# Patient Record
Sex: Female | Born: 1955
Health system: Southern US, Community
[De-identification: ages and names within clinical notes are randomized; demographics above are authoritative.]

## PROBLEM LIST (undated history)

## (undated) DIAGNOSIS — R7303 Prediabetes: Secondary | ICD-10-CM

## (undated) DIAGNOSIS — I10 Essential (primary) hypertension: Secondary | ICD-10-CM

## (undated) DIAGNOSIS — T8859XA Other complications of anesthesia, initial encounter: Secondary | ICD-10-CM

## (undated) DIAGNOSIS — Z8489 Family history of other specified conditions: Secondary | ICD-10-CM

## (undated) DIAGNOSIS — K219 Gastro-esophageal reflux disease without esophagitis: Secondary | ICD-10-CM

## (undated) DIAGNOSIS — Z87442 Personal history of urinary calculi: Secondary | ICD-10-CM

## (undated) DIAGNOSIS — M1990 Unspecified osteoarthritis, unspecified site: Secondary | ICD-10-CM

## (undated) DIAGNOSIS — E669 Obesity, unspecified: Secondary | ICD-10-CM

## (undated) DIAGNOSIS — E785 Hyperlipidemia, unspecified: Secondary | ICD-10-CM

## (undated) DIAGNOSIS — R519 Headache, unspecified: Secondary | ICD-10-CM

## (undated) DIAGNOSIS — T7840XA Allergy, unspecified, initial encounter: Secondary | ICD-10-CM

## (undated) DIAGNOSIS — Z803 Family history of malignant neoplasm of breast: Secondary | ICD-10-CM

## (undated) DIAGNOSIS — M4800 Spinal stenosis, site unspecified: Secondary | ICD-10-CM

## (undated) DIAGNOSIS — J3089 Other allergic rhinitis: Secondary | ICD-10-CM

## (undated) HISTORY — DX: Family history of malignant neoplasm of breast: Z80.3

## (undated) HISTORY — DX: Unspecified osteoarthritis, unspecified site: M19.90

## (undated) HISTORY — DX: Other allergic rhinitis: J30.89

## (undated) HISTORY — PX: VAGINA SURGERY: SHX829

## (undated) HISTORY — DX: Allergy, unspecified, initial encounter: T78.40XA

## (undated) HISTORY — PX: OTHER SURGICAL HISTORY: SHX169

## (undated) HISTORY — DX: Spinal stenosis, site unspecified: M48.00

## (undated) HISTORY — PX: BREAST BIOPSY: SHX20

## (undated) HISTORY — DX: Obesity, unspecified: E66.9

## (undated) HISTORY — DX: Hyperlipidemia, unspecified: E78.5

## (undated) HISTORY — DX: Gastro-esophageal reflux disease without esophagitis: K21.9

## (undated) HISTORY — PX: COLONOSCOPY: SHX174

## (undated) HISTORY — PX: APPENDECTOMY: SHX54

---

## 2011-11-23 DIAGNOSIS — D481 Neoplasm of uncertain behavior of connective and other soft tissue: Secondary | ICD-10-CM | POA: Insufficient documentation

## 2015-07-16 ENCOUNTER — Ambulatory Visit (INDEPENDENT_AMBULATORY_CARE_PROVIDER_SITE_OTHER): Payer: BC Managed Care – PPO | Admitting: Family Medicine

## 2015-07-16 ENCOUNTER — Encounter: Payer: Self-pay | Admitting: Family Medicine

## 2015-07-16 VITALS — BP 144/78 | HR 63 | Ht 64.25 in | Wt 199.0 lb

## 2015-07-16 DIAGNOSIS — Z1231 Encounter for screening mammogram for malignant neoplasm of breast: Secondary | ICD-10-CM | POA: Insufficient documentation

## 2015-07-16 DIAGNOSIS — M4800 Spinal stenosis, site unspecified: Secondary | ICD-10-CM

## 2015-07-16 DIAGNOSIS — Z803 Family history of malignant neoplasm of breast: Secondary | ICD-10-CM

## 2015-07-16 DIAGNOSIS — E669 Obesity, unspecified: Secondary | ICD-10-CM | POA: Diagnosis not present

## 2015-07-16 DIAGNOSIS — K219 Gastro-esophageal reflux disease without esophagitis: Secondary | ICD-10-CM

## 2015-07-16 DIAGNOSIS — M48062 Spinal stenosis, lumbar region with neurogenic claudication: Secondary | ICD-10-CM

## 2015-07-16 DIAGNOSIS — E785 Hyperlipidemia, unspecified: Secondary | ICD-10-CM | POA: Diagnosis not present

## 2015-07-16 DIAGNOSIS — M4806 Spinal stenosis, lumbar region: Secondary | ICD-10-CM

## 2015-07-16 DIAGNOSIS — M1990 Unspecified osteoarthritis, unspecified site: Secondary | ICD-10-CM

## 2015-07-16 DIAGNOSIS — J3089 Other allergic rhinitis: Secondary | ICD-10-CM

## 2015-07-16 NOTE — Progress Notes (Signed)
Marjory Sneddon, D.O. Primary care at [facility]:    Chief Complaint  Patient presents with  . Establish Care   New pt, here to establish care.   HPI: Paula Conway is a pleasant 60 y.o. female who presents to [place] [facility] today To become established.  Patient recently retired from [employer] as a Engineer, manufacturing.   She is on her second marriage, for 11 years.  She is the mother of 2, and grandmother of 2.  She has a mini Daschund- who she states is her spoiled child.  Her husband is the chief of police at [employer].   Patient recently moved here from [facility] and needs a new primary care physician in the area.  She is not sure what a normal blood pressure is or what she can do to prevent it from going up.  She is not sure of what to eat and what not to eat as well. Desires to lose wt.  Realizes it makes her back pain and knee/jt pain worse   Last PAP- 2012- N  Dr Lawerance Cruel- ortho/ PMR Doc in [place], [place]    Past Medical History  Diagnosis Date  . Allergy   . Arthritis   . GERD (gastroesophageal reflux disease)   . Hyperlipidemia   . Spinal stenosis       Past Surgical History  Procedure Laterality Date  . Appendectomy    . Core biopsy        Family History  Problem Relation Age of Onset  . Cancer Mother     breast  . Diabetes Father   . Hypertension Father   . Heart disease Father       History  Drug Use No  ,    History  Alcohol Use  . Yes    Comment: socially  ,    History  Smoking status  . Former Smoker  . Quit date: 01/11/1975  Smokeless tobacco  . Never Used  ,     History  Sexual Activity  . Sexual Activity: Yes      Patient's Medications  New Prescriptions   No medications on file  Previous Medications   GABAPENTIN (NEURONTIN) 600 MG TABLET    Take 300 mg by mouth daily as needed.   GLUCOS-CHOND-HYAL AC-CA FRUCTO (MOVE FREE JOINT HEALTH ADVANCE PO)    Take 1  tablet by mouth daily.   OVER THE COUNTER MEDICATION    Take 1 capsule by mouth daily. Alpha + CRS   OVER THE COUNTER MEDICATION    Take 1 capsule by mouth daily. Food Nutrient Complex   OVER THE COUNTER MEDICATION    Take 1 tablet by mouth daily. Essential Oil Omega Complex  Modified Medications   No medications on file  Discontinued Medications   No medications on file     Review of patient's allergies indicates no known allergies. Outpatient Encounter Prescriptions as of 07/16/2015  Medication Sig Note  . gabapentin (NEURONTIN) 600 MG tablet Take 300 mg by mouth daily as needed. 07/16/2015: Received from: [place]: Take 300 mg by mouth.  . Glucos-Chond-Hyal Ac-Ca Fructo (MOVE FREE JOINT HEALTH ADVANCE PO) Take 1 tablet by mouth daily.   Marland Kitchen OVER THE COUNTER MEDICATION Take 1 capsule by mouth daily. Alpha + CRS   . OVER THE COUNTER MEDICATION Take 1 capsule by mouth daily. Food Nutrient Complex   .  OVER THE COUNTER MEDICATION Take 1 tablet by mouth daily. Essential Oil Omega Complex    No facility-administered encounter medications on file as of 07/16/2015.      There are no preventive care reminders to display for this patient.    There is no immunization history on file for this patient.   <no information>   Fall Risk  07/16/2015  Falls in the past year? No     Depression screen PHQ 2/9 07/16/2015  Decreased Interest 0  Down, Depressed, Hopeless 0  PHQ - 2 Score 0      Review of Systems:   ( Completed via Adult Medical History Intake form today ) General:   Denies fever, chills, appetite changes, unexplained weight loss.  Optho/Auditory:   Denies visual changes, blurred vision/LOV, ringing in ears/ diff hearing Respiratory:   Denies SOB, DOE, cough, wheezing.  Cardiovascular:   Denies chest pain, palpitations, new onset peripheral edema  Gastrointestinal:   Denies nausea, vomiting, diarrhea.  Genitourinary:    Denies dysuria, increased frequency,  flank pain.  Endocrine:     Denies hot or cold intolerance, polyuria, polydipsia. Musculoskeletal:  Denies unexplained myalgias, joint swelling, arthralgias, gait problems.  Skin:  Denies rash, suspicious lesions or new/ changes in moles Neurological:    Denies dizziness, syncope, unexplained weakness, lightheadedness, numbness  Psychiatric/Behavioral:   Denies mood changes, suicidal or homicidal ideations, hallucinations    Objective:   Blood pressure 144/78, pulse 63, height 5' 4.25" (1.632 m), weight 199 lb (90.266 kg). Body mass index is 33.89 kg/(m^2).  General: Well Developed, well nourished, and in no acute distress.  Neuro: Alert and oriented x3, extra-ocular muscles intact, sensation grossly intact.  HEENT: Normocephalic, atraumatic, pupils equal round reactive to light, neck supple, no gross masses, no carotid bruits, no JVD apprec Skin: no gross suspicious lesions or rashes  Cardiac: Regular rate and rhythm, no murmurs rubs or gallops.  Respiratory: Essentially clear to auscultation bilaterally. Not using accessory muscles, speaking in full sentences.  Abdominal: Soft, not grossly distended Musculoskeletal: Ambulates w/o diff, FROM * 4 ext.  Vasc: less 2 sec cap RF, warm and pink  Psych:  No HI/SI, judgement and insight good.    Impression and Recommendations:    The patient was counselled, risk factors were discussed, anticipatory guidance given. Pt was in the office today for 40+ minutes, with over 50% time spent in face to face counseling of various medical concerns and in coordination of care  1. Hyperlipidemia -Counseling done re: lifestyle and dietary modification - COMPLETE METABOLIC PANEL WITH GFR; Future - TSH; Future - Hemoglobin A1c; Future - Lipid panel; Future - Vitamin B12; Future - VITAMIN D 25 Hydroxy (Vit-D Deficiency, Fractures); Future  2. Obesity -Counseling done re: lifestyle and dietary modification - COMPLETE METABOLIC PANEL WITH GFR;  Future - TSH; Future - Hemoglobin A1c; Future - Lipid panel; Future - Vitamin B12; Future - VITAMIN D 25 Hydroxy (Vit-D Deficiency, Fractures); Future  3. Gastroesophageal reflux disease, esophagitis presence not specified Not needing meds, may use OTC occ prn - CBC with Differential/Platelet; Future - COMPLETE METABOLIC PANEL WITH GFR; Future   4. Family history of breast cancer in Mother - CBC with Differential/Platelet; Future 5. Encounter for screening mammogram for breast cancer Importance d/c pt - MM DIGITAL SCREENING BILATERAL; Future  6. Arthritis txed by ortho/ PM&R doc in [place], [place] - CBC with Differential/Platelet; Future - COMPLETE METABOLIC PANEL WITH GFR; Future - TSH; Future - Hemoglobin A1c; Future - Lipid  panel; Future - Vitamin B12; Future - VITAMIN D 25 Hydroxy (Vit-D Deficiency, Fractures); Future  7. Spinal stenosis, lumbar region, with neurogenic claudication - COMPLETE METABOLIC PANEL WITH GFR; Future - TSH; Future - Vitamin B12; Future - VITAMIN D 25 Hydroxy (Vit-D Deficiency, Fractures); Future Please see AVS handed out to patient at the end of our visit for further patient instructions/ counseling done pertaining to today's office visit.  Gross side effects, risk and benefits, and alternatives of medications discussed with patient.  Patient is aware that all medications have potential side effects and we are unable to predict every sideeffect or drug-drug interaction that may occur.  Expresses verbal understanding and consents to current therapy plan and treatment regiment.  Note: This document was prepared using Dragon voice recognition software and may include unintentional dictation errors.

## 2015-07-16 NOTE — Patient Instructions (Addendum)
Top Ten Foods for Health  1. Water Drink at least 8 to 12 cups of water daily. Consume half of your body weight in pounds, is the amount of water in ounces to drink daily.  Ie: a 200lb person = 100 oz water daily  2. Dark Green Vegetables Eat dark green vegetables at least three to four times a week. Good options include broccoli, peppers, brussel sprouts and leafy greens like kale and spinach.  3. Whole Grains Whole grains should be included in your diet at least two to three times daily. Look for whole wheat flour, rye, oatmeal, barley, amaranth, quinoa or a multigrain. A good source of fiber includes 3 to 4 grams of fiber per serving. A great source has 5 or more grams of fiber per serving.  4. Beans and Lentils Try to eat a bean-based meal at least once a week. Try to add legumes, including beans and lentils, to soups, stews, casseroles, salads and dips or eat them plain.  5. Fish Try to eat two to three serving of fish a week. A serving consists of 3 to 4 ounces of cooked fish. Good choices are salmon, trout, herring, bluefish, sardines and tuna.  6. Berries Include two to four servings of fruit in your diet each day. Try to eat berries such as raspberries, blueberries, blackberries and strawberries.  7. Winter Squash Eat butternut and acorn squash as well as other richly pigmented dark orange and green colored vegetables like sweet potato, cantaloupe and mango.  8. Soy 25 grams of soy protein a day is recommended as part of a low-fat diet to help lower cholesterol levels. Try tofu, soymilk, edamame soybeans, tempeh and texturized vegetable protein (TVP).  9. Flaxseed, Nuts and Seeds Add 1 to 2 tablespoons of ground flaxseed or other seeds to food each day or include a moderate amount of nuts - 1/4 cup - in your daily diet.  10. Organic Yogurt Men and women between 38 and 70 years of age need 1000 milligrams of calcium a day and 1200 milligrams if 57 or older. Eat  calcium-rich foods such as nonfat or low-fat dairy products three to four times a day. Include organic choices.         Back Exercises The following exercises strengthen the muscles that help to support the back. They also help to keep the lower back flexible. Doing these exercises can help to prevent back pain or lessen existing pain. If you have back pain or discomfort, try doing these exercises 2-3 times each day or as told by your health care provider. When the pain goes away, do them once each day, but increase the number of times that you repeat the steps for each exercise (do more repetitions). If you do not have back pain or discomfort, do these exercises once each day or as told by your health care provider. EXERCISES Single Knee to Chest Repeat these steps 3-5 times for each leg: 1. Lie on your back on a firm bed or the floor with your legs extended. 2. Bring one knee to your chest. Your other leg should stay extended and in contact with the floor. 3. Hold your knee in place by grabbing your knee or thigh. 4. Pull on your knee until you feel a gentle stretch in your lower back. 5. Hold the stretch for 10-30 seconds. 6. Slowly release and straighten your leg. Pelvic Tilt Repeat these steps 5-10 times: 1. Lie on your back on a  firm bed or the floor with your legs extended. 2. Bend your knees so they are pointing toward the ceiling and your feet are flat on the floor. 3. Tighten your lower abdominal muscles to press your lower back against the floor. This motion will tilt your pelvis so your tailbone points up toward the ceiling instead of pointing to your feet or the floor. 4. With gentle tension and even breathing, hold this position for 5-10 seconds. Cat-Cow Repeat these steps until your lower back becomes more flexible: 1. Get into a hands-and-knees position on a firm surface. Keep your hands under your shoulders, and keep your knees under your hips. You may place padding  under your knees for comfort. 2. Let your head hang down, and point your tailbone toward the floor so your lower back becomes rounded like the back of a cat. 3. Hold this position for 5 seconds. 4. Slowly lift your head and point your tailbone up toward the ceiling so your back forms a sagging arch like the back of a cow. 5. Hold this position for 5 seconds. Press-Ups Repeat these steps 5-10 times: 1. Lie on your abdomen (face-down) on the floor. 2. Place your palms near your head, about shoulder-width apart. 3. While you keep your back as relaxed as possible and keep your hips on the floor, slowly straighten your arms to raise the top half of your body and lift your shoulders. Do not use your back muscles to raise your upper torso. You may adjust the placement of your hands to make yourself more comfortable. 4. Hold this position for 5 seconds while you keep your back relaxed. 5. Slowly return to lying flat on the floor. Bridges Repeat these steps 10 times: 1. Lie on your back on a firm surface. 2. Bend your knees so they are pointing toward the ceiling and your feet are flat on the floor. 3. Tighten your buttocks muscles and lift your buttocks off of the floor until your waist is at almost the same height as your knees. You should feel the muscles working in your buttocks and the back of your thighs. If you do not feel these muscles, slide your feet 1-2 inches farther away from your buttocks. 4. Hold this position for 3-5 seconds. 5. Slowly lower your hips to the starting position, and allow your buttocks muscles to relax completely. If this exercise is too easy, try doing it with your arms crossed over your chest. Abdominal Crunches Repeat these steps 5-10 times: 1. Lie on your back on a firm bed or the floor with your legs extended. 2. Bend your knees so they are pointing toward the ceiling and your feet are flat on the floor. 3. Cross your arms over your chest. 4. Tip your chin slightly  toward your chest without bending your neck. 5. Tighten your abdominal muscles and slowly raise your trunk (torso) high enough to lift your shoulder blades a tiny bit off of the floor. Avoid raising your torso higher than that, because it can put too much stress on your low back and it does not help to strengthen your abdominal muscles. 6. Slowly return to your starting position. Back Lifts Repeat these steps 5-10 times: 1. Lie on your abdomen (face-down) with your arms at your sides, and rest your forehead on the floor. 2. Tighten the muscles in your legs and your buttocks. 3. Slowly lift your chest off of the floor while you keep your hips pressed to the floor. Keep the  back of your head in line with the curve in your back. Your eyes should be looking at the floor. 4. Hold this position for 3-5 seconds. 5. Slowly return to your starting position. SEEK MEDICAL CARE IF:  Your back pain or discomfort gets much worse when you do an exercise.  Your back pain or discomfort does not lessen within 2 hours after you exercise. If you have any of these problems, stop doing these exercises right away. Do not do them again unless your health care provider says that you can. SEEK IMMEDIATE MEDICAL CARE IF:  You develop sudden, severe back pain. If this happens, stop doing the exercises right away. Do not do them again unless your health care provider says that you can.   This information is not intended to replace advice given to you by your health care provider. Make sure you discuss any questions you have with your health care provider.   Document Released: 02/04/2004 Document Revised: 09/17/2014 Document Reviewed: 02/20/2014 Elsevier Interactive Patient Education [employer].

## 2015-07-20 ENCOUNTER — Encounter: Payer: Self-pay | Admitting: Family Medicine

## 2015-07-20 DIAGNOSIS — M159 Polyosteoarthritis, unspecified: Secondary | ICD-10-CM | POA: Insufficient documentation

## 2015-07-20 DIAGNOSIS — Z803 Family history of malignant neoplasm of breast: Secondary | ICD-10-CM

## 2015-07-20 DIAGNOSIS — E785 Hyperlipidemia, unspecified: Secondary | ICD-10-CM | POA: Insufficient documentation

## 2015-07-20 DIAGNOSIS — E669 Obesity, unspecified: Secondary | ICD-10-CM

## 2015-07-20 DIAGNOSIS — K219 Gastro-esophageal reflux disease without esophagitis: Secondary | ICD-10-CM | POA: Insufficient documentation

## 2015-07-20 DIAGNOSIS — M4800 Spinal stenosis, site unspecified: Secondary | ICD-10-CM | POA: Insufficient documentation

## 2015-07-20 DIAGNOSIS — J3089 Other allergic rhinitis: Secondary | ICD-10-CM

## 2015-07-20 HISTORY — DX: Other allergic rhinitis: J30.89

## 2015-07-20 HISTORY — DX: Family history of malignant neoplasm of breast: Z80.3

## 2015-07-20 HISTORY — DX: Obesity, unspecified: E66.9

## 2015-07-21 ENCOUNTER — Other Ambulatory Visit (INDEPENDENT_AMBULATORY_CARE_PROVIDER_SITE_OTHER): Payer: BC Managed Care – PPO

## 2015-07-21 DIAGNOSIS — K219 Gastro-esophageal reflux disease without esophagitis: Secondary | ICD-10-CM

## 2015-07-21 DIAGNOSIS — E669 Obesity, unspecified: Secondary | ICD-10-CM

## 2015-07-21 DIAGNOSIS — Z803 Family history of malignant neoplasm of breast: Secondary | ICD-10-CM

## 2015-07-21 DIAGNOSIS — M1990 Unspecified osteoarthritis, unspecified site: Secondary | ICD-10-CM

## 2015-07-21 DIAGNOSIS — M4800 Spinal stenosis, site unspecified: Secondary | ICD-10-CM

## 2015-07-21 DIAGNOSIS — M48062 Spinal stenosis, lumbar region with neurogenic claudication: Secondary | ICD-10-CM

## 2015-07-21 DIAGNOSIS — E785 Hyperlipidemia, unspecified: Secondary | ICD-10-CM | POA: Diagnosis not present

## 2015-07-22 LAB — COMPLETE METABOLIC PANEL WITH GFR
ALBUMIN: 4.3 g/dL (ref 3.6–5.1)
ALT: 19 U/L (ref 6–29)
AST: 17 U/L (ref 10–35)
Alkaline Phosphatase: 65 U/L (ref 33–130)
BUN: 18 mg/dL (ref 7–25)
CO2: 26 mmol/L (ref 20–31)
Calcium: 9.4 mg/dL (ref 8.6–10.4)
Chloride: 104 mmol/L (ref 98–110)
Creat: 0.9 mg/dL (ref 0.50–0.99)
GFR, EST NON AFRICAN AMERICAN: 70 mL/min (ref >=60)
GFR, Est African American: 80 mL/min (ref >=60)
GLUCOSE: 99 mg/dL (ref 65–99)
Potassium: 4.7 mmol/L (ref 3.5–5.3)
SODIUM: 143 mmol/L (ref 135–146)
TOTAL PROTEIN: 6.9 g/dL (ref 6.1–8.1)
Total Bilirubin: 0.4 mg/dL (ref 0.2–1.2)

## 2015-07-22 LAB — CBC WITH DIFFERENTIAL/PLATELET
BASOS ABS: 72 {cells}/uL (ref 0–200)
BASOS PCT: 1 %
EOS ABS: 144 {cells}/uL (ref 15–500)
EOS PCT: 2 %
HCT: 43.8 % (ref 35.0–45.0)
HEMOGLOBIN: 14.6 g/dL (ref 11.7–15.5)
LYMPHS ABS: 2448 {cells}/uL (ref 850–3900)
Lymphocytes Relative: 34 %
MCH: 31.1 pg (ref 27.0–33.0)
MCHC: 33.3 g/dL (ref 32.0–36.0)
MCV: 93.2 fL (ref 80.0–100.0)
MONOS PCT: 6 %
MPV: 12.4 fL (ref 7.5–12.5)
Monocytes Absolute: 432 cells/uL (ref 200–950)
NEUTROS ABS: 4104 {cells}/uL (ref 1500–7800)
Neutrophils Relative %: 57 %
PLATELETS: 202 10*3/uL (ref 140–400)
RBC: 4.7 MIL/uL (ref 3.80–5.10)
RDW: 13.9 % (ref 11.0–15.0)
WBC: 7.2 10*3/uL (ref 3.8–10.8)

## 2015-07-22 LAB — HEMOGLOBIN A1C
HEMOGLOBIN A1C: 5.9 % — AB (ref <5.7)
MEAN PLASMA GLUCOSE: 123 mg/dL

## 2015-07-22 LAB — LIPID PANEL
CHOLESTEROL: 229 mg/dL — AB (ref 125–200)
HDL: 54 mg/dL (ref >=46)
LDL Cholesterol: 153 mg/dL — ABNORMAL HIGH (ref <130)
TRIGLYCERIDES: 108 mg/dL (ref <150)
Total CHOL/HDL Ratio: 4.2 Ratio (ref <=5.0)
VLDL: 22 mg/dL (ref <30)

## 2015-07-22 LAB — TSH: TSH: 4.67 m[IU]/L — AB

## 2015-07-22 LAB — VITAMIN D 25 HYDROXY (VIT D DEFICIENCY, FRACTURES): Vit D, 25-Hydroxy: 30 ng/mL (ref 30–100)

## 2015-07-22 LAB — VITAMIN B12: Vitamin B-12: 884 pg/mL (ref 200–1100)

## 2015-07-30 ENCOUNTER — Encounter: Payer: Self-pay | Admitting: Family Medicine

## 2015-07-30 ENCOUNTER — Ambulatory Visit (INDEPENDENT_AMBULATORY_CARE_PROVIDER_SITE_OTHER): Payer: BC Managed Care – PPO | Admitting: Family Medicine

## 2015-07-30 VITALS — BP 118/74 | HR 68 | Resp 17 | Wt 198.0 lb

## 2015-07-30 DIAGNOSIS — E7800 Pure hypercholesterolemia, unspecified: Secondary | ICD-10-CM | POA: Diagnosis not present

## 2015-07-30 DIAGNOSIS — R7989 Other specified abnormal findings of blood chemistry: Secondary | ICD-10-CM

## 2015-07-30 DIAGNOSIS — R946 Abnormal results of thyroid function studies: Secondary | ICD-10-CM | POA: Diagnosis not present

## 2015-07-30 DIAGNOSIS — R7303 Prediabetes: Secondary | ICD-10-CM | POA: Diagnosis not present

## 2015-07-30 DIAGNOSIS — E669 Obesity, unspecified: Secondary | ICD-10-CM

## 2015-07-30 NOTE — Assessment & Plan Note (Signed)
Weight loss discussed with patient she will consider going to [employer] otherwise she will return to clinic with me in 4 weeks.  Educational handouts provided

## 2015-07-30 NOTE — Assessment & Plan Note (Signed)
A1c was 5.9 in July 2017. Counseling done, occasional handouts provided. Follow-up 1 month for further counseling to see how she is doing with her healthier habits.

## 2015-07-30 NOTE — Assessment & Plan Note (Signed)
>>  ASSESSMENT AND PLAN FOR ELEVATED LDL CHOLESTEROL LEVEL WRITTEN ON 07/30/2015  1:52 PM BY OPALSKI, DEBORAH, DO  Counseling done, educational handouts provided. Told patient needs to move more

## 2015-07-30 NOTE — Patient Instructions (Signed)
Go to www.heart.org to read more about cholesterol and getting healthy and/or go to www.diabetes.org to learn more about preventing onset and progression to diabetes and how to treat your glucose intolerance / prediabetes    Risk factors for prediabetes and type 2 diabetes Researchers don't fully understand why some people develop prediabetes and type 2 diabetes and others don't.  It's clear that certain factors increase the risk, however, including:  Weight. The more fatty tissue you have, the more resistant your cells become to insulin.  Inactivity. The less active you are, the greater your risk. Physical activity helps you control your weight, uses up glucose as energy and makes your cells more sensitive to insulin.  Family history. Your risk increases if a parent or sibling has type 2 diabetes.  Race. Although it's unclear why, people of certain races - including blacks, Hispanics, American Indians and Asian-Americans - are at higher risk.  Age. Your risk increases as you get older. This may be because you tend to exercise less, lose muscle mass and gain weight as you age. But type 2 diabetes is also increasing dramatically among children, adolescents and younger adults.  Gestational diabetes. If you developed gestational diabetes when you were pregnant, your risk of developing prediabetes and type 2 diabetes later increases. If you gave birth to a baby weighing more than 9 pounds (4 kilograms), you're also at risk of type 2 diabetes.  Polycystic ovary syndrome. For women, having polycystic ovary syndrome - a common condition characterized by irregular menstrual periods, excess hair growth and obesity - increases the risk of diabetes.  High blood pressure. Having blood pressure over 140/90 millimeters of mercury (mm Hg) is linked to an increased risk of type 2 diabetes.  Abnormal cholesterol and triglyceride levels. If you have low levels of high-density lipoprotein (HDL), or "good,"  cholesterol, your risk of type 2 diabetes is higher. Triglycerides are another type of fat carried in the blood. People with high levels of triglycerides have an increased risk of type 2 diabetes. Your doctor can let you know what your cholesterol and triglyceride levels are.    A good guide to good carbs: The glycemic index ---If you have diabetes, or at risk for diabetes, you know all too well that when you eat carbohydrates, your blood sugar goes up. The total amount of carbs you consume at a meal or in a snack mostly determines what your blood sugar will do. But the food itself also plays a role. A serving of white rice has almost the same effect as eating pure table sugar - a quick, high spike in blood sugar. A serving of lentils has a slower, smaller effect.  ---Picking good sources of carbs can help you control your blood sugar and your weight. Even if you don't have diabetes, eating healthier carbohydrate-rich foods can help ward off a host of chronic conditions, from heart disease to various cancers to, well, diabetes.  ---One way to choose foods is with the glycemic index (GI). This tool measures how much a food boosts blood sugar.  The glycemic index rates the effect of a specific amount of a food on blood sugar compared with the same amount of pure glucose. A food with a glycemic index of 28 boosts blood sugar only 28% as much as pure glucose. One with a GI of 95 acts like pure glucose.  High glycemic foods result in a quick spike in insulin and blood sugar (also known as blood glucose). Low glycemic  foods have a slower, smaller effect.   Using the glycemic index Using the glycemic index is easy: choose foods in the low GI category instead of those in the high GI category (see below), and go easy on those in between. Low glycemic index (GI of 55 or less): Most fruits and vegetables, beans, minimally processed grains, pasta, low-fat dairy foods, and nuts.  Moderate glycemic index (GI 56 to  69): White and sweet potatoes, corn, white rice, couscous, breakfast cereals such as Cream of Wheat and Mini Wheats.  High glycemic index (GI of 70 or higher): White bread, rice cakes, most crackers, bagels, cakes, doughnuts, croissants, most packaged breakfast cereals. You can see the values for 100 commons foods and get links to more at www.health.CheapToothpicks.si.  Swaps for lowering glycemic index  Instead of this high-glycemic index food Eat this lower-glycemic index food  White rice Brown rice or converted rice  Instant oatmeal Steel-cut oats  Cornflakes Bran flakes  Baked potato Pasta, bulgur  White bread Whole-grain bread  Corn Peas or leafy greens      Guidelines for a Low Cholesterol, Low Saturated Fat Diet   Fats - Limit total intake of fats and oils. - Avoid butter, stick margarine, shortening, lard, palm and coconut oils. - Limit mayonnaise, salad dressings, gravies and sauces, unless they are homemade with low-fat ingredients. - Limit chocolate. - Choose low-fat and nonfat products, such as low-fat mayonnaise, low-fat or non-hydrogenated peanut butter, low-fat or fat-free salad dressings and nonfat gravy. - Use vegetable oil, such as canola or olive oil. - Look for margarine that does not contain trans fatty acids. - Use nuts in moderate amounts. - Read ingredient labels carefully to determine both amount and type of fat present in foods. Limit saturated and trans fats! - Avoid high-fat processed and convenience foods.  Meats and Meat Alternatives - Choose fish, chicken, [place] and lean meats. - Use dried beans, peas, lentils and tofu. - Limit egg yolks to three to four per week. - If you eat red meat, limit to no more than three servings per week and choose loin or round cuts. - Avoid fatty meats, such as bacon, sausage, franks, luncheon meats and ribs. - Avoid all organ meats, including liver.  Dairy - Choose nonfat or low-fat milk, yogurt and cottage  cheese. - Most cheeses are high in fat. Choose cheeses made from non-fat milk, such as mozzarella and ricotta cheese. - Choose light or fat-free cream cheese and sour cream. - Avoid cream and sauces made with cream.  Fruits and Vegetables - Eat a wide variety of fruits and vegetables. - Use lemon juice, vinegar or "mist" olive oil on vegetables. - Avoid adding sauces, fat or oil to vegetables.  Breads, Cereals and Grains - Choose whole-grain breads, cereals, pastas and rice. - Avoid high-fat snack foods, such as granola, cookies, pies, pastries, doughnuts and croissants.  Cooking Tips - Avoid deep fried foods. - Trim visible fat off meats and remove skin from poultry before cooking. - Bake, broil, boil, poach or roast poultry, fish and lean meats. - Drain and discard fat that drains out of meat as you cook it. - Add little or no fat to foods. - Use vegetable oil sprays to grease pans for cooking or baking. - Steam vegetables. - Use herbs or no-oil marinades to flavor foods.

## 2015-07-30 NOTE — Assessment & Plan Note (Signed)
  Told patient that we can give her low-dose medicine or since she is asymptomatic we can wait and recheck in 6 months along with the other labs.  She wishes to wait 6 months.  She understands that this may hinder her ability to lose weight or feel energetic.

## 2015-07-30 NOTE — Assessment & Plan Note (Signed)
Counseling done, educational handouts provided. Told patient needs to move more

## 2015-07-30 NOTE — Progress Notes (Signed)
Subjective:    Chief Complaint  Patient presents with  . Follow-up    HPI: Paula Conway is a 60 y.o. female who presents to [place] at [facility] today  To review lab work. She is not looking forward to this and does not want to go on any medications.    Past Medical History  Diagnosis Date  . Allergy   . Arthritis   . GERD (gastroesophageal reflux disease)   . Hyperlipidemia   . Spinal stenosis   . Obesity 07/20/2015  . Family history of breast cancer in Mother 07/20/2015    Mother was diagnosed at 61 years old and died age 72 of breast cancer   . Environmental and seasonal allergies 07/20/2015     Past Surgical History  Procedure Laterality Date  . Appendectomy    . Core biopsy       Family History  Problem Relation Age of Onset  . Cancer Mother     breast  . Diabetes Father   . Hypertension Father   . Heart disease Father      History  Drug Use No  ,  History  Alcohol Use  . Yes    Comment: socially  ,  History  Smoking status  . Former Smoker  . Quit date: 01/11/1975  Smokeless tobacco  . Never Used  ,  History  Sexual Activity  . Sexual Activity: Yes      Current Outpatient Prescriptions on File Prior to Visit  Medication Sig Dispense Refill  . gabapentin (NEURONTIN) 600 MG tablet Take 300 mg by mouth daily as needed.    . Glucos-Chond-Hyal Ac-Ca Fructo (MOVE FREE JOINT HEALTH ADVANCE PO) Take 1 tablet by mouth daily.    Marland Kitchen OVER THE COUNTER MEDICATION Take 1 capsule by mouth daily. Alpha + CRS    . OVER THE COUNTER MEDICATION Take 1 capsule by mouth daily. Food Nutrient Complex    . OVER THE COUNTER MEDICATION Take 1 tablet by mouth daily. Essential Oil Omega Complex     No current facility-administered medications on file prior to visit.    No Known Allergies    Review of Systems:  ( Completed via adult medical history intake form today ) General:  Denies fever, chills, appetite changes, unexplained  weight loss.  Respiratory: Denies SOB, DOE, cough, wheezing.  Cardiovascular: Denies chest pain, palpitations.  Gastrointestinal: Denies nausea, vomiting, diarrhea, abdominal pain.  Genitourinary: Denies dysuria, increased frequency, flank pain. Endocrine: Denies hot or cold intolerance, polyuria, polydipsia. Musculoskeletal: Denies myalgias, back pain, joint swelling, arthralgias, gait problems.  Skin: Denies pallor, rash, suspicious lesions.  Neurological: Denies dizziness, seizures, syncope, unexplained weakness, lightheadedness, numbness and headaches.  Psychiatric/Behavioral: Denies mood changes, suicidal or homicidal ideations, hallucinations, sleep disturbances.    Objective:    Blood pressure 118/74, pulse 68, resp. rate 17, weight 198 lb (89.812 kg), SpO2 96 %. Body mass index is 33.72 kg/(m^2). General: Well Developed, well nourished, and in no acute distress.  HEENT: Normocephalic, atraumatic, pupils equal round reactive to light, neck supple, No carotid bruits no JVD Skin: Warm and dry, cap RF less 2 sec Cardiac: Regular rate and rhythm, S1, S2 WNL's, no murmurs rubs or gallops Respiratory: ECTA B/L, Not using accessory muscles, speaking in full sentences. NeuroM-Sk: Ambulates w/o assistance, moves ext * 4 w/o difficulty, sensation grossly intact.  Psych: A and O *3, judgement and insight good.       Recent  Results (from the past 2160 hour(s))  CBC with Differential/Platelet     Status: None   Collection Time: 07/21/15  8:22 AM  Result Value Ref Range   WBC 7.2 3.8 - 10.8 K/uL   RBC 4.70 3.80 - 5.10 MIL/uL   Hemoglobin 14.6 11.7 - 15.5 g/dL   HCT 43.8 35.0 - 45.0 %   MCV 93.2 80.0 - 100.0 fL   MCH 31.1 27.0 - 33.0 pg   MCHC 33.3 32.0 - 36.0 g/dL   RDW 13.9 11.0 - 15.0 %   Platelets 202 140 - 400 K/uL   MPV 12.4 7.5 - 12.5 fL   Neutro Abs 4104 1500 - 7800 cells/uL   Lymphs Abs 2448 850 - 3900 cells/uL   Monocytes Absolute 432 200 - 950 cells/uL   Eosinophils  Absolute 144 15 - 500 cells/uL   Basophils Absolute 72 0 - 200 cells/uL   Neutrophils Relative % 57 %   Lymphocytes Relative 34 %   Monocytes Relative 6 %   Eosinophils Relative 2 %   Basophils Relative 1 %   Smear Review Criteria for review not met     Comment: ** Please note change in unit of measure and reference range(s). **  COMPLETE METABOLIC PANEL WITH GFR     Status: None   Collection Time: 07/21/15  8:22 AM  Result Value Ref Range   Sodium 143 135 - 146 mmol/L   Potassium 4.7 3.5 - 5.3 mmol/L   Chloride 104 98 - 110 mmol/L   CO2 26 20 - 31 mmol/L   Glucose, Bld 99 65 - 99 mg/dL   BUN 18 7 - 25 mg/dL   Creat 0.90 0.50 - 0.99 mg/dL    Comment:   For patients > or = 60 years of age: The upper reference limit for Creatinine is approximately 13% higher for people identified as African-American.      Total Bilirubin 0.4 0.2 - 1.2 mg/dL   Alkaline Phosphatase 65 33 - 130 U/L   AST 17 10 - 35 U/L   ALT 19 6 - 29 U/L   Total Protein 6.9 6.1 - 8.1 g/dL   Albumin 4.3 3.6 - 5.1 g/dL   Calcium 9.4 8.6 - 10.4 mg/dL   GFR, Est African American 80 >=60 mL/min   GFR, Est Non African American 70 >=60 mL/min  TSH     Status: Abnormal   Collection Time: 07/21/15  8:22 AM  Result Value Ref Range   TSH 4.67 (H) mIU/L    Comment:   Reference Range   > or = 20 Years  0.40-4.50   Pregnancy Range First trimester  0.26-2.66 Second trimester 0.55-2.73 Third trimester  0.43-2.91     Hemoglobin A1c     Status: Abnormal   Collection Time: 07/21/15  8:22 AM  Result Value Ref Range   Hgb A1c MFr Bld 5.9 (H) <5.7 %    Comment:   For someone without known diabetes, a hemoglobin A1c value between 5.7% and 6.4% is consistent with prediabetes and should be confirmed with a follow-up test.   For someone with known diabetes, a value <7% indicates that their diabetes is well controlled. A1c targets should be individualized based on duration of diabetes, age, co-morbid conditions and  other considerations.   This assay result is consistent with an increased risk of diabetes.   Currently, no consensus exists regarding use of hemoglobin A1c for diagnosis of diabetes in children.      Mean  Plasma Glucose 123 mg/dL  Lipid panel     Status: Abnormal   Collection Time: 07/21/15  8:22 AM  Result Value Ref Range   Cholesterol 229 (H) 125 - 200 mg/dL   Triglycerides 108 <150 mg/dL   HDL 54 >=46 mg/dL   Total CHOL/HDL Ratio 4.2 <=5.0 Ratio   VLDL 22 <30 mg/dL   LDL Cholesterol 153 (H) <130 mg/dL    Comment:   Total Cholesterol/HDL Ratio:CHD Risk                        Coronary Heart Disease Risk Table                                        Men       Women          1/2 Average Risk              3.4        3.3              Average Risk              5.0        4.4           2X Average Risk              9.6        7.1           3X Average Risk             23.4       11.0 Use the calculated Patient Ratio above and the CHD Risk table  to determine the patient's CHD Risk.   Vitamin B12     Status: None   Collection Time: 07/21/15  8:22 AM  Result Value Ref Range   Vitamin B-12 884 200 - 1100 pg/mL  VITAMIN D 25 Hydroxy (Vit-D Deficiency, Fractures)     Status: None   Collection Time: 07/21/15  8:22 AM  Result Value Ref Range   Vit D, 25-Hydroxy 30 30 - 100 ng/mL    Comment: Vitamin D Status           25-OH Vitamin D        Deficiency                <20 ng/mL        Insufficiency         20 - 29 ng/mL        Optimal             > or = 30 ng/mL   For 25-OH Vitamin D testing on patients on D2-supplementation and patients for whom quantitation of D2 and D3 fractions is required, the QuestAssureD 25-OH VIT D, (D2,D3), LC/MS/MS is recommended: order code [id number] (patients > 2 yrs).         Impression and Recommendations:    The patient was counselled, risk factors were discussed, anticipatory guidance given.   Prediabetes A1c was 5.9 in July 2017. Counseling  done, occasional handouts provided. Follow-up 1 month for further counseling to see how she is doing with her healthier habits.  obese Weight loss discussed with patient she will consider going to Weight Watchers otherwise she will return to clinic with me in 4 weeks.  Educational handouts provided  Elevated TSH  Told patient  that we can give her low-dose medicine or since she is asymptomatic we can wait and recheck in 6 months along with the other labs.  She wishes to wait 6 months.  She understands that this may hinder her ability to lose weight or feel energetic.   Please see AVS handed out to patient at the end of our visit for further patient instructions/ counseling done pertaining to today's office visit.  Gross side effects, risk and benefits, and alternatives of medications discussed with patient.  Patient is aware that all medications have potential side effects and we are unable to predict every sideeffect or drug-drug interaction that may occur.  Expresses verbal understanding and consents to current therapy plan and treatment regiment.  Note: This document was prepared using Dragon voice recognition software and may include unintentional dictation errors.

## 2015-09-01 ENCOUNTER — Other Ambulatory Visit: Payer: Self-pay | Admitting: Family Medicine

## 2015-09-01 ENCOUNTER — Encounter: Payer: Self-pay | Admitting: Family Medicine

## 2015-09-01 ENCOUNTER — Ambulatory Visit (INDEPENDENT_AMBULATORY_CARE_PROVIDER_SITE_OTHER): Payer: BC Managed Care – PPO | Admitting: Family Medicine

## 2015-09-01 VITALS — BP 121/77 | HR 61 | Wt 194.0 lb

## 2015-09-01 DIAGNOSIS — E785 Hyperlipidemia, unspecified: Secondary | ICD-10-CM

## 2015-09-01 DIAGNOSIS — R946 Abnormal results of thyroid function studies: Secondary | ICD-10-CM

## 2015-09-01 DIAGNOSIS — Z803 Family history of malignant neoplasm of breast: Secondary | ICD-10-CM

## 2015-09-01 DIAGNOSIS — R7303 Prediabetes: Secondary | ICD-10-CM

## 2015-09-01 DIAGNOSIS — Z1231 Encounter for screening mammogram for malignant neoplasm of breast: Secondary | ICD-10-CM

## 2015-09-01 DIAGNOSIS — M4800 Spinal stenosis, site unspecified: Secondary | ICD-10-CM

## 2015-09-01 DIAGNOSIS — R7989 Other specified abnormal findings of blood chemistry: Secondary | ICD-10-CM

## 2015-09-01 DIAGNOSIS — E7800 Pure hypercholesterolemia, unspecified: Secondary | ICD-10-CM

## 2015-09-01 DIAGNOSIS — E669 Obesity, unspecified: Secondary | ICD-10-CM

## 2015-09-01 DIAGNOSIS — M1990 Unspecified osteoarthritis, unspecified site: Secondary | ICD-10-CM

## 2015-09-01 NOTE — Assessment & Plan Note (Addendum)
We will check TSH in near future, approximately 6 months from when last checked.   Patient denies fatigue or any hypothyroidism symptoms

## 2015-09-01 NOTE — Patient Instructions (Addendum)
Use stevia as a sugar substitute as much as possible.  Goals: 1 pound per week wt loss.       Risk factors for prediabetes and type 2 diabetes Researchers don't fully understand why some people develop prediabetes and type 2 diabetes and others don't.  It's clear that certain factors increase the risk, however, including:  Weight. The more fatty tissue you have, the more resistant your cells become to insulin.  Inactivity. The less active you are, the greater your risk. Physical activity helps you control your weight, uses up glucose as energy and makes your cells more sensitive to insulin.  Family history. Your risk increases if a parent or sibling has type 2 diabetes.  Race. Although it's unclear why, people of certain races - including blacks, Hispanics, American Indians and Asian-Americans - are at higher risk.  Age. Your risk increases as you get older. This may be because you tend to exercise less, lose muscle mass and gain weight as you age. But type 2 diabetes is also increasing dramatically among children, adolescents and younger adults.  Gestational diabetes. If you developed gestational diabetes when you were pregnant, your risk of developing prediabetes and type 2 diabetes later increases. If you gave birth to a baby weighing more than 9 pounds (4 kilograms), you're also at risk of type 2 diabetes.  Polycystic ovary syndrome. For women, having polycystic ovary syndrome - a common condition characterized by irregular menstrual periods, excess hair growth and obesity - increases the risk of diabetes.  High blood pressure. Having blood pressure over 140/90 millimeters of mercury (mm Hg) is linked to an increased risk of type 2 diabetes.  Abnormal cholesterol and triglyceride levels. If you have low levels of high-density lipoprotein (HDL), or "good," cholesterol, your risk of type 2 diabetes is higher. Triglycerides are another type of fat carried in the blood. People with high levels of  triglycerides have an increased risk of type 2 diabetes. Your doctor can let you know what your cholesterol and triglyceride levels are.    A good guide to good carbs: The glycemic index ---If you have diabetes, or at risk for diabetes, you know all too well that when you eat carbohydrates, your blood sugar goes up. The total amount of carbs you consume at a meal or in a snack mostly determines what your blood sugar will do. But the food itself also plays a role. A serving of white rice has almost the same effect as eating pure table sugar - a quick, high spike in blood sugar. A serving of lentils has a slower, smaller effect.  ---Picking good sources of carbs can help you control your blood sugar and your weight. Even if you don't have diabetes, eating healthier carbohydrate-rich foods can help ward off a host of chronic conditions, from heart disease to various cancers to, well, diabetes.  ---One way to choose foods is with the glycemic index (GI). This tool measures how much a food boosts blood sugar.  The glycemic index rates the effect of a specific amount of a food on blood sugar compared with the same amount of pure glucose. A food with a glycemic index of 28 boosts blood sugar only 28% as much as pure glucose. One with a GI of 95 acts like pure glucose.  High glycemic foods result in a quick spike in insulin and blood sugar (also known as blood glucose). Low glycemic foods have a slower, smaller effect.   Using the glycemic index Using the  glycemic index is easy: choose foods in the low GI category instead of those in the high GI category (see below), and go easy on those in between. Low glycemic index (GI of 55 or less): Most fruits and vegetables, beans, minimally processed grains, pasta, low-fat dairy foods, and nuts.  Moderate glycemic index (GI 56 to 69): White and sweet potatoes, corn, white rice, couscous, breakfast cereals such as Cream of Wheat and Mini Wheats.  High glycemic index  (GI of 70 or higher): White bread, rice cakes, most crackers, bagels, cakes, doughnuts, croissants, most packaged breakfast cereals. You can see the values for 100 commons foods and get links to more at www.health.CheapToothpicks.si.  Swaps for lowering glycemic index  Instead of this high-glycemic index food Eat this lower-glycemic index food  White rice Brown rice or converted rice  Instant oatmeal Steel-cut oats  Cornflakes Bran flakes  Baked potato Pasta, bulgur  White bread Whole-grain bread  Corn Peas or leafy greens

## 2015-09-01 NOTE — Assessment & Plan Note (Addendum)
>>  ASSESSMENT AND PLAN FOR HYPERLIPIDEMIA WRITTEN ON 09/01/2015  9:04 PM BY Travone Georg, DO  Patient has changed a little about her diet. Mainly focusing on low carbohydrate instead of low saturated and Transfats.  Encouraged to read handouts that I provided her last office visit with again.  >>ASSESSMENT AND PLAN FOR ELEVATED LDL CHOLESTEROL LEVEL WRITTEN ON 09/01/2015  9:17 PM BY Alida Greiner, DO  Prudent diet d/c pt

## 2015-09-01 NOTE — Assessment & Plan Note (Addendum)
Patient will continue to monitor weight and calories using my fitness pal APP.   Patient declines going to Weight Watchers and wishes to continue what she is doing. Weight loss encouraged.

## 2015-09-01 NOTE — Progress Notes (Signed)
Impression and Recommendations:    1. Hyperlipidemia   2. Prediabetes   3. Elevated LDL cholesterol level   4. Elevated TSH   5. obese   6. Spinal stenosis, unspecified spinal region   7. Encounter for screening mammogram for breast cancer   8. Family history of breast cancer in Mother   13. Arthritis     Hyperlipidemia Patient has changed a little about her diet. Mainly focusing on low carbohydrate instead of low saturated and Transfats.  Encouraged to read handouts that I provided her last office visit with again.  Arthritis Patient understands weight loss would help her joints/\arthritic pains  obese Patient will continue to monitor weight and calories using my fitness pal APP.   Patient declines going to Weight Watchers and wishes to continue what she is doing. Weight loss encouraged.  Spinal stenosis: Chronic back pain  Told patient if she is not having any pain she does not need to take the Neurontin.  Prediabetes No questions regarding new diagnosis. Patient understands what she needs to do but finds it difficult to be disciplined.  Patient is still drinking sweet tea and still goes to [employer] for late breakfast every Saturday.   Elevated LDL cholesterol level Prudent diet d/c pt  Elevated TSH We will check TSH in near future, approximately 6 months from when last checked.   Patient denies fatigue or any hypothyroidism symptoms   Orders Placed This Encounter  Procedures  . MM Digital Screening  . TSH + free T4  . Hemoglobin A1c  . Lipid panel    Patient's Medications  New Prescriptions   No medications on file  Previous Medications   CHOLECALCIFEROL (VITAMIN D3) 5000 UNITS CAPS    Take 5,000 Units by mouth daily.   GABAPENTIN (NEURONTIN) 600 MG TABLET    Take 300 mg by mouth daily as needed.   GLUCOS-CHOND-HYAL AC-CA FRUCTO (MOVE FREE JOINT HEALTH ADVANCE PO)    Take 1 tablet by mouth daily.   OVER THE COUNTER MEDICATION    Take 1 capsule by  mouth daily. Alpha + CRS   OVER THE COUNTER MEDICATION    Take 1 capsule by mouth daily. Food Nutrient Complex   OVER THE COUNTER MEDICATION    Take 1 tablet by mouth daily. Essential Oil Omega Complex  Modified Medications   No medications on file  Discontinued Medications   No medications on file    Return in about 5 months (around 02/01/2016) for Fasting blood work- for lipid panel. f/up A1c, tsh, chol, wt.  The patient was counseled, risk factors were discussed, anticipatory guidance given.  Gross side effects, risk and benefits, and alternatives of medications discussed with patient.  Patient is aware that all medications have potential side effects and we are unable to predict every side effect or drug-drug interaction that may occur.  Expresses verbal understanding and consents to current therapy plan and treatment regimen.  Please see AVS handed out to patient at the end of our visit for further patient instructions/ counseling done pertaining to today's office visit.    Note: This document was prepared using Dragon voice recognition software and may include unintentional dictation errors.   --------------------------------------------------------------------------------------------------------------------------------------------------------------------------------------------------------------------------------------------    Subjective:    CC:  Chief Complaint  Patient presents with  . Hyperglycemia    HPI: Paula Conway is a 60 y.o. female who presents to [place] at [facility] today for issues as discussed below.  She came in last office visit to review recent fasting labs. She was found to be prediabetic, had an elevated TSH, and also elevated LDL cholesterol. She was given instructions on following a prediabetes diet as well as low saturated and Transfats diet. We discussed weight loss.  She was asked to follow-up in one month from that office  visit to see how her changes in healthy lifestyle are going. Patient is very anti-medicine and wants that to be her last resort.    Using My Fitness Pal to track her foods and calories.  She is not drinking enough water, just can't. Here to discuss healthier habits.  She has been eating healthier and lost 4 lbs in past one mo.  She does the elliptical machine-  3 d/wk- 14 mins; yoga 2d/wk  Has not taken any neurontin since I last saw her-  Takes for Lower Back Pain.  Patient wasn't sure that it was helping her so she decided not to take it.  Declines need for any other medicines- symptoms well-controlled.    Wt Readings from Last 3 Encounters:  09/01/15 194 lb (88 kg)  07/30/15 198 lb (89.8 kg)  07/16/15 199 lb (90.3 kg)   BP Readings from Last 3 Encounters:  09/01/15 121/77  07/30/15 118/74  07/16/15 (!) 144/78   Pulse Readings from Last 3 Encounters:  09/01/15 61  07/30/15 68  07/16/15 63     Patient Active Problem List   Diagnosis Date Noted  . Prediabetes 07/30/2015  . Elevated LDL cholesterol level 07/30/2015  . Elevated TSH 07/30/2015  . obese 07/20/2015  . Family history of breast cancer in Mother 07/20/2015  . GERD (gastroesophageal reflux disease) 07/20/2015  . Arthritis 07/20/2015  . Hyperlipidemia 07/20/2015  . Environmental and seasonal allergies 07/20/2015  . Spinal stenosis: Chronic back pain  07/20/2015  . Encounter for screening mammogram for breast cancer 07/16/2015    Past Medical history, Surgical history, Family history, Social history, Allergies and Medications have been entered into the medical record, reviewed and changed as needed.   Allergies:  No Known Allergies  Review of Systems: No fever/ chills, night sweats, no unintended weight loss, No chest pain, or increased shortness of breath. No N/V/D.  Pertinent positives and negatives noted in HPI above    Objective:   Blood pressure 121/77, pulse 61, weight 194 lb (88 kg).   Body mass index  is 33.04 kg/m.  General: Well Developed, well nourished, appropriate for stated age.  Neuro: Alert and oriented x3, extra-ocular muscles intact, sensation grossly intact.  HEENT: Normocephalic, atraumatic, neck supple   Skin: Warm and dry, no gross rash. Cardiac: RRR, S1 S2,  no murmurs rubs or gallops.  Respiratory: ECTA B/L, Not using accessory muscles, speaking in full sentences-unlabored. Vascular:  No gross lower ext edema, cap RF less 2 sec. Psych: No SI/HI, Insight and judgement good

## 2015-09-01 NOTE — Assessment & Plan Note (Addendum)
No questions regarding new diagnosis. Patient understands what she needs to do but finds it difficult to be disciplined.  Patient is still drinking sweet tea and still goes to [employer] for late breakfast every Saturday.

## 2015-09-01 NOTE — Assessment & Plan Note (Signed)
Prudent diet d/c pt

## 2015-09-01 NOTE — Assessment & Plan Note (Signed)
Told patient if she is not having any pain she does not need to take the Neurontin.

## 2015-09-01 NOTE — Assessment & Plan Note (Signed)
Patient understands weight loss would help her joints/\arthritic pains

## 2015-09-08 ENCOUNTER — Ambulatory Visit
Admission: RE | Admit: 2015-09-08 | Discharge: 2015-09-08 | Disposition: A | Discharge status: Home / Self Care | Payer: BC Managed Care – PPO | Source: Ambulatory Visit | Attending: Family Medicine | Admitting: Family Medicine

## 2015-09-08 DIAGNOSIS — Z1231 Encounter for screening mammogram for malignant neoplasm of breast: Secondary | ICD-10-CM

## 2015-09-21 ENCOUNTER — Ambulatory Visit (INDEPENDENT_AMBULATORY_CARE_PROVIDER_SITE_OTHER): Payer: BC Managed Care – PPO | Admitting: Family Medicine

## 2015-09-21 ENCOUNTER — Encounter: Payer: Self-pay | Admitting: Family Medicine

## 2015-09-21 VITALS — BP 115/80 | HR 78 | Temp 97.5°F | Ht 64.25 in | Wt 194.9 lb

## 2015-09-21 DIAGNOSIS — E669 Obesity, unspecified: Secondary | ICD-10-CM

## 2015-09-21 DIAGNOSIS — R300 Dysuria: Secondary | ICD-10-CM | POA: Diagnosis not present

## 2015-09-21 DIAGNOSIS — R7303 Prediabetes: Secondary | ICD-10-CM

## 2015-09-21 DIAGNOSIS — R6883 Chills (without fever): Secondary | ICD-10-CM

## 2015-09-21 LAB — POCT URINALYSIS DIPSTICK
Bilirubin, UA: NEGATIVE
Blood, UA: NEGATIVE
Glucose, UA: NEGATIVE
Ketones, UA: NEGATIVE
Nitrite, UA: NEGATIVE
PROTEIN UA: NEGATIVE
UROBILINOGEN UA: 0.2
pH, UA: 6

## 2015-09-21 NOTE — Patient Instructions (Signed)
F/up if worse or no change in symptoms.    Cont to drink more water and walk daily to a goal of 39min

## 2015-09-21 NOTE — Progress Notes (Signed)
Impression and Recommendations:    Malodorous Urine  - Plan: POCT urinalysis dipstick w N exam - actually Patient does not have dysuria but just chills and malodorous urine. She's had these symptoms in the past with urinary tract infections, and worried she would get an infection again. - UA clear. Patient reassured. Supportive care discussed.  Prediabetes - Patient A1c was 5.9 on 7\17\17.  - Recheck 4 months from then around mid November   obese - still 194- no wt gain from last OV; encouraged wt loss   Patient's Medications  New Prescriptions   No medications on file  Previous Medications   CHOLECALCIFEROL (VITAMIN D3) 5000 UNITS CAPS    Take 5,000 Units by mouth daily.   GABAPENTIN (NEURONTIN) 600 MG TABLET    Take 300 mg by mouth daily as needed.   GLUCOS-CHOND-HYAL AC-CA FRUCTO (MOVE FREE JOINT HEALTH ADVANCE PO)    Take 1 tablet by mouth daily.   OVER THE COUNTER MEDICATION    Take 1 capsule by mouth daily. Alpha + CRS   OVER THE COUNTER MEDICATION    Take 1 capsule by mouth daily. Food Nutrient Complex   OVER THE COUNTER MEDICATION    Take 1 tablet by mouth daily. Essential Oil Omega Complex  Modified Medications   No medications on file  Discontinued Medications   No medications on file    No Follow-up on file.  The patient was counseled, risk factors were discussed, anticipatory guidance given.  Gross side effects, risk and benefits, and alternatives of medications discussed with patient.  Patient is aware that all medications have potential side effects and we are unable to predict every side effect or drug-drug interaction that may occur.  Expresses verbal understanding and consents to current therapy plan and treatment regimen.  Please see AVS handed out to patient at the end of our visit for further patient instructions/ counseling done pertaining to today's office visit.    Note: This document was prepared using Dragon voice recognition software and may  include unintentional dictation errors.   --------------------------------------------------------------------------------------------------------------------------------------------------------------------------------------------------------------------------------------------    Subjective:    CC:  Chief Complaint  Patient presents with  . Chills    history of UTI    HPI: Paula Conway is a 60 y.o. female who presents to [place] at [facility] today for issues as discussed below.   Onset: 2 days- Sx:  "feels chilly" and malodorous urine are pt's only Sx.   Worsening: no   Symptoms:  Urgency: no  Frequency: no  Hesitancy: no  Hematuria: no  Flank Pain: no  Fever: no    Nausea/Vomiting: no  Pregnant: no STD exposure/history: no Discharge: no Irritants: no Rash: no Red Flags  : no  Recent Antibiotic Usage (last 30 days): no  Symptoms lasting more than seven (7) days: no  More than 3 UTI's last 12 months: no   PMH of 1. DM: no 2. Renal Disease/Calculi: no 3. Urinary Tract Abnormality: no  4. Instrumentation/Trauma: no 5. Immunosuppression: no   Wt Readings from Last 3 Encounters:  09/21/15 194 lb 14.4 oz (88.4 kg)  09/01/15 194 lb (88 kg)  07/30/15 198 lb (89.8 kg)   BP Readings from Last 3 Encounters:  09/01/15 121/77  07/30/15 118/74  07/16/15 (!) 144/78   Pulse Readings from Last 3 Encounters:  09/21/15 78  09/01/15 61  07/30/15 68     Patient Active Problem List   Diagnosis Date Noted  .  Prediabetes 07/30/2015  . Elevated LDL cholesterol level 07/30/2015  . Elevated TSH 07/30/2015  . obese 07/20/2015  . Family history of breast cancer in Mother 07/20/2015  . GERD (gastroesophageal reflux disease) 07/20/2015  . Arthritis 07/20/2015  . Hyperlipidemia 07/20/2015  . Environmental and seasonal allergies 07/20/2015  . Spinal stenosis: Chronic back pain  07/20/2015  . Encounter for screening mammogram for breast cancer  07/16/2015    Past Medical history, Surgical history, Family history, Social history, Allergies and Medications have been entered into the medical record, reviewed and changed as needed.   Allergies:  No Known Allergies  Review of Systems: No fever/ chills, night sweats, no unintended weight loss, No chest pain, or increased shortness of breath. No N/V/D.  Pertinent positives and negatives noted in HPI above    Objective:   Pulse 78, temperature 97.5 F (36.4 C), temperature source Oral, height 5' 4.25" (1.632 m), weight 194 lb 14.4 oz (88.4 kg). Body mass index is 33.19 kg/m. General: Well Developed, well nourished, appropriate for stated age.  Neuro: Alert and oriented x3, extra-ocular muscles intact, sensation grossly intact.  HEENT: Normocephalic, atraumatic, neck supple   Skin: Warm and dry, no gross rash. Cardiac: RRR, S1 S2,  no murmurs rubs or gallops.  Respiratory: ECTA B/L, Not using accessory muscles, speaking in full sentences-unlabored.  No flank TTP. Abd: No G/R/R; + BS * 4, no OM Vascular:  No gross lower ext edema, cap RF less 2 sec. Psych: No HI/SI, judgement and insight good, Euthymic mood. Full Affect.

## 2016-01-27 ENCOUNTER — Encounter: Payer: Self-pay | Admitting: Family Medicine

## 2016-01-27 ENCOUNTER — Ambulatory Visit: Payer: BC Managed Care – PPO | Admitting: Family Medicine

## 2016-02-03 ENCOUNTER — Ambulatory Visit (INDEPENDENT_AMBULATORY_CARE_PROVIDER_SITE_OTHER): Payer: BC Managed Care – PPO | Admitting: Family Medicine

## 2016-02-03 ENCOUNTER — Encounter: Payer: Self-pay | Admitting: Family Medicine

## 2016-02-03 VITALS — BP 131/81 | HR 76 | Ht 64.25 in | Wt 194.1 lb

## 2016-02-03 DIAGNOSIS — R05 Cough: Secondary | ICD-10-CM

## 2016-02-03 DIAGNOSIS — J322 Chronic ethmoidal sinusitis: Secondary | ICD-10-CM

## 2016-02-03 DIAGNOSIS — H6982 Other specified disorders of Eustachian tube, left ear: Secondary | ICD-10-CM | POA: Diagnosis not present

## 2016-02-03 DIAGNOSIS — R059 Cough, unspecified: Secondary | ICD-10-CM

## 2016-02-03 MED ORDER — FLUTICASONE PROPIONATE 50 MCG/ACT NA SUSP: NASAL | 5 refills | Status: DC

## 2016-02-03 MED ORDER — HYDROCOD POLST-CPM POLST ER 10-8 MG/5ML PO SUER: ORAL | 0 refills | Status: DC

## 2016-02-03 MED ORDER — AMOXICILLIN 875 MG PO TABS: 875.0000 mg | ORAL_TABLET | Freq: Two times a day (BID) | ORAL | 0 refills | Status: DC

## 2016-02-03 NOTE — Patient Instructions (Addendum)
-   Mucinex prn - Flonase twice daily-one spray each nostril after you do a Ayr or Milta Deiters med sinus rinse - cough medicine at night -  Start ABX if you dev Fevers and one sided face or ear pain- then fill it

## 2016-02-03 NOTE — Progress Notes (Signed)
Acute Care Office visit  Assessment and plan:  1. Cough   2. ETD (Eustachian tube dysfunction), left   3. Ethmoid sinusitis, unspecified chronicity    Anticipatory guidance and routine counseling done re: condition, txmnt options and need for follow up. All questions of patient's were answered.  - Viral vs Allergic vs Bacterial causes for pt's symptoms reveiwed.    - Supportive care and various OTC medications discussed in addition to any prescribed. - Call or RTC if new symptoms, or if no improvement or worse over next couple days.   - Will consider ABX at that time if sx develop into fevers and one-sided face or ear pain, and worsening.    She will hold off and not get the amoxicillin unless she develops those symptoms.   New Prescriptions   AMOXICILLIN (AMOXIL) 875 MG TABLET    Take 1 tablet (875 mg total) by mouth 2 (two) times daily.   CHLORPHENIRAMINE-HYDROCODONE (TUSSIONEX) 10-8 MG/5ML SUER    5 ML's every 8-12 hours when necessary cough   FLUTICASONE (FLONASE) 50 MCG/ACT NASAL SPRAY    1 spray each nostril twice daily    Gross side effects, risk and benefits, and alternatives of medications discussed with patient.  Patient is aware that all medications have potential side effects and we are unable to predict every sideeffect or drug-drug interaction that may occur.  Expresses verbal understanding and consents to current therapy plan and treatment regiment.  Return if symptoms worsen or fail to improve, for f/up chronic medical issues very near future- told to make appt  for this- missed ones in past.   --->  Patient was last seen on 8\22\17 for chronic care and f/up.  Is suppose to come in for Fasting blood work- for lipid panel,  A1c, tsh and to discuss chol, wt loss in very near future.  Please see AVS handed out to patient at the end of our visit for additional patient instructions/ counseling done pertaining to today's office visit.  Note: This document was prepared  using Dragon voice recognition software and may include unintentional dictation errors.    Subjective:    Chief Complaint  Patient presents with  . Cough    HPI:  Pt presents with URI sx for days.      Babysitting the grandkids- have runny nose, PND and cough.   Husband sick with sinus infection last week,    - cough for two weeks- mostly dry, but once q am spits up phelgm- past 3 days--> small amnout.   C/o rhinorrhea, HA, and cough.     Denies objective F/C, No face pain or ear pain, No N/V/D, No SOB/DIB, No Rash.  Tried- nothing for it.      --> Started diet challenge on this past Monday -  healthy diet---> clensing herself of carbs.   Directed by her yoga instructor.  No dairy, no soy, no potatoes etc.  Mostly veggies.  But had a horrible HA ever since- as she weens off carbs.     Patient Active Problem List   Diagnosis Date Noted  . Prediabetes 07/30/2015  . Elevated LDL cholesterol level 07/30/2015  . Elevated TSH 07/30/2015  . obese 07/20/2015  . Family history of breast cancer in Mother 07/20/2015  . GERD (gastroesophageal reflux disease) 07/20/2015  . Arthritis 07/20/2015  . Hyperlipidemia 07/20/2015  . Environmental and seasonal allergies 07/20/2015  . Spinal stenosis: Chronic back pain  07/20/2015  . Encounter for screening mammogram for  breast cancer 07/16/2015    Past medical history, Surgical history, Family history reviewed and noted below, Social history, Allergies, and Medications have been entered into the medical record, reviewed and changed as needed.   No Known Allergies  Review of Systems: General:   No F/C, wt loss Pulm:   No DIB, pleuritic chest pain Card:  No CP, palpitations Abd:  No n/v/d or pain Ext:  No inc edema from baseline   Objective:   Blood pressure 131/81, pulse 76, height 5' 4.25" (1.632 m), weight 194 lb 1.6 oz (88 kg). Body mass index is 33.06 kg/m. General: Well Developed, well nourished, appropriate for stated age.    Neuro: Alert and oriented x3, extra-ocular muscles intact, sensation grossly intact.  HEENT: Normocephalic, atraumatic, pupils equal round reactive to light, neck supple, no masses, no painful lymphadenopathy, TM's intact B/L, no acute findings. Nares- patent, clear d/c, OP- clear, mild erythema, No TTP sinuses Skin: Warm and dry, no gross rash. Cardiac: RRR, S1 S2,  no murmurs rubs or gallops.  Respiratory: ECTA B/L and A/P, Not using accessory muscles, speaking in full sentences- unlabored. Vascular:  No gross lower ext edema, cap RF less 2 sec. Psych: No HI/SI, judgement and insight good, Euthymic mood. Full Affect.   Patient Care Team    Relationship Specialty Notifications Start End  Mellody Dance, DO PCP - General Family Medicine  07/16/15

## 2016-08-30 ENCOUNTER — Other Ambulatory Visit: Payer: Self-pay | Admitting: Family Medicine

## 2016-08-30 ENCOUNTER — Ambulatory Visit (INDEPENDENT_AMBULATORY_CARE_PROVIDER_SITE_OTHER): Payer: BC Managed Care – PPO | Admitting: Family Medicine

## 2016-08-30 ENCOUNTER — Other Ambulatory Visit (HOSPITAL_COMMUNITY)
Admission: RE | Admit: 2016-08-30 | Discharge: 2016-08-30 | Disposition: A | Discharge status: Home / Self Care | Payer: BC Managed Care – PPO | Source: Ambulatory Visit | Attending: Family Medicine | Admitting: Family Medicine

## 2016-08-30 ENCOUNTER — Encounter: Payer: Self-pay | Admitting: Family Medicine

## 2016-08-30 VITALS — BP 138/77 | HR 69 | Ht 64.25 in | Wt 188.7 lb

## 2016-08-30 DIAGNOSIS — E785 Hyperlipidemia, unspecified: Secondary | ICD-10-CM

## 2016-08-30 DIAGNOSIS — Z114 Encounter for screening for human immunodeficiency virus [HIV]: Secondary | ICD-10-CM | POA: Diagnosis not present

## 2016-08-30 DIAGNOSIS — E669 Obesity, unspecified: Secondary | ICD-10-CM | POA: Diagnosis not present

## 2016-08-30 DIAGNOSIS — R7989 Other specified abnormal findings of blood chemistry: Secondary | ICD-10-CM

## 2016-08-30 DIAGNOSIS — Z1211 Encounter for screening for malignant neoplasm of colon: Secondary | ICD-10-CM | POA: Diagnosis not present

## 2016-08-30 DIAGNOSIS — Z719 Counseling, unspecified: Secondary | ICD-10-CM | POA: Insufficient documentation

## 2016-08-30 DIAGNOSIS — Z1231 Encounter for screening mammogram for malignant neoplasm of breast: Secondary | ICD-10-CM

## 2016-08-30 DIAGNOSIS — Z124 Encounter for screening for malignant neoplasm of cervix: Secondary | ICD-10-CM | POA: Insufficient documentation

## 2016-08-30 DIAGNOSIS — Z0000 Encounter for general adult medical examination without abnormal findings: Secondary | ICD-10-CM

## 2016-08-30 DIAGNOSIS — Z8639 Personal history of other endocrine, nutritional and metabolic disease: Secondary | ICD-10-CM

## 2016-08-30 DIAGNOSIS — R946 Abnormal results of thyroid function studies: Secondary | ICD-10-CM

## 2016-08-30 DIAGNOSIS — R7303 Prediabetes: Secondary | ICD-10-CM

## 2016-08-30 DIAGNOSIS — Z1389 Encounter for screening for other disorder: Secondary | ICD-10-CM | POA: Diagnosis not present

## 2016-08-30 DIAGNOSIS — Z1159 Encounter for screening for other viral diseases: Secondary | ICD-10-CM | POA: Diagnosis not present

## 2016-08-30 DIAGNOSIS — E7800 Pure hypercholesterolemia, unspecified: Secondary | ICD-10-CM | POA: Diagnosis not present

## 2016-08-30 LAB — POC HEMOCCULT BLD/STL (OFFICE/1-CARD/DIAGNOSTIC): Fecal Occult Blood, POC: NEGATIVE

## 2016-08-30 NOTE — Progress Notes (Signed)
Impression and Recommendations:    1. Encounter for wellness examination   2. Health education/counseling   3. Screening for multiple conditions   4. Papanicolaou smear for cervical cancer screening   5. Screening for colon cancer   6. Prediabetes   7. Obesity, Class I, BMI 30-34.9   8. Elevated LDL cholesterol level   9. Elevated TSH   10. Hyperlipidemia, unspecified hyperlipidemia type   11. Need for hepatitis C screening test   12. Screening for HIV without presence of risk factors   13. History of vitamin D deficiency     Please see orders section below and AVS for further details of actions taken during this office visit.  1) Anticipatory Guidance: Discussed importance of wearing a seatbelt while driving, not texting while driving; sunscreen when outside along with yearly skin surveillance; eating a well balanced and modest diet; physical activity at least 25 minutes per day or 150 min/ week of moderate to intense activity.  2) Immunizations / Screenings / Labs:  All immunizations and screenings that patient agrees to, are up-to-date per recommendations or will be updated today.  Patient understands the needs for q 2mo dental and yearly vision screens which pt will schedule independently. Obtain bldwrk when fasting if not already done recently.   3) Weight:   Discussed goal of losing even 5-10% of current body weight which would improve overall feelings of well being and improve objective health data significantly.   Improve nutrient density of diet through increasing intake of fruits and vegetables and decreasing saturated/trans fats, white flour products and refined sugar products.   F-up preventative CPE in 1 year. F/up sooner for chronic care management as discussed and/or prn.    Orders Placed This Encounter  Procedures  . CBC with Differential/Platelet  . Comprehensive metabolic panel  . Hemoglobin A1c  . HIV antibody  . Hepatitis C antibody  . Lipid panel  .  TSH  . VITAMIN D 25 Hydroxy (Vit-D Deficiency, Fractures)  . POC Hemoccult Bld/Stl (1-Cd Office Dx)    Please see orders placed and AVS handed out to patient at the end of our visit for further patient instructions/ counseling done pertaining to today's office visit.     Subjective:    Chief Complaint  Patient presents with  . Annual Exam   CC:  CPE  HPI: Paula Conway is a 61 y.o. female who presents to [place] at [facility] today a yearly health maintenance exam.  Health Maintenance Summary Reviewed and updated, unless pt declines services.  Aspirin: administering 81 mg daily  Colonoscopy:     Was done at [facility] on 6\10\10.  Per patient it was normal.  Was told to repeat in 10 years but patient declines to repeat it ever again.    Tdap: Up to date 2013  Tobacco History Reviewed:   Y   Alcohol:    No concerns, no excessive use  Exercise Habits:   Not meeting AHA guidelines  STD concerns:   None  Drug Use:   None  Birth control method:   n/a Menses regular:     N/a; but last  PAP smear was 5 yrs ago.  N with negative HPV per pt  Lumps or breast concerns: No;  Mammo-   last done 2\87\86.  Was normal.  Patient will call for a repeat.  Breast Cancer Family History:      yes  Bone/ DEXA scan:   N/a  Health Maintenance  Topic Date Due  . PAP SMEAR  01/17/1976  . INFLUENZA VACCINE  08/10/2016  . COLONOSCOPY  08/30/2028 (Originally 01/16/2005)  . Hepatitis C Screening  08/30/2028 (Originally 1955-04-18)  . HIV Screening  08/30/2028 (Originally 01/16/1970)  . MAMMOGRAM  09/07/2017  . TETANUS/TDAP  10/27/2021     Wt Readings from Last 3 Encounters:  08/30/16 188 lb 11.2 oz (85.6 kg)  02/03/16 194 lb 1.6 oz (88 kg)  09/21/15 194 lb 14.4 oz (88.4 kg)   BP Readings from Last 3 Encounters:  08/30/16 138/77  02/03/16 131/81  09/21/15 115/80   Pulse Readings from Last 3 Encounters:  08/30/16 69  02/03/16 76  09/21/15 78     Past Medical  History:  Diagnosis Date  . Allergy   . Arthritis   . Environmental and seasonal allergies 07/20/2015  . Family history of breast cancer in Mother 07/20/2015   Mother was diagnosed at 52 years old and died age 15 of breast cancer   . GERD (gastroesophageal reflux disease)   . Hyperlipidemia   . Obesity 07/20/2015  . Spinal stenosis       Past Surgical History:  Procedure Laterality Date  . APPENDECTOMY    . core biopsy        Family History  Problem Relation Age of Onset  . Cancer Mother        breast  . Diabetes Father   . Hypertension Father   . Heart disease Father       History  Drug Use No  ,   History  Alcohol Use  . Yes    Comment: socially  ,   History  Smoking Status  . Former Smoker  . Quit date: 01/11/1975  Smokeless Tobacco  . Never Used  ,   History  Sexual Activity  . Sexual activity: Yes    Current Outpatient Prescriptions on File Prior to Visit  Medication Sig Dispense Refill  . Cholecalciferol (VITAMIN D3) 5000 units CAPS Take 5,000 Units by mouth daily.    . Glucos-Chond-Hyal Ac-Ca Fructo (MOVE FREE JOINT HEALTH ADVANCE PO) Take 1 tablet by mouth daily.    . Magnesium 250 MG TABS Take 1 tablet by mouth daily.    . Probiotic Product (PROBIOTIC PO) Take 1 tablet by mouth daily.     No current facility-administered medications on file prior to visit.     Allergies: Patient has no known allergies.  Review of Systems: General:   Denies fever, chills, unexplained weight loss.  Optho/Auditory:   Denies visual changes, blurred vision/LOV Respiratory:   Denies SOB, DOE more than baseline levels.  Cardiovascular:   Denies chest pain, palpitations, new onset peripheral edema  Gastrointestinal:   Denies nausea, vomiting, diarrhea.  Genitourinary: Denies dysuria, freq/ urgency, flank pain or discharge from genitals.  Endocrine:     Denies hot or cold intolerance, polyuria, polydipsia. Musculoskeletal:   Denies unexplained myalgias, joint  swelling, unexplained arthralgias, gait problems.  Skin:  Denies rash, suspicious lesions Neurological:     Denies dizziness, unexplained weakness, numbness  Psychiatric/Behavioral:   Denies mood changes, suicidal or homicidal ideations, hallucinations    Objective:    Blood pressure 138/77, pulse 69, height 5' 4.25" (1.632 m), weight 188 lb 11.2 oz (85.6 kg). Body mass index is 32.14 kg/m. General Appearance:    Alert, cooperative, no distress, appears stated age  Head:    Normocephalic, without obvious abnormality, atraumatic  Eyes:    PERRL, conjunctiva/corneas clear, EOM's  intact, fundi    benign, both eyes  Ears:    Normal TM's and external ear canals, both ears  Nose:   Nares normal, septum midline, mucosa normal, no drainage    or sinus tenderness  Throat:   Lips w/o lesion, mucosa moist, and tongue normal; teeth and   gums normal  Neck:   Supple, symmetrical, trachea midline, no adenopathy;    thyroid:  no enlargement/tenderness/nodules; no carotid   bruit or JVD  Back:     Symmetric, no curvature, ROM normal, no CVA tenderness  Lungs:     Clear to auscultation bilaterally, respirations unlabored, no       Wh/ R/ R  Chest Wall:    No tenderness or gross deformity; normal excursion   Heart:    Regular rate and rhythm, S1 and S2 normal, no murmur, rub   or gallop  Breast Exam:    No tenderness, masses, or nipple abnormality b/l; no d/c  Abdomen:     Soft, non-tender, bowel sounds active all four quadrants, NO   G/R/R, no masses, no organomegaly  Genitalia:    Ext genitalia: without lesion, no rash or discharge, No         tenderness;  Cervix: WNL's w/o discharge or lesion;        Adnexa:  No tenderness or palpable masses   Rectal:    Normal to relaxed tone, no masses or tenderness; + ext hem present   guaiac negative stool  Extremities:   Extremities normal, atraumatic, no cyanosis or gross edema  Pulses:   2+ and symmetric all extremities  Skin:   Warm, dry, Skin color,  texture, turgor normal, no obvious rashes or lesions Psych: No HI/SI, judgement and insight good, Euthymic mood. Full Affect.  Neurologic:   CNII-XII intact, normal strength, sensation and reflexes    Throughout

## 2016-08-30 NOTE — Patient Instructions (Addendum)
Please call the [facility] and get your mammogram done as you are due end of this month.  Please make sure you follow up in the near future for fasting blood work.  Preventive Care for Adults, Female  A healthy lifestyle and preventive care can promote health and wellness. Preventive health guidelines for women include the following key practices.   A routine yearly physical is a good way to check with your health care provider about your health and preventive screening. It is a chance to share any concerns and updates on your health and to receive a thorough exam.   Visit your dentist for a routine exam and preventive care every 6 months. Brush your teeth twice a day and floss once a day. Good oral hygiene prevents tooth decay and gum disease.   The frequency of eye exams is based on your age, health, family medical history, use of contact lenses, and other factors. Follow your health care provider's recommendations for frequency of eye exams.   Eat a healthy diet. Foods like vegetables, fruits, whole grains, low-fat dairy products, and lean protein foods contain the nutrients you need without too many calories. Decrease your intake of foods high in solid fats, added sugars, and salt. Eat the right amount of calories for you.Get information about a proper diet from your health care provider, if necessary.   Regular physical exercise is one of the most important things you can do for your health. Most adults should get at least 150 minutes of moderate-intensity exercise (any activity that increases your heart rate and causes you to sweat) each week. In addition, most adults need muscle-strengthening exercises on 2 or more days a week.   Maintain a healthy weight. The body mass index (BMI) is a screening tool to identify possible weight problems. It provides an estimate of body fat based on height and weight. Your health care provider can find your BMI, and can help you achieve  or maintain a healthy weight.For adults 20 years and older:   - A BMI below 18.5 is considered underweight.   - A BMI of 18.5 to 24.9 is normal.   - A BMI of 25 to 29.9 is considered overweight.   - A BMI of 30 and above is considered obese.   Maintain normal blood lipids and cholesterol levels by exercising and minimizing your intake of trans and saturated fats.  Eat a balanced diet with plenty of fruit and vegetables. Blood tests for lipids and cholesterol should begin at age 35 and be repeated every 5 years minimum.  If your lipid or cholesterol levels are high, you are over 40, or you are at high risk for heart disease, you may need your cholesterol levels checked more frequently.Ongoing high lipid and cholesterol levels should be treated with medicines if diet and exercise are not working.   If you smoke, find out from your health care provider how to quit. If you do not use tobacco, do not start.   Lung cancer screening is recommended for adults aged 82-80 years who are at high risk for developing lung cancer because of a history of smoking. A yearly low-dose CT scan of the lungs is recommended for people who have at least a 30-pack-year history of smoking and are a current smoker or have quit within the past 15 years. A pack year of smoking is smoking an average of 1 pack of cigarettes a day for 1 year (for example: 1 pack a day for  30 years or 2 packs a day for 15 years). Yearly screening should continue until the smoker has stopped smoking for at least 15 years. Yearly screening should be stopped for people who develop a health problem that would prevent them from having lung cancer treatment.   If you are pregnant, do not drink alcohol. If you are breastfeeding, be very cautious about drinking alcohol. If you are not pregnant and choose to drink alcohol, do not have more than 1 drink per day. One drink is considered to be 12 ounces (355 mL) of beer, 5 ounces (148 mL) of wine, or 1.5  ounces (44 mL) of liquor.   Avoid use of street drugs. Do not share needles with anyone. Ask for help if you need support or instructions about stopping the use of drugs.   High blood pressure causes heart disease and increases the risk of stroke. Your blood pressure should be checked at least yearly.  Ongoing high blood pressure should be treated with medicines if weight loss and exercise do not work.   If you are 62-29 years old, ask your health care provider if you should take aspirin to prevent strokes.   Diabetes screening involves taking a blood sample to check your fasting blood sugar level. This should be done once every 3 years, after age 12, if you are within normal weight and without risk factors for diabetes. Testing should be considered at a younger age or be carried out more frequently if you are overweight and have at least 1 risk factor for diabetes.   Breast cancer screening is essential preventive care for women. You should practice "breast self-awareness."  This means understanding the normal appearance and feel of your breasts and may include breast self-examination.  Any changes detected, no matter how small, should be reported to a health care provider.  Women in their 56s and 30s should have a clinical breast exam (CBE) by a health care provider as part of a regular health exam every 1 to 3 years.  After age 32, women should have a CBE every year.  Starting at age 10, women should consider having a mammogram (breast X-ray test) every year.  Women who have a family history of breast cancer should talk to their health care provider about genetic screening.  Women at a high risk of breast cancer should talk to their health care providers about having an MRI and a mammogram every year.   -Breast cancer gene (BRCA)-related cancer risk assessment is recommended for women who have family members with BRCA-related cancers. BRCA-related cancers include breast, ovarian, tubal, and  peritoneal cancers. Having family members with these cancers may be associated with an increased risk for harmful changes (mutations) in the breast cancer genes BRCA1 and BRCA2. Results of the assessment will determine the need for genetic counseling and BRCA1 and BRCA2 testing.   The Pap test is a screening test for cervical cancer. A Pap test can show cell changes on the cervix that might become cervical cancer if left untreated. A Pap test is a procedure in which cells are obtained and examined from the lower end of the uterus (cervix).   - Women should have a Pap test starting at age 69.   - Between ages 100 and 39, Pap tests should be repeated every 2 years.   - Beginning at age 4, you should have a Pap test every 3 years as long as the past 3 Pap tests have been normal.   -  Some women have medical problems that increase the chance of getting cervical cancer. Talk to your health care provider about these problems. It is especially important to talk to your health care provider if a new problem develops soon after your last Pap test. In these cases, your health care provider may recommend more frequent screening and Pap tests.   - The above recommendations are the same for women who have or have not gotten the vaccine for human papillomavirus (HPV).   - If you had a hysterectomy for a problem that was not cancer or a condition that could lead to cancer, then you no longer need Pap tests. Even if you no longer need a Pap test, a regular exam is a good idea to make sure no other problems are starting.   - If you are between ages 45 and 82 years, and you have had normal Pap tests going back 10 years, you no longer need Pap tests. Even if you no longer need a Pap test, a regular exam is a good idea to make sure no other problems are starting.   - If you have had past treatment for cervical cancer or a condition that could lead to cancer, you need Pap tests and screening for cancer for at least 20  years after your treatment.   - If Pap tests have been discontinued, risk factors (such as a new sexual partner) need to be reassessed to determine if screening should be resumed.   - The HPV test is an additional test that may be used for cervical cancer screening. The HPV test looks for the virus that can cause the cell changes on the cervix. The cells collected during the Pap test can be tested for HPV. The HPV test could be used to screen women aged 66 years and older, and should be used in women of any age who have unclear Pap test results. After the age of 21, women should have HPV testing at the same frequency as a Pap test.   Colorectal cancer can be detected and often prevented. Most routine colorectal cancer screening begins at the age of 43 years and continues through age 63 years. However, your health care provider may recommend screening at an earlier age if you have risk factors for colon cancer. On a yearly basis, your health care provider may provide home test kits to check for hidden blood in the stool.  Use of a small camera at the end of a tube, to directly examine the colon (sigmoidoscopy or colonoscopy), can detect the earliest forms of colorectal cancer. Talk to your health care provider about this at age 72, when routine screening begins. Direct exam of the colon should be repeated every 5 -10 years through age 59 years, unless early forms of pre-cancerous polyps or small growths are found.   People who are at an increased risk for hepatitis B should be screened for this virus. You are considered at high risk for hepatitis B if:  -You were born in a country where hepatitis B occurs often. Talk with your health care provider about which countries are considered high risk.  - Your parents were born in a high-risk country and you have not received a shot to protect against hepatitis B (hepatitis B vaccine).  - You have HIV or AIDS.  - You use needles to inject street drugs.  - You  live with, or have sex with, someone who has Hepatitis B.  - You get hemodialysis  treatment.  - You take certain medicines for conditions like cancer, organ transplantation, and autoimmune conditions.   Hepatitis C blood testing is recommended for all people born from [birth year] through 1965 and any individual with known risks for hepatitis C.   Practice safe sex. Use condoms and avoid high-risk sexual practices to reduce the spread of sexually transmitted infections (STIs). STIs include gonorrhea, chlamydia, syphilis, trichomonas, herpes, HPV, and human immunodeficiency virus (HIV). Herpes, HIV, and HPV are viral illnesses that have no cure. They can result in disability, cancer, and death. Sexually active women aged 46 years and younger should be checked for chlamydia. Older women with new or multiple partners should also be tested for chlamydia. Testing for other STIs is recommended if you are sexually active and at increased risk.   Osteoporosis is a disease in which the bones lose minerals and strength with aging. This can result in serious bone fractures or breaks. The risk of osteoporosis can be identified using a bone density scan. Women ages 63 years and over and women at risk for fractures or osteoporosis should discuss screening with their health care providers. Ask your health care provider whether you should take a calcium supplement or vitamin D to There are also several preventive steps women can take to avoid osteoporosis and resulting fractures or to keep osteoporosis from worsening. -->Recommendations include:  Eat a balanced diet high in fruits, vegetables, calcium, and vitamins.  Get enough calcium. The recommended total intake of is 1,200 mg daily; for best absorption, if taking supplements, divide doses into 250-500 mg doses throughout the day. Of the two types of calcium, calcium carbonate is best absorbed when taken with food but calcium citrate can be taken on an empty  stomach.  Get enough vitamin D. N[employer] and the [employer] recommend at least 1,000 IU per day for women age 60 and over who are at risk of vitamin D deficiency. Vitamin D deficiency can be caused by inadequate sun exposure (for example, those who live in [place]).  Avoid alcohol and smoking. Heavy alcohol intake (more than 7 drinks per week) increases the risk of falls and hip fracture and women smokers tend to lose bone more rapidly and have lower bone mass than nonsmokers. Stopping smoking is one of the most important changes women can make to improve their health and decrease risk for disease.  Be physically active every day. Weight-bearing exercise (for example, fast walking, hiking, jogging, and weight training) may strengthen bones or slow the rate of bone loss that comes with aging. Balancing and muscle-strengthening exercises can reduce the risk of falling and fracture.  Consider therapeutic medications. Currently, several types of effective drugs are available. Healthcare providers can recommend the type most appropriate for each woman.  Eliminate environmental factors that may contribute to accidents. Falls cause nearly 90% of all osteoporotic fractures, so reducing this risk is an important bone-health strategy. Measures include ample lighting, removing obstructions to walking, using nonskid rugs on floors, and placing mats and/or grab bars in showers.  Be aware of medication side effects. Some common medicines make bones weaker. These include a type of steroid drug called glucocorticoids used for arthritis and asthma, some antiseizure drugs, certain sleeping pills, treatments for endometriosis, and some cancer drugs. An overactive thyroid gland or using too much thyroid hormone for an underactive thyroid can also be a problem. If you are taking these medicines, talk to your doctor about what you can do to help protect your bones.reduce  the rate of  osteoporosis.    Menopause can be associated with physical symptoms and risks. Hormone replacement therapy is available to decrease symptoms and risks. You should talk to your health care provider about whether hormone replacement therapy is right for you.   Use sunscreen. Apply sunscreen liberally and repeatedly throughout the day. You should seek shade when your shadow is shorter than you. Protect yourself by wearing long sleeves, pants, a wide-brimmed hat, and sunglasses year round, whenever you are outdoors.   Once a month, do a whole body skin exam, using a mirror to look at the skin on your back. Tell your health care provider of new moles, moles that have irregular borders, moles that are larger than a pencil eraser, or moles that have changed in shape or color.   -Stay current with required vaccines (immunizations).   Influenza vaccine. All adults should be immunized every year.  Tetanus, diphtheria, and acellular pertussis (Td, Tdap) vaccine. Pregnant women should receive 1 dose of Tdap vaccine during each pregnancy. The dose should be obtained regardless of the length of time since the last dose. Immunization is preferred during the 27th 36th week of gestation. An adult who has not previously received Tdap or who does not know her vaccine status should receive 1 dose of Tdap. This initial dose should be followed by tetanus and diphtheria toxoids (Td) booster doses every 10 years. Adults with an unknown or incomplete history of completing a 3-dose immunization series with Td-containing vaccines should begin or complete a primary immunization series including a Tdap dose. Adults should receive a Td booster every 10 years.  Varicella vaccine. An adult without evidence of immunity to varicella should receive 2 doses or a second dose if she has previously received 1 dose. Pregnant females who do not have evidence of immunity should receive the first dose after pregnancy. This first dose  should be obtained before leaving the health care facility. The second dose should be obtained 4 8 weeks after the first dose.  Human papillomavirus (HPV) vaccine. Females aged 65 26 years who have not received the vaccine previously should obtain the 3-dose series. The vaccine is not recommended for use in pregnant females. However, pregnancy testing is not needed before receiving a dose. If a female is found to be pregnant after receiving a dose, no treatment is needed. In that case, the remaining doses should be delayed until after the pregnancy. Immunization is recommended for any person with an immunocompromised condition through the age of 58 years if she did not get any or all doses earlier. During the 3-dose series, the second dose should be obtained 4 8 weeks after the first dose. The third dose should be obtained 24 weeks after the first dose and 16 weeks after the second dose.  Zoster vaccine. One dose is recommended for adults aged 68 years or older unless certain conditions are present.  Measles, mumps, and rubella (MMR) vaccine. Adults born before 29 generally are considered immune to measles and mumps. Adults born in [birth year] or later should have 1 or more doses of MMR vaccine unless there is a contraindication to the vaccine or there is laboratory evidence of immunity to each of the three diseases. A routine second dose of MMR vaccine should be obtained at least 28 days after the first dose for students attending postsecondary schools, health care workers, or international travelers. People who received inactivated measles vaccine or an unknown type of measles vaccine during 1963 1967 should receive  2 doses of MMR vaccine. People who received inactivated mumps vaccine or an unknown type of mumps vaccine before 1979 and are at high risk for mumps infection should consider immunization with 2 doses of MMR vaccine. For females of childbearing age, rubella immunity should be determined. If there is  no evidence of immunity, females who are not pregnant should be vaccinated. If there is no evidence of immunity, females who are pregnant should delay immunization until after pregnancy. Unvaccinated health care workers born before 30 who lack laboratory evidence of measles, mumps, or rubella immunity or laboratory confirmation of disease should consider measles and mumps immunization with 2 doses of MMR vaccine or rubella immunization with 1 dose of MMR vaccine.  Pneumococcal 13-valent conjugate (PCV13) vaccine. When indicated, a person who is uncertain of her immunization history and has no record of immunization should receive the PCV13 vaccine. An adult aged 60 years or older who has certain medical conditions and has not been previously immunized should receive 1 dose of PCV13 vaccine. This PCV13 should be followed with a dose of pneumococcal polysaccharide (PPSV23) vaccine. The PPSV23 vaccine dose should be obtained at least 8 weeks after the dose of PCV13 vaccine. An adult aged 71 years or older who has certain medical conditions and previously received 1 or more doses of PPSV23 vaccine should receive 1 dose of PCV13. The PCV13 vaccine dose should be obtained 1 or more years after the last PPSV23 vaccine dose.  Pneumococcal polysaccharide (PPSV23) vaccine. When PCV13 is also indicated, PCV13 should be obtained first. All adults aged 12 years and older should be immunized. An adult younger than age 28 years who has certain medical conditions should be immunized. Any person who resides in a nursing home or long-term care facility should be immunized. An adult smoker should be immunized. People with an immunocompromised condition and certain other conditions should receive both PCV13 and PPSV23 vaccines. People with human immunodeficiency virus (HIV) infection should be immunized as soon as possible after diagnosis. Immunization during chemotherapy or radiation therapy should be avoided. Routine use of  PPSV23 vaccine is not recommended for American Indians, [place] Natives, or people younger than 65 years unless there are medical conditions that require PPSV23 vaccine. When indicated, people who have unknown immunization and have no record of immunization should receive PPSV23 vaccine. One-time revaccination 5 years after the first dose of PPSV23 is recommended for people aged 105 64 years who have chronic kidney failure, nephrotic syndrome, asplenia, or immunocompromised conditions. People who received 1 2 doses of PPSV23 before age 56 years should receive another dose of PPSV23 vaccine at age 70 years or later if at least 5 years have passed since the previous dose. Doses of PPSV23 are not needed for people immunized with PPSV23 at or after age 33 years.  Meningococcal vaccine. Adults with asplenia or persistent complement component deficiencies should receive 2 doses of quadrivalent meningococcal conjugate (MenACWY-D) vaccine. The doses should be obtained at least 2 months apart. Microbiologists working with certain meningococcal bacteria, [employer] recruits, people at risk during an outbreak, and people who travel to or live in countries with a high rate of meningitis should be immunized. A first-year college student up through age 76 years who is living in a residence hall should receive a dose if she did not receive a dose on or after her 16th birthday. Adults who have certain high-risk conditions should receive one or more doses of vaccine.  Hepatitis A vaccine. Adults who wish to be  protected from this disease, have certain high-risk conditions, work with hepatitis A-infected animals, work in hepatitis A research labs, or travel to or work in countries with a high rate of hepatitis A should be immunized. Adults who were previously unvaccinated and who anticipate close contact with an international adoptee during the first 60 days after arrival in the [place] from a country with a high rate of  hepatitis A should be immunized.  Hepatitis B vaccine.  Adults who wish to be protected from this disease, have certain high-risk conditions, may be exposed to blood or other infectious body fluids, are household contacts or sex partners of hepatitis B positive people, are clients or workers in certain care facilities, or travel to or work in countries with a high rate of hepatitis B should be immunized.  Haemophilus influenzae type b (Hib) vaccine. A previously unvaccinated person with asplenia or sickle cell disease or having a scheduled splenectomy should receive 1 dose of Hib vaccine. Regardless of previous immunization, a recipient of a hematopoietic stem cell transplant should receive a 3-dose series 6 12 months after her successful transplant. Hib vaccine is not recommended for adults with HIV infection.  Preventive Services / Frequency Ages 34 to 39years  Blood pressure check.** / Every 1 to 2 years.  Lipid and cholesterol check.** / Every 5 years beginning at age 17.  Clinical breast exam.** / Every 3 years for women in their 15s and 99s.  BRCA-related cancer risk assessment.** / For women who have family members with a BRCA-related cancer (breast, ovarian, tubal, or peritoneal cancers).  Pap test.** / Every 2 years from ages 27 through 50. Every 3 years starting at age 48 through age 40 or 75 with a history of 3 consecutive normal Pap tests.  HPV screening.** / Every 3 years from ages 72 through ages 77 to 67 with a history of 3 consecutive normal Pap tests.  Hepatitis C blood test.** / For any individual with known risks for hepatitis C.  Skin self-exam. / Monthly.  Influenza vaccine. / Every year.  Tetanus, diphtheria, and acellular pertussis (Tdap, Td) vaccine.** / Consult your health care provider. Pregnant women should receive 1 dose of Tdap vaccine during each pregnancy. 1 dose of Td every 10 years.  Varicella vaccine.** / Consult your health care provider. Pregnant  females who do not have evidence of immunity should receive the first dose after pregnancy.  HPV vaccine. / 3 doses over 6 months, if 47 and younger. The vaccine is not recommended for use in pregnant females. However, pregnancy testing is not needed before receiving a dose.  Measles, mumps, rubella (MMR) vaccine.** / You need at least 1 dose of MMR if you were born in 1957 or later. You may also need a 2nd dose. For females of childbearing age, rubella immunity should be determined. If there is no evidence of immunity, females who are not pregnant should be vaccinated. If there is no evidence of immunity, females who are pregnant should delay immunization until after pregnancy.  Pneumococcal 13-valent conjugate (PCV13) vaccine.** / Consult your health care provider.  Pneumococcal polysaccharide (PPSV23) vaccine.** / 1 to 2 doses if you smoke cigarettes or if you have certain conditions.  Meningococcal vaccine.** / 1 dose if you are age 70 to 55 years and a Market researcher living in a residence hall, or have one of several medical conditions, you need to get vaccinated against meningococcal disease. You may also need additional booster doses.  Hepatitis A  vaccine.** / Consult your health care provider.  Hepatitis B vaccine.** / Consult your health care provider.  Haemophilus influenzae type b (Hib) vaccine.** / Consult your health care provider.  Ages 76 to 64years  Blood pressure check.** / Every 1 to 2 years.  Lipid and cholesterol check.** / Every 5 years beginning at age 28 years.  Lung cancer screening. / Every year if you are aged 75 80 years and have a 30-pack-year history of smoking and currently smoke or have quit within the past 15 years. Yearly screening is stopped once you have quit smoking for at least 15 years or develop a health problem that would prevent you from having lung cancer treatment.  Clinical breast exam.** / Every year after age 74  years.  BRCA-related cancer risk assessment.** / For women who have family members with a BRCA-related cancer (breast, ovarian, tubal, or peritoneal cancers).  Mammogram.** / Every year beginning at age 77 years and continuing for as long as you are in good health. Consult with your health care provider.  Pap test.** / Every 3 years starting at age 20 years through age 80 or 11 years with a history of 3 consecutive normal Pap tests.  HPV screening.** / Every 3 years from ages 66 years through ages 39 to 74 years with a history of 3 consecutive normal Pap tests.  Fecal occult blood test (FOBT) of stool. / Every year beginning at age 77 years and continuing until age 35 years. You may not need to do this test if you get a colonoscopy every 10 years.  Flexible sigmoidoscopy or colonoscopy.** / Every 5 years for a flexible sigmoidoscopy or every 10 years for a colonoscopy beginning at age 64 years and continuing until age 30 years.  Hepatitis C blood test.** / For all people born from [birth year] through 1965 and any individual with known risks for hepatitis C.  Skin self-exam. / Monthly.  Influenza vaccine. / Every year.  Tetanus, diphtheria, and acellular pertussis (Tdap/Td) vaccine.** / Consult your health care provider. Pregnant women should receive 1 dose of Tdap vaccine during each pregnancy. 1 dose of Td every 10 years.  Varicella vaccine.** / Consult your health care provider. Pregnant females who do not have evidence of immunity should receive the first dose after pregnancy.  Zoster vaccine.** / 1 dose for adults aged 63 years or older.  Measles, mumps, rubella (MMR) vaccine.** / You need at least 1 dose of MMR if you were born in 1957 or later. You may also need a 2nd dose. For females of childbearing age, rubella immunity should be determined. If there is no evidence of immunity, females who are not pregnant should be vaccinated. If there is no evidence of immunity, females who are  pregnant should delay immunization until after pregnancy.  Pneumococcal 13-valent conjugate (PCV13) vaccine.** / Consult your health care provider.  Pneumococcal polysaccharide (PPSV23) vaccine.** / 1 to 2 doses if you smoke cigarettes or if you have certain conditions.  Meningococcal vaccine.** / Consult your health care provider.  Hepatitis A vaccine.** / Consult your health care provider.  Hepatitis B vaccine.** / Consult your health care provider.  Haemophilus influenzae type b (Hib) vaccine.** / Consult your health care provider.  Ages 40 years and over  Blood pressure check.** / Every 1 to 2 years.  Lipid and cholesterol check.** / Every 5 years beginning at age 71 years.  Lung cancer screening. / Every year if you are aged 22 80 years and have  a 30-pack-year history of smoking and currently smoke or have quit within the past 15 years. Yearly screening is stopped once you have quit smoking for at least 15 years or develop a health problem that would prevent you from having lung cancer treatment.  Clinical breast exam.** / Every year after age 60 years.  BRCA-related cancer risk assessment.** / For women who have family members with a BRCA-related cancer (breast, ovarian, tubal, or peritoneal cancers).  Mammogram.** / Every year beginning at age 47 years and continuing for as long as you are in good health. Consult with your health care provider.  Pap test.** / Every 3 years starting at age 77 years through age 63 or 28 years with 3 consecutive normal Pap tests. Testing can be stopped between 65 and 70 years with 3 consecutive normal Pap tests and no abnormal Pap or HPV tests in the past 10 years.  HPV screening.** / Every 3 years from ages 42 years through ages 78 or 74 years with a history of 3 consecutive normal Pap tests. Testing can be stopped between 65 and 70 years with 3 consecutive normal Pap tests and no abnormal Pap or HPV tests in the past 10 years.  Fecal occult  blood test (FOBT) of stool. / Every year beginning at age 49 years and continuing until age 47 years. You may not need to do this test if you get a colonoscopy every 10 years.  Flexible sigmoidoscopy or colonoscopy.** / Every 5 years for a flexible sigmoidoscopy or every 10 years for a colonoscopy beginning at age 24 years and continuing until age 71 years.  Hepatitis C blood test.** / For all people born from [birth year] through 1965 and any individual with known risks for hepatitis C.  Osteoporosis screening.** / A one-time screening for women ages 36 years and over and women at risk for fractures or osteoporosis.  Skin self-exam. / Monthly.  Influenza vaccine. / Every year.  Tetanus, diphtheria, and acellular pertussis (Tdap/Td) vaccine.** / 1 dose of Td every 10 years.  Varicella vaccine.** / Consult your health care provider.  Zoster vaccine.** / 1 dose for adults aged 2 years or older.  Pneumococcal 13-valent conjugate (PCV13) vaccine.** / Consult your health care provider.  Pneumococcal polysaccharide (PPSV23) vaccine.** / 1 dose for all adults aged 79 years and older.  Meningococcal vaccine.** / Consult your health care provider.  Hepatitis A vaccine.** / Consult your health care provider.  Hepatitis B vaccine.** / Consult your health care provider.  Haemophilus influenzae type b (Hib) vaccine.** / Consult your health care provider. ** Family history and personal history of risk and conditions may change your health care provider's recommendations. Document Released: 02/22/2001 Document Revised: 10/17/2012  [facility] Patient Information 2014 [place], [place].   EXERCISE AND DIET:  We recommended that you start or continue a regular exercise program for good health. Regular exercise means any activity that makes your heart beat faster and makes you sweat.  We recommend exercising at least 30 minutes per day at least 3 days a week, preferably 5.  We also recommend a diet low in fat  and sugar / carbohydrates.  Inactivity, poor dietary choices and obesity can cause diabetes, heart attack, stroke, and kidney damage, among others.     ALCOHOL AND SMOKING:  Women should limit their alcohol intake to no more than 7 drinks/beers/glasses of wine (combined, not each!) per week. Moderation of alcohol intake to this level decreases your risk of breast cancer and liver damage.  (  And of course, no recreational drugs are part of a healthy lifestyle.)  Also, you should not be smoking at all or even being exposed to second hand smoke. Most people know smoking can cause cancer, and various heart and lung diseases, but did you know it also contributes to weakening of your bones?  Aging of your skin?  Yellowing of your teeth and nails?   CALCIUM AND VITAMIN D:  Adequate intake of calcium and Vitamin D are recommended.  The recommendations for exact amounts of these supplements seem to change often, but generally speaking 600 mg of calcium (either carbonate or citrate) and 800 units of Vitamin D per day seems prudent. Certain women may benefit from higher intake of Vitamin D.  If you are among these women, your doctor will have told you during your visit.     PAP SMEARS:  Pap smears, to check for cervical cancer or precancers,  have traditionally been done yearly, although recent scientific advances have shown that most women can have pap smears less often.  However, every woman still should have a physical exam from her gynecologist or primary care physician every year. It will include a breast check, inspection of the vulva and vagina to check for abnormal growths or skin changes, a visual exam of the cervix, and then an exam to evaluate the size and shape of the uterus and ovaries.  And after 61 years of age, a rectal exam is indicated to check for rectal cancers. We will also provide age appropriate advice regarding health maintenance, like when you should have certain vaccines, screening for  sexually transmitted diseases, bone density testing, colonoscopy, mammograms, etc.    MAMMOGRAMS:  All women over 103 years old should have a yearly mammogram. Many facilities now offer a "3D" mammogram, which may cost around $50 extra out of pocket. If possible,  we recommend you accept the option to have the 3D mammogram performed.  It both reduces the number of women who will be called back for extra views which then turn out to be normal, and it is better than the routine mammogram at detecting truly abnormal areas.     COLONOSCOPY:  Colonoscopy to screen for colon cancer is recommended for all women at age 76.  We know, you hate the idea of the prep.  We agree, BUT, having colon cancer and not knowing it is worse!!  Colon cancer so often starts as a polyp that can be seen and removed at colonscopy, which can quite literally save your life!  And if your first colonoscopy is normal and you have no family history of colon cancer, most women don't have to have it again for 10 years.  Once every ten years, you can do something that may end up saving your life, right?  We will be happy to help you get it scheduled when you are ready.  Be sure to check your insurance coverage so you understand how much it will cost.  It may be covered as a preventative service at no cost, but you should check your particular policy.

## 2016-08-31 ENCOUNTER — Other Ambulatory Visit: Payer: BC Managed Care – PPO

## 2016-08-31 DIAGNOSIS — E669 Obesity, unspecified: Secondary | ICD-10-CM

## 2016-08-31 DIAGNOSIS — E785 Hyperlipidemia, unspecified: Secondary | ICD-10-CM

## 2016-08-31 DIAGNOSIS — Z1159 Encounter for screening for other viral diseases: Secondary | ICD-10-CM

## 2016-08-31 DIAGNOSIS — R7989 Other specified abnormal findings of blood chemistry: Secondary | ICD-10-CM

## 2016-08-31 DIAGNOSIS — E66811 Obesity, class 1: Secondary | ICD-10-CM

## 2016-08-31 DIAGNOSIS — R7303 Prediabetes: Secondary | ICD-10-CM

## 2016-08-31 DIAGNOSIS — Z114 Encounter for screening for human immunodeficiency virus [HIV]: Secondary | ICD-10-CM

## 2016-08-31 DIAGNOSIS — Z8639 Personal history of other endocrine, nutritional and metabolic disease: Secondary | ICD-10-CM

## 2016-08-31 DIAGNOSIS — E7800 Pure hypercholesterolemia, unspecified: Secondary | ICD-10-CM

## 2016-08-31 NOTE — Addendum Note (Signed)
Addended by: Fonnie Mu on: 08/31/2016 08:42 AM   Modules accepted: Orders

## 2016-09-01 LAB — COMPREHENSIVE METABOLIC PANEL
ALBUMIN: 4.4 g/dL (ref 3.6–4.8)
ALK PHOS: 78 IU/L (ref 39–117)
ALT: 17 IU/L (ref 0–32)
AST: 19 IU/L (ref 0–40)
Albumin/Globulin Ratio: 1.8 (ref 1.2–2.2)
BUN / CREAT RATIO: 17 (ref 12–28)
BUN: 14 mg/dL (ref 8–27)
Bilirubin Total: 0.3 mg/dL (ref 0.0–1.2)
CO2: 22 mmol/L (ref 20–29)
CREATININE: 0.84 mg/dL (ref 0.57–1.00)
Calcium: 9.7 mg/dL (ref 8.7–10.3)
Chloride: 104 mmol/L (ref 96–106)
GFR calc Af Amer: 87 mL/min/{1.73_m2} (ref >59)
GFR calc non Af Amer: 75 mL/min/{1.73_m2} (ref >59)
Globulin, Total: 2.5 g/dL (ref 1.5–4.5)
Glucose: 97 mg/dL (ref 65–99)
Potassium: 4.9 mmol/L (ref 3.5–5.2)
Sodium: 143 mmol/L (ref 134–144)
Total Protein: 6.9 g/dL (ref 6.0–8.5)

## 2016-09-01 LAB — CBC WITH DIFFERENTIAL/PLATELET
BASOS ABS: 0 10*3/uL (ref 0.0–0.2)
Basos: 1 %
EOS (ABSOLUTE): 0.1 10*3/uL (ref 0.0–0.4)
Eos: 2 %
HEMOGLOBIN: 13.7 g/dL (ref 11.1–15.9)
Hematocrit: 41.7 % (ref 34.0–46.6)
Immature Grans (Abs): 0 10*3/uL (ref 0.0–0.1)
Immature Granulocytes: 0 %
LYMPHS ABS: 2 10*3/uL (ref 0.7–3.1)
LYMPHS: 40 %
MCH: 30 pg (ref 26.6–33.0)
MCHC: 32.9 g/dL (ref 31.5–35.7)
MCV: 91 fL (ref 79–97)
MONOCYTES: 7 %
Monocytes Absolute: 0.4 10*3/uL (ref 0.1–0.9)
Neutrophils Absolute: 2.5 10*3/uL (ref 1.4–7.0)
Neutrophils: 50 %
PLATELETS: 248 10*3/uL (ref 150–379)
RBC: 4.56 x10E6/uL (ref 3.77–5.28)
RDW: 14 % (ref 12.3–15.4)
WBC: 5.1 10*3/uL (ref 3.4–10.8)

## 2016-09-01 LAB — HEMOGLOBIN A1C
ESTIMATED AVERAGE GLUCOSE: 120 mg/dL
HEMOGLOBIN A1C: 5.8 % — AB (ref 4.8–5.6)

## 2016-09-01 LAB — VITAMIN D 25 HYDROXY (VIT D DEFICIENCY, FRACTURES): Vit D, 25-Hydroxy: 41.2 ng/mL (ref 30.0–100.0)

## 2016-09-01 LAB — TSH+FREE T4
FREE T4: 1.05 ng/dL (ref 0.82–1.77)
TSH: 4.35 u[IU]/mL (ref 0.450–4.500)

## 2016-09-01 LAB — HIV ANTIBODY (ROUTINE TESTING W REFLEX): HIV SCREEN 4TH GENERATION: NONREACTIVE

## 2016-09-01 LAB — LIPID PANEL
CHOL/HDL RATIO: 4.1 ratio (ref 0.0–4.4)
Cholesterol, Total: 240 mg/dL — ABNORMAL HIGH (ref 100–199)
HDL: 59 mg/dL (ref >39)
LDL CALC: 154 mg/dL — AB (ref 0–99)
TRIGLYCERIDES: 133 mg/dL (ref 0–149)
VLDL Cholesterol Cal: 27 mg/dL (ref 5–40)

## 2016-09-01 LAB — HEPATITIS C ANTIBODY

## 2016-09-02 LAB — CYTOLOGY - PAP
DIAGNOSIS: NEGATIVE
HPV (WINDOPATH): NOT DETECTED

## 2016-09-14 ENCOUNTER — Encounter: Payer: Self-pay | Admitting: Family Medicine

## 2016-09-14 ENCOUNTER — Ambulatory Visit (INDEPENDENT_AMBULATORY_CARE_PROVIDER_SITE_OTHER): Payer: BC Managed Care – PPO | Admitting: Family Medicine

## 2016-09-14 VITALS — BP 128/84 | HR 68 | Ht 64.25 in | Wt 192.3 lb

## 2016-09-14 DIAGNOSIS — E559 Vitamin D deficiency, unspecified: Secondary | ICD-10-CM

## 2016-09-14 DIAGNOSIS — E785 Hyperlipidemia, unspecified: Secondary | ICD-10-CM

## 2016-09-14 DIAGNOSIS — R7302 Impaired glucose tolerance (oral): Secondary | ICD-10-CM

## 2016-09-14 MED ORDER — VITAMIN D (ERGOCALCIFEROL) 1.25 MG (50000 UNIT) PO CAPS: 50000.0000 [IU] | ORAL_CAPSULE | ORAL | 10 refills | Status: DC

## 2016-09-14 NOTE — Patient Instructions (Signed)
   Guidelines for a Low Cholesterol, Low Saturated Fat Diet   Fats - Limit total intake of fats and oils. - Avoid butter, stick margarine, shortening, lard, palm and coconut oils. - Limit mayonnaise, salad dressings, gravies and sauces, unless they are homemade with low-fat ingredients. - Limit chocolate. - Choose low-fat and nonfat products, such as low-fat mayonnaise, low-fat or non-hydrogenated peanut butter, low-fat or fat-free salad dressings and nonfat gravy. - Use vegetable oil, such as canola or olive oil. - Look for margarine that does not contain trans fatty acids. - Use nuts in moderate amounts. - Read ingredient labels carefully to determine both amount and type of fat present in foods. Limit saturated and trans fats! - Avoid high-fat processed and convenience foods.  Meats and Meat Alternatives - Choose fish, chicken, turkey and lean meats. - Use dried beans, peas, lentils and tofu. - Limit egg yolks to three to four per week. - If you eat red meat, limit to no more than three servings per week and choose loin or round cuts. - Avoid fatty meats, such as bacon, sausage, franks, luncheon meats and ribs. - Avoid all organ meats, including liver.  Dairy - Choose nonfat or low-fat milk, yogurt and cottage cheese. - Most cheeses are high in fat. Choose cheeses made from non-fat milk, such as mozzarella and ricotta cheese. - Choose light or fat-free cream cheese and sour cream. - Avoid cream and sauces made with cream.  Fruits and Vegetables - Eat a wide variety of fruits and vegetables. - Use lemon juice, vinegar or "mist" olive oil on vegetables. - Avoid adding sauces, fat or oil to vegetables.  Breads, Cereals and Grains - Choose whole-grain breads, cereals, pastas and rice. - Avoid high-fat snack foods, such as granola, cookies, pies, pastries, doughnuts and croissants.  Cooking Tips - Avoid deep fried foods. - Trim visible fat off meats and remove skin from poultry  before cooking. - Bake, broil, boil, poach or roast poultry, fish and lean meats. - Drain and discard fat that drains out of meat as you cook it. - Add little or no fat to foods. - Use vegetable oil sprays to grease pans for cooking or baking. - Steam vegetables. - Use herbs or no-oil marinades to flavor foods.  

## 2016-09-14 NOTE — Progress Notes (Signed)
Assessment and plan:  1. Glucose intolerance (impaired glucose tolerance)   2. Hyperlipidemia, unspecified hyperlipidemia type   3. Vitamin D deficiency     No problem-specific Assessment & Plan notes found for this encounter.     Meds ordered this encounter  Medications  . Vitamin D, Ergocalciferol, (DRISDOL) 50000 units CAPS capsule    Sig: Take 1 capsule (50,000 Units total) by mouth every 7 (seven) days.    Dispense:  12 capsule    Refill:  10    Discontinued Medications   No medications on file    Modified Medications   No medications on file     No orders of the defined types were placed in this encounter.    Return in about 6 months (around 03/14/2017).  Anticipatory guidance and routine counseling done re: condition, txmnt options and need for follow up. All questions of patient's were answered.   Gross side effects, risk and benefits, and alternatives of medications discussed with patient.  Patient is aware that all medications have potential side effects and we are unable to predict every sideeffect or drug-drug interaction that may occur.  Expresses verbal understanding and consents to current therapy plan and treatment regiment.  Please see AVS handed out to patient at the end of our visit for additional patient instructions/ counseling done pertaining to today's office visit.  Note: This document was prepared using Dragon voice recognition software and may include unintentional dictation errors.   ----------------------------------------------------------------------------------------------------------------------  Subjective:   CC:   Paula Conway is a 61 y.o. female who presents to [place] at [facility] today for review and discussion of recent bloodwork that was done.  1. All recent blood work that we ordered was reviewed with patient today.  Patient was  counseled on all abnormalities and we discussed dietary and lifestyle changes that could help those values (also medications when appropriate).  Extensive health counseling performed and all patient's concerns/ questions were addressed.     Wt Readings from Last 3 Encounters:  09/14/16 192 lb 4.8 oz (87.2 kg)  08/30/16 188 lb 11.2 oz (85.6 kg)  02/03/16 194 lb 1.6 oz (88 kg)   BP Readings from Last 3 Encounters:  09/14/16 128/84  08/30/16 138/77  02/03/16 131/81   Pulse Readings from Last 3 Encounters:  09/14/16 68  08/30/16 69  02/03/16 76   BMI Readings from Last 3 Encounters:  09/14/16 32.75 kg/m  08/30/16 32.14 kg/m  02/03/16 33.06 kg/m     Patient Care Team    Relationship Specialty Notifications Start End  Mellody Dance, DO PCP - General Family Medicine  07/16/15     Full medical history updated and reviewed in the office today  Patient Active Problem List   Diagnosis Date Noted  . Obesity, Class I, BMI 30-34.9 08/30/2016    Priority: High  . Prediabetes 07/30/2015    Priority: High  . Elevated LDL cholesterol level 07/30/2015    Priority: High  . Hyperlipidemia 07/20/2015    Priority: High  . Family history of breast cancer in Mother 07/20/2015    Priority: Medium  . GERD (gastroesophageal reflux disease) 07/20/2015    Priority: Medium  . Generalized OA 07/20/2015    Priority: Low  . Environmental and seasonal allergies 07/20/2015    Priority: Low  . Spinal stenosis: Chronic back pain  07/20/2015    Priority: Low  . Glucose intolerance (impaired glucose tolerance) 09/14/2016  . Health  education/counseling 08/30/2016  . Elevated TSH 07/30/2015  . obese 07/20/2015  . Encounter for screening mammogram for breast cancer 07/16/2015  . Neoplasm of connective tissue 11/23/2011    Past Medical History:  Diagnosis Date  . Allergy   . Arthritis   . Environmental and seasonal allergies 07/20/2015  . Family history of breast cancer in Mother 07/20/2015    Mother was diagnosed at 47 years old and died age 62 of breast cancer   . GERD (gastroesophageal reflux disease)   . Hyperlipidemia   . Obesity 07/20/2015  . Spinal stenosis     Past Surgical History:  Procedure Laterality Date  . APPENDECTOMY    . core biopsy      Social History  Substance Use Topics  . Smoking status: Former Smoker    Quit date: 01/11/1975  . Smokeless tobacco: Never Used  . Alcohol use Yes     Comment: socially    Family Hx: Family History  Problem Relation Age of Onset  . Cancer Mother        breast  . Diabetes Father   . Hypertension Father   . Heart disease Father      Medications: Current Outpatient Prescriptions  Medication Sig Dispense Refill  . Ascorbic Acid (VITAMIN C) 1000 MG tablet Take 1,000 mg by mouth daily.    . Cholecalciferol (VITAMIN D3) 5000 units CAPS Take 5,000 Units by mouth daily.    . Glucos-Chond-Hyal Ac-Ca Fructo (MOVE FREE JOINT HEALTH ADVANCE PO) Take 1 tablet by mouth daily.    . Magnesium 250 MG TABS Take 1 tablet by mouth daily.    . Probiotic Product (PROBIOTIC PO) Take 1 tablet by mouth daily.    . vitamin B-12 (CYANOCOBALAMIN) 250 MCG tablet Take 250 mcg by mouth daily.    . Vitamin D, Ergocalciferol, (DRISDOL) 50000 units CAPS capsule Take 1 capsule (50,000 Units total) by mouth every 7 (seven) days. 12 capsule 10   No current facility-administered medications for this visit.     Allergies:  No Known Allergies   Review of Systems: General:   No F/C, wt loss Pulm:   No DIB, SOB, pleuritic chest pain Card:  No CP, palpitations Abd:  No n/v/d or pain Ext:  No inc edema from baseline  Objective:  Blood pressure 128/84, pulse 68, height 5' 4.25" (1.632 m), weight 192 lb 4.8 oz (87.2 kg). Body mass index is 32.75 kg/m. Gen:   Well NAD, A and O *3 HEENT:    [place]/AT, EOMI,  MMM Lungs:   Normal work of breathing. CTA B/L, no Wh, rhonchi Heart:   RRR, S1, S2 WNL's, no MRG Abd:   No gross distention Exts:     warm, pink,  Brisk capillary refill, warm and well perfused.  Psych:    No HI/SI, judgement and insight good, Euthymic mood. Full Affect.   Recent Results (from the past 2160 hour(s))  Cytology - PAP     Status: None   Collection Time: 08/30/16 12:00 AM  Result Value Ref Range   Adequacy      Satisfactory for evaluation  endocervical/transformation zone component PRESENT.   Diagnosis      NEGATIVE FOR INTRAEPITHELIAL LESIONS OR MALIGNANCY.   HPV NOT DETECTED     Comment: Normal Reference Range - NOT Detected   Material Submitted CervicoVaginal Pap [ThinPrep Imaged]    CYTOLOGY - PAP PAP RESULT   POC Hemoccult Bld/Stl (1-Cd Office Dx)     Status: Normal  Collection Time: 08/30/16  2:00 PM  Result Value Ref Range   Card #1 Date 08/30/2016    Fecal Occult Blood, POC Negative Negative  TSH + free T4     Status: None   Collection Time: 08/31/16  8:40 AM  Result Value Ref Range   TSH 4.350 0.450 - 4.500 uIU/mL   Free T4 1.05 0.82 - 1.77 ng/dL  CBC with Differential/Platelet     Status: None   Collection Time: 08/31/16  8:40 AM  Result Value Ref Range   WBC 5.1 3.4 - 10.8 x10E3/uL   RBC 4.56 3.77 - 5.28 x10E6/uL   Hemoglobin 13.7 11.1 - 15.9 g/dL   Hematocrit 41.7 34.0 - 46.6 %   MCV 91 79 - 97 fL   MCH 30.0 26.6 - 33.0 pg   MCHC 32.9 31.5 - 35.7 g/dL   RDW 14.0 12.3 - 15.4 %   Platelets 248 150 - 379 x10E3/uL   Neutrophils 50 Not Estab. %   Lymphs 40 Not Estab. %   Monocytes 7 Not Estab. %   Eos 2 Not Estab. %   Basos 1 Not Estab. %   Neutrophils Absolute 2.5 1.4 - 7.0 x10E3/uL   Lymphocytes Absolute 2.0 0.7 - 3.1 x10E3/uL   Monocytes Absolute 0.4 0.1 - 0.9 x10E3/uL   EOS (ABSOLUTE) 0.1 0.0 - 0.4 x10E3/uL   Basophils Absolute 0.0 0.0 - 0.2 x10E3/uL   Immature Granulocytes 0 Not Estab. %   Immature Grans (Abs) 0.0 0.0 - 0.1 x10E3/uL  Comprehensive metabolic panel     Status: None   Collection Time: 08/31/16  8:40 AM  Result Value Ref Range   Glucose 97 65 - 99 mg/dL    BUN 14 8 - 27 mg/dL   Creatinine, Ser 0.84 0.57 - 1.00 mg/dL   GFR calc non Af Amer 75 >59 mL/min/1.73   GFR calc Af Amer 87 >59 mL/min/1.73   BUN/Creatinine Ratio 17 12 - 28   Sodium 143 134 - 144 mmol/L   Potassium 4.9 3.5 - 5.2 mmol/L   Chloride 104 96 - 106 mmol/L   CO2 22 20 - 29 mmol/L   Calcium 9.7 8.7 - 10.3 mg/dL   Total Protein 6.9 6.0 - 8.5 g/dL   Albumin 4.4 3.6 - 4.8 g/dL   Globulin, Total 2.5 1.5 - 4.5 g/dL   Albumin/Globulin Ratio 1.8 1.2 - 2.2   Bilirubin Total 0.3 0.0 - 1.2 mg/dL   Alkaline Phosphatase 78 39 - 117 IU/L   AST 19 0 - 40 IU/L   ALT 17 0 - 32 IU/L  Hemoglobin A1c     Status: Abnormal   Collection Time: 08/31/16  8:40 AM  Result Value Ref Range   Hgb A1c MFr Bld 5.8 (H) 4.8 - 5.6 %    Comment:          Prediabetes: 5.7 - 6.4          Diabetes: >6.4          Glycemic control for adults with diabetes: <7.0    Est. average glucose Bld gHb Est-mCnc 120 mg/dL  HIV antibody     Status: None   Collection Time: 08/31/16  8:40 AM  Result Value Ref Range   HIV Screen 4th Generation wRfx Non Reactive Non Reactive  Hepatitis C antibody     Status: None   Collection Time: 08/31/16  8:40 AM  Result Value Ref Range   Hep C Virus Ab <0.1 0.0 - 0.9 s/co  ratio    Comment:                                   Negative:     < 0.8                              Indeterminate: 0.8 - 0.9                                   Positive:     > 0.9  The CDC recommends that a positive HCV antibody result  be followed up with a HCV Nucleic Acid Amplification  test (433295).   VITAMIN D 25 Hydroxy (Vit-D Deficiency, Fractures)     Status: None   Collection Time: 08/31/16  8:40 AM  Result Value Ref Range   Vit D, 25-Hydroxy 41.2 30.0 - 100.0 ng/mL    Comment: Vitamin D deficiency has been defined by the [place] practice guideline as a level of serum 25-OH vitamin D less than 20 ng/mL (1,2). The Endocrine Society went on to further define  vitamin D insufficiency as a level between 21 and 29 ng/mL (2). 1. IOM (Institute of Medicine). 2010. Dietary reference    intakes for calcium and D. [place]: The    [employer]. 2. Holick MF, Binkley [place], Bischoff-Ferrari HA, et al.    Evaluation, treatment, and prevention of vitamin D    deficiency: an Endocrine Society clinical practice    guideline. JCEM. 2011 Jul; 96(7):1911-30.   Lipid panel     Status: Abnormal   Collection Time: 08/31/16  8:44 AM  Result Value Ref Range   Cholesterol, Total 240 (H) 100 - 199 mg/dL   Triglycerides 133 0 - 149 mg/dL   HDL 59 >39 mg/dL   VLDL Cholesterol Cal 27 5 - 40 mg/dL   LDL Calculated 154 (H) 0 - 99 mg/dL   Chol/HDL Ratio 4.1 0.0 - 4.4 ratio    Comment:                                   T. Chol/HDL Ratio                                             Men  Women                               1/2 Avg.Risk  3.4    3.3                                   Avg.Risk  5.0    4.4                                2X Avg.Risk  9.6    7.1  3X Avg.Risk 23.4   11.0

## 2016-09-16 ENCOUNTER — Ambulatory Visit
Admission: RE | Admit: 2016-09-16 | Discharge: 2016-09-16 | Disposition: A | Discharge status: Home / Self Care | Payer: BC Managed Care – PPO | Source: Ambulatory Visit | Attending: Family Medicine | Admitting: Family Medicine

## 2016-09-16 DIAGNOSIS — Z1231 Encounter for screening mammogram for malignant neoplasm of breast: Secondary | ICD-10-CM

## 2017-02-15 ENCOUNTER — Ambulatory Visit (INDEPENDENT_AMBULATORY_CARE_PROVIDER_SITE_OTHER): Payer: BC Managed Care – PPO | Admitting: Family Medicine

## 2017-02-15 ENCOUNTER — Encounter: Payer: Self-pay | Admitting: Family Medicine

## 2017-02-15 VITALS — BP 136/78 | HR 76 | Ht 64.25 in | Wt 198.7 lb

## 2017-02-15 DIAGNOSIS — M4800 Spinal stenosis, site unspecified: Secondary | ICD-10-CM

## 2017-02-15 DIAGNOSIS — M47819 Spondylosis without myelopathy or radiculopathy, site unspecified: Secondary | ICD-10-CM | POA: Insufficient documentation

## 2017-02-15 DIAGNOSIS — M5416 Radiculopathy, lumbar region: Secondary | ICD-10-CM | POA: Diagnosis not present

## 2017-02-15 DIAGNOSIS — N3000 Acute cystitis without hematuria: Secondary | ICD-10-CM

## 2017-02-15 DIAGNOSIS — M4699 Unspecified inflammatory spondylopathy, multiple sites in spine: Secondary | ICD-10-CM

## 2017-02-15 DIAGNOSIS — M545 Low back pain, unspecified: Secondary | ICD-10-CM | POA: Insufficient documentation

## 2017-02-15 DIAGNOSIS — M5126 Other intervertebral disc displacement, lumbar region: Secondary | ICD-10-CM | POA: Diagnosis not present

## 2017-02-15 DIAGNOSIS — R3915 Urgency of urination: Secondary | ICD-10-CM

## 2017-02-15 LAB — POCT URINALYSIS DIPSTICK
Bilirubin, UA: NEGATIVE
Blood, UA: NEGATIVE
Glucose, UA: NEGATIVE
KETONES UA: NEGATIVE
NITRITE UA: POSITIVE
PH UA: 6.5 (ref 5.0–8.0)
PROTEIN UA: NEGATIVE
SPEC GRAV UA: 1.015 (ref 1.010–1.025)
UROBILINOGEN UA: 0.2 U/dL

## 2017-02-15 MED ORDER — NITROFURANTOIN MONOHYD MACRO 100 MG PO CAPS: 100.0000 mg | ORAL_CAPSULE | Freq: Two times a day (BID) | ORAL | 0 refills | Status: DC

## 2017-02-15 MED ORDER — PREDNISONE 20 MG PO TABS: ORAL_TABLET | ORAL | 0 refills | Status: DC

## 2017-02-15 MED ORDER — PREGABALIN 75 MG PO CAPS: 75.0000 mg | ORAL_CAPSULE | Freq: Two times a day (BID) | ORAL | 3 refills | Status: DC

## 2017-02-15 NOTE — Patient Instructions (Signed)
We will call you will results of the urine culture in 5 d or so.    Acute Pain, Adult Acute pain is a type of pain that may last for just a few days or as long as six months. It is often related to an illness, injury, or medical procedure. Acute pain may be mild, moderate, or severe. It usually goes away once your injury has healed or you are no longer ill. Pain can make it hard for you to do daily activities. It can cause anxiety and lead to other problems if left untreated. Treatment depends on the cause and severity of your acute pain. Follow these instructions at home:  Check your pain level as told by your health care provider.  Take over-the-counter and prescription medicines only as told by your health care provider.  If you are taking prescription pain medicine: ? Ask your health care provider about taking a stool softener or laxative to prevent constipation. ? Do not stop taking the medicine suddenly. Talk to your health care provider about how and when to discontinue prescription pain medicine. ? If your pain is severe, do not take more pills than instructed by your health care provider. ? Do not take other over-the-counter pain medicines in addition to this medicine unless told by your health care provider. ? Do not drive or operate heavy machinery while taking prescription pain medicine.  Apply ice or heat as told by your health care provider. These may reduce swelling and pain.  Ask your health care provider if other strategies such as distraction, relaxation, or physical therapies can help your pain.  Keep all follow-up visits as told by your health care provider. This is important. Contact a health care provider if:  You have pain that is not controlled by medicine.  Your pain does not improve or gets worse.  You have side effects from pain medicines, such as vomitingor confusion. Get help right away if:  You have severe pain.  You have trouble breathing.  You  lose consciousness.  You have chest pain or pressure that lasts for more than a few minutes. Along with the chest pain you may: ? Have pain or discomfort in one or both arms, your back, neck, jaw, or stomach. ? Have shortness of breath. ? Break out in a cold sweat. ? Feel nauseous. ? Become light-headed. These symptoms may represent a serious problem that is an emergency. Do not wait to see if the symptoms will go away. Get medical help right away. Call your local emergency services (911 in the [place] Do not drive yourself to the hospital. This information is not intended to replace advice given to you by your health care provider. Make sure you discuss any questions you have with your health care provider. Document Released: 01/11/2015 Document Revised: 06/05/2015 Document Reviewed: 01/11/2015 Elsevier Interactive Patient Education  [employer].

## 2017-02-15 NOTE — Progress Notes (Signed)
Subjective:   HPI: Acute back pain Chronic back pain since 2001, but these symptoms have flared up and worsened for 3-4 days. Her pain is 7/10 currently. She has a known lumbar stenosis. This is off and on. She went to an exercise class 2-3 days ago and was limping afterwards due to pain. She has taken ibuprofen 600mg , aleve, doing stretches and yoga, and applying heat with symptom relief. Her pain is more on the L side on the inside of her thigh to behind her knee. This radiates into the back of her calf and not beyond. She also gets a cramp or "charlie horse" on her R thigh, but never beyond the R knee.  Bending over makes it feel better, but bending backwards makes it worse. She was previously taking 300mg  Gabapentin (prescribed by orthopedist) at night and tolerated this fine, despite making it drowsy for her.   Pt's previous MRI study done in March 2008, she was found to have moderate severe stenosis l4-l5 due to combination congenitally shallow spinal canal and degenerative disc as well as facet changes. She has facet arthropathy most prominent at the exiting left L4 root, small left paracentral disc protrusion L5-S1, this abuts the proximal L S1 root.   HPI: Paula Conway is a 62 y.o. female who presents to [place] at [facility] today for c/o urinary urgency.   Pt has a h/o ulcers on her kidneys as well as recurrent UTI. She also states her dad had a h/o UTI.  Sx for 3-4 days.     C/O: Urinary urgency, tingling (while resting, not when urinating), and episodic suprapubic pain when she has urgency.   Her symptoms are relieved when she goes to the bathroom.  Denies: Dysuria, fever, chills, nausea, vomiting.  Urinalysis    Component Value Date/Time   BILIRUBINUR negative 02/15/2017 1455   PROTEINUR negative 02/15/2017 1455   UROBILINOGEN 0.2 02/15/2017 1455   NITRITE positive 02/15/2017 1455   LEUKOCYTESUR Trace (A) 02/15/2017 1455    Wt Readings  from Last 3 Encounters:  03/15/17 203 lb (92.1 kg)  02/15/17 198 lb 11.2 oz (90.1 kg)  09/14/16 192 lb 4.8 oz (87.2 kg)   BP Readings from Last 3 Encounters:  03/15/17 123/83  02/15/17 136/78  09/14/16 128/84   Pulse Readings from Last 3 Encounters:  03/15/17 72  02/15/17 76  09/14/16 68   BMI Readings from Last 3 Encounters:  03/15/17 34.57 kg/m  02/15/17 33.84 kg/m  09/14/16 32.75 kg/m     Patient Active Problem List   Diagnosis Date Noted  . Obesity, Class I, BMI 30-34.9 08/30/2016    Priority: High  . Prediabetes 07/30/2015    Priority: High  . Elevated LDL cholesterol level 07/30/2015    Priority: High  . Hyperlipidemia 07/20/2015    Priority: High  . Family history of breast cancer in Mother 07/20/2015    Priority: Medium  . GERD (gastroesophageal reflux disease) 07/20/2015    Priority: Medium  . Generalized OA 07/20/2015    Priority: Low  . Environmental and seasonal allergies 07/20/2015    Priority: Low  . Spinal stenosis: Chronic back pain  07/20/2015    Priority: Low  . Vitamin D insufficiency 03/15/2017  . Lumbar discogenic pain syndrome 02/15/2017  . Radiculopathy, lumbar region 02/15/2017  . Arthritis of facet joints at multiple vertebral levels ([place]) 02/15/2017  . Low back pain 02/15/2017  . Glucose intolerance (impaired glucose tolerance) 09/14/2016  .  Health education/counseling 08/30/2016  . Elevated TSH 07/30/2015  . Encounter for screening mammogram for breast cancer 07/16/2015  . Neoplasm of connective tissue 11/23/2011    Past Surgical History:  Procedure Laterality Date  . APPENDECTOMY    . core biopsy      Family History  Problem Relation Age of Onset  . Cancer Mother        breast  . Diabetes Father   . Hypertension Father   . Heart disease Father     Social History   Substance and Sexual Activity  Drug Use No  ,  Social History   Substance and Sexual Activity  Alcohol Use Yes   Comment: socially  ,  Social  History   Tobacco Use  Smoking Status Former Smoker  . Last attempt to quit: 01/11/1975  . Years since quitting: 42.2  Smokeless Tobacco Never Used  ,  Social History   Substance and Sexual Activity  Sexual Activity Yes    Patient's Medications  New Prescriptions   PREGABALIN (LYRICA) 75 MG CAPSULE    Take 1 capsule (75 mg total) by mouth 2 (two) times daily.  Previous Medications   ASCORBIC ACID (VITAMIN C) 1000 MG TABLET    Take 1,000 mg by mouth daily.   CHOLECALCIFEROL (VITAMIN D3) 5000 UNITS CAPS    Take 5,000 Units by mouth daily.   GLUCOS-CHOND-HYAL AC-CA FRUCTO (MOVE FREE JOINT HEALTH ADVANCE PO)    Take 1 tablet by mouth daily.   MAGNESIUM 250 MG TABS    Take 1 tablet by mouth daily.   PROBIOTIC PRODUCT (PROBIOTIC PO)    Take 1 tablet by mouth daily.   VITAMIN B-12 (CYANOCOBALAMIN) 250 MCG TABLET    Take 250 mcg by mouth daily.   VITAMIN D, ERGOCALCIFEROL, (DRISDOL) 50000 UNITS CAPS CAPSULE    Take 1 capsule (50,000 Units total) by mouth every 7 (seven) days.  Modified Medications   No medications on file  Discontinued Medications   NITROFURANTOIN, MACROCRYSTAL-MONOHYDRATE, (MACROBID) 100 MG CAPSULE    Take 1 capsule (100 mg total) by mouth 2 (two) times daily.   PREDNISONE (DELTASONE) 20 MG TABLET    Take 3 tabs po * 2 days, then 2 tabs for 2 d, then 1 tab 2 d, then 1/2 tab 2 days.    Patient has no known allergies.  Current Meds  Medication Sig  . Ascorbic Acid (VITAMIN C) 1000 MG tablet Take 1,000 mg by mouth daily.  . Cholecalciferol (VITAMIN D3) 5000 units CAPS Take 5,000 Units by mouth daily.  . Magnesium 250 MG TABS Take 1 tablet by mouth daily.  . vitamin B-12 (CYANOCOBALAMIN) 250 MCG tablet Take 250 mcg by mouth daily.  . Vitamin D, Ergocalciferol, (DRISDOL) 50000 units CAPS capsule Take 1 capsule (50,000 Units total) by mouth every 7 (seven) days.    Review of Systems: General:   No F/C, wt loss Pulm:   No DIB, pleuritic chest pain Card:  No CP,  palpitations Abd:  No n/v/d or pain GU:  Dysuria, increased frequency and urgency; no vaginal discharge Ext:  No inc edema from baseline   Objective:  Blood pressure 136/78, pulse 76, height 5' 4.25" (1.632 m), weight 198 lb 11.2 oz (90.1 kg), SpO2 98 %. Body mass index is 33.84 kg/m.  General: Well Developed, well nourished, and in no acute distress.  HEENT: Normocephalic, atraumatic Skin: Warm and dry, cap RF less 2 sec, good turgor CV: +S1, S2 Respiratory: ECTA B/L; speaking in  full sentences, no conversational dyspnea Abd: Soft, NT, ND, No G/R/R, no SPT, No flank pain NeuroM-Sk: Ambulates w/o assistance, moves * 4.  Back: From L1-L4 on L side back with paraspinal muscle spasms. No bony tenderness. 10 degree lumbar extension causes pain. Positive straight leg raise on the L. Negative straight leg raise on R. Psych: A and O *3    Impression and Recommendations:    1. Low back pain, unspecified back pain laterality, unspecified chronicity, with sciatica presence unspecified   2. Urgency of urination   3. Spinal stenosis, unspecified spinal region   4. Arthritis of facet joints at multiple vertebral levels (HCC)   5. Radiculopathy, lumbar region   6. Lumbar discogenic pain syndrome   7. Acute cystitis without hematuria     1. Low back pain -referred pt for [facility] certified physical therapist who specializes in back pain. -Discussed with pt potential referral to orthopedist. If symptoms worsen and do not improve after starting meds and PT, we will refer to orthopedist, to which pt agreed. -strongly recommended pt to not go to aerobic exercise classes. Instructed pt to walk on a forgiving surface and continue doing stretches.   2. Urgency of urination- Take OTC medications for symptom relief.  3. Spinal stenosis - start meds.  4. Arthritis of facet joints- start meds.  5. Radiculopathy, lumbar- start meds.  6. Lumbar discogenic pain syndrome- start meds   7. Acute  cystitis without hematuria- Await urine culture. Start abx per pt request, will change if needed. Use OTC medications for symptom relief.  -Follow up for chronic care as previously discussed. Keep your appointment next month.    Education and routine counseling performed. Handouts provided.  Orders Placed This Encounter  Procedures  . Urine Culture  . Ambulatory referral to Physical Therapy  . POCT urinalysis dipstick    Meds ordered this encounter  Medications  . pregabalin (LYRICA) 75 MG capsule    Sig: Take 1 capsule (75 mg total) by mouth 2 (two) times daily.    Dispense:  60 capsule    Refill:  3  . DISCONTD: predniSONE (DELTASONE) 20 MG tablet    Sig: Take 3 tabs po * 2 days, then 2 tabs for 2 d, then 1 tab 2 d, then 1/2 tab 2 days.    Dispense:  15 tablet    Refill:  0  . DISCONTD: nitrofurantoin, macrocrystal-monohydrate, (MACROBID) 100 MG capsule    Sig: Take 1 capsule (100 mg total) by mouth 2 (two) times daily.    Dispense:  10 capsule    Refill:  0    The patient was counseled, risk factors were discussed, anticipatory guidance given.  Gross side effects, risk and benefits, and alternatives of medications discussed with patient.  Patient is aware that all medications have potential side effects and we are unable to predict every side effect or drug-drug interaction that may occur.  Expresses verbal understanding and consents to current therapy plan and treatment regimen.   Return if symptoms worsen or fail to improve, for also please f/up chronic care as previously discussed.   This document serves as a record of services personally performed by Mellody Dance, DO. It was created on her behalf by Mayer Masker, a trained medical scribe. The creation of this record is based on the scribe's personal observations and the provider's statements to them.   I have reviewed the above medical documentation for accuracy and completeness and I concur.  Mellody Dance 03/16/17  9:02 AM

## 2017-02-17 LAB — URINE CULTURE

## 2017-02-20 ENCOUNTER — Encounter: Payer: Self-pay | Admitting: Physical Therapy

## 2017-02-20 ENCOUNTER — Ambulatory Visit: Payer: BC Managed Care – PPO | Attending: Family Medicine | Admitting: Physical Therapy

## 2017-02-20 DIAGNOSIS — M5441 Lumbago with sciatica, right side: Secondary | ICD-10-CM | POA: Diagnosis present

## 2017-02-20 DIAGNOSIS — M5442 Lumbago with sciatica, left side: Secondary | ICD-10-CM | POA: Diagnosis present

## 2017-02-20 DIAGNOSIS — R293 Abnormal posture: Secondary | ICD-10-CM | POA: Insufficient documentation

## 2017-02-20 DIAGNOSIS — M6281 Muscle weakness (generalized): Secondary | ICD-10-CM | POA: Diagnosis present

## 2017-02-20 NOTE — Therapy (Signed)
[place], [place], [address] Phone: [phone]   Fax:  [phone]  Physical Therapy Evaluation  Patient Details  Name: Paula Conway MRN: [id number] Date of Birth: 02/03/1955 Referring Provider: Mellody Dance, DO   Encounter Date: 02/20/2017  PT End of Session - 02/20/17 0839    Visit Number  1    Number of Visits  12    Date for PT Re-Evaluation  04/03/17    Authorization Type  [employer]    PT Start Time  0758    PT Stop Time  0836    PT Time Calculation (min)  38 min    Activity Tolerance  Patient tolerated treatment well    Behavior During Therapy  [facility] for tasks assessed/performed       Past Medical History:  Diagnosis Date  . Allergy   . Arthritis   . Environmental and seasonal allergies 07/20/2015  . Family history of breast cancer in Mother 07/20/2015   Mother was diagnosed at 68 years old and died age 57 of breast cancer   . GERD (gastroesophageal reflux disease)   . Hyperlipidemia   . Obesity 07/20/2015  . Spinal stenosis     Past Surgical History:  Procedure Laterality Date  . APPENDECTOMY    . core biopsy      There were no vitals filed for this visit.   Subjective Assessment - 02/20/17 0801    Subjective  Pt is a 62 y/o female who presents to OPPT with Lt > Rt LBP.  Pt dx in 2008 with spinal stenosis and reports exacerbation x 2-3 weeks ago.  Pt has had injections, but none recently due to recall of injection.  Pt was attending yoga (for > 31 y/o), and also going to bootcamp and feels that may have aggravated her back.    Pertinent History  spinal stenosis, obesity    Limitations  Standing;Walking    How long can you stand comfortably?  couple minutes without bending forward    How long can you walk comfortably?  5 min    Patient Stated Goals  improve pain, be more comfortable    Currently in Pain?  Yes    Pain Score  7  up to "12"    Pain Location  Back    Pain Orientation   Left;Right Lt>Rt    Pain Descriptors / Indicators  Sharp    Pain Type  Acute pain;Chronic pain    Pain Radiating Towards  to Lt calf    Pain Onset  1 to 4 weeks ago    Aggravating Factors   night time, end range forward bending, extension    Pain Relieving Factors  exercises from previous PT (only feels relief x 10 min), heating pad, massages monthly         [facility] PT Assessment - 02/20/17 0809      Assessment   Medical Diagnosis  LBP, spinal stenosis    Referring Provider  Mellody Dance, DO    Onset Date/Surgical Date  02/10/17    Next MD Visit  03/15/17    Prior Therapy  2012      Precautions   Precautions  None      Restrictions   Weight Bearing Restrictions  No      Balance Screen   Has the patient fallen in the past 6 months  No    Has the patient had a decrease in activity level because of a fear  of falling?   No    Is the patient reluctant to leave their home because of a fear of falling?   No      Home Environment   Living Environment  Private residence    Living Arrangements  Spouse/significant other    Type of [place] to enter    Entrance Stairs-Number of Steps  3    Entrance Stairs-Rails  None    Home Layout  Two level;Bed/bath upstairs    Alternate Level Stairs-Number of Steps  15    Alternate Level Stairs-Rails  Left    Home Equipment  None      Prior Function   Level of Independence  Independent    Vocation  Retired    Leisure  yoga, spend time with grandchildren      Cognition   Overall Cognitive Status  Within Functional Limits for tasks assessed      Observation/Other Assessments   Focus on Therapeutic Outcomes (FOTO)   51 (49% limited; predicted 29% limited)      Posture/Postural Control   Posture/Postural Control  Postural limitations    Postural Limitations  Rounded Shoulders;Forward head;Decreased lumbar lordosis      ROM / Strength   AROM / PROM / Strength  AROM;Strength      AROM   AROM Assessment Site   Lumbar    Lumbar Flexion  108    Lumbar Extension  9    Lumbar - Right Side Bend  32    Lumbar - Left Side Bend  30 with pain      Strength   Strength Assessment Site  Hip;Knee;Ankle    Right/Left Hip  Right;Left    Right Hip Flexion  4/5    Right Hip Extension  3/5    Right Hip ABduction  4/5    Left Hip Flexion  3+/5    Left Hip Extension  3/5    Left Hip ABduction  3/5    Right/Left Knee  Right;Left    Right Knee Flexion  5/5    Right Knee Extension  5/5    Left Knee Flexion  4/5    Left Knee Extension  5/5    Right/Left Ankle  Right;Left    Right Ankle Dorsiflexion  5/5    Left Ankle Dorsiflexion  5/5      Flexibility   Soft Tissue Assessment /Muscle Length  yes    Hamstrings  WNL    Piriformis  tightness bil    Quadratus Lumborum  tightness bil      Palpation   Palpation comment  tenderness along thoracic and lumbar paraspinals; QL tightness      Special Tests    Special Tests  Lumbar    Lumbar Tests  Straight Leg Raise      Straight Leg Raise   Findings  Negative      Ambulation/Gait   Gait Pattern  Antalgic             Objective measurements completed on examination: See above findings.      [facility] Adult PT Treatment/Exercise - 02/20/17 0809      Self-Care   Self-Care  Other Self-Care Comments    Other Self-Care Comments   verbally reviewed piriformis stretch and use of tennis ball             PT Education - 02/20/17 0839    Education provided  Yes    Education Details  HEP, tennis ball for STM    Person(s) Educated  Patient    Methods  Explanation;Demonstration;Handout    Comprehension  Need further instruction;Verbalized understanding;Returned demonstration          PT Long Term Goals - 02/20/17 1202      PT LONG TERM GOAL #1   Title  independent with HEP    Status  New    Target Date  04/03/17      PT LONG TERM GOAL #2   Title  verbalize understanding of posture/body mechanics to decrease risk of reinjury    Status   New    Target Date  04/03/17      PT LONG TERM GOAL #3   Title  report ability to walk > 15 min without increase in pain for improved function    Status  New    Target Date  04/03/17      PT LONG TERM GOAL #4   Title  improve bil hip strength to at least 4/5 for improved stability and function    Status  New    Target Date  04/03/17             Plan - 02/20/17 1156    Clinical Impression Statement  Pt is a 62 y/o female who presents to OPPT for chronic with acute exacerbation of LBP Lt>Rt.  Pt demonstrates decreased strength, decreased flexibility, postural abnormalities and active trigger points affecting functional mobility.  Pt will benefit from PT to address deficits listed.  Pt brought previous HEP with her today and briefly looked at exercises, recommended she continue her stretches and added piriformis stretch today, but plan to consolidate HEP into 1 program for her.  Pt will benefit from PT to address deficits listed.     History and Personal Factors relevant to plan of care:  stenosis, obesity, arthritis    Clinical Presentation  Evolving    Clinical Presentation due to:  intermittent radicular pain; now with Rt side involvement    Clinical Decision Making  Moderate    Rehab Potential  Good    PT Frequency  2x / week    PT Duration  6 weeks    PT Treatment/Interventions  ADLs/Self Care Home Management;Moist Heat;Electrical Stimulation;Cryotherapy;Ultrasound;Traction;Gait training;Stair training;Functional mobility training;Therapeutic activities;Therapeutic exercise;Balance training;Neuromuscular re-education;Patient/family education;Manual techniques;Dry needling;Taping    PT Next Visit Plan  review piriformis stretch, help consolidate HEP to include flexion biased and core strengthening    Consulted and Agree with Plan of Care  Patient       Patient will benefit from skilled therapeutic intervention in order to improve the following deficits and impairments:  Abnormal  gait, Pain, Postural dysfunction, Impaired flexibility, Increased fascial restricitons, Increased muscle spasms, Improper body mechanics, Decreased strength, Decreased mobility, Difficulty walking  Visit Diagnosis: Acute bilateral low back pain with bilateral sciatica - Plan: PT plan of care cert/re-cert  Abnormal posture - Plan: PT plan of care cert/re-cert  Muscle weakness (generalized) - Plan: PT plan of care cert/re-cert     Problem List Patient Active Problem List   Diagnosis Date Noted  . Lumbar discogenic pain syndrome 02/15/2017  . Radiculopathy, lumbar region 02/15/2017  . Arthritis of facet joints at multiple vertebral levels ([place]) 02/15/2017  . Low back pain 02/15/2017  . Glucose intolerance (impaired glucose tolerance) 09/14/2016  . Obesity, Class I, BMI 30-34.9 08/30/2016  . Health education/counseling 08/30/2016  . Prediabetes 07/30/2015  . Elevated LDL cholesterol level 07/30/2015  . Elevated TSH 07/30/2015  .  obese 07/20/2015  . Family history of breast cancer in Mother 07/20/2015  . GERD (gastroesophageal reflux disease) 07/20/2015  . Generalized OA 07/20/2015  . Hyperlipidemia 07/20/2015  . Environmental and seasonal allergies 07/20/2015  . Spinal stenosis: Chronic back pain  07/20/2015  . Encounter for screening mammogram for breast cancer 07/16/2015  . Neoplasm of connective tissue 11/23/2011      Laureen Abrahams, PT, DPT 02/20/17 12:06 PM    [facility] [address] [place], [place], [address] Phone: [phone]   Fax:  [phone]  Name: ALNISA HASLEY MRN: [id number] Date of Birth: April 21, 1955

## 2017-02-20 NOTE — Patient Instructions (Signed)
Piriformis Stretch    Lying on back, pull right knee toward opposite shoulder. Hold _30___ seconds.  Repeat with left leg. Repeat _2-3_ times. Do _2-3___ sessions per day.    Use your tennis ball to help with the tight muscles.

## 2017-02-23 ENCOUNTER — Encounter: Payer: Self-pay | Admitting: Family Medicine

## 2017-02-23 ENCOUNTER — Encounter: Payer: Self-pay | Admitting: Physical Therapy

## 2017-02-23 ENCOUNTER — Ambulatory Visit: Payer: BC Managed Care – PPO | Admitting: Physical Therapy

## 2017-02-23 DIAGNOSIS — M6281 Muscle weakness (generalized): Secondary | ICD-10-CM

## 2017-02-23 DIAGNOSIS — M5442 Lumbago with sciatica, left side: Principal | ICD-10-CM

## 2017-02-23 DIAGNOSIS — R293 Abnormal posture: Secondary | ICD-10-CM

## 2017-02-23 DIAGNOSIS — M5441 Lumbago with sciatica, right side: Secondary | ICD-10-CM

## 2017-02-23 NOTE — Therapy (Signed)
[place], [place], [address] Phone: [phone]   Fax:  [phone]  Physical Therapy Treatment  Patient Details  Name: Paula Conway MRN: [id number] Date of Birth: [birth year] Referring Provider: Mellody Dance, DO   Encounter Date: 02/23/2017  PT End of Session - 02/23/17 0929    Visit Number  2    Number of Visits  12    Date for PT Re-Evaluation  04/03/17    PT Start Time  0847    PT Stop Time  0929    PT Time Calculation (min)  42 min    Activity Tolerance  Patient tolerated treatment well    Behavior During Therapy  [facility] for tasks assessed/performed       Past Medical History:  Diagnosis Date  . Allergy   . Arthritis   . Environmental and seasonal allergies 07/20/2015  . Family history of breast cancer in Mother 07/20/2015   Mother was diagnosed at 71 years old and died age 67 of breast cancer   . GERD (gastroesophageal reflux disease)   . Hyperlipidemia   . Obesity 07/20/2015  . Spinal stenosis     Past Surgical History:  Procedure Laterality Date  . APPENDECTOMY    . core biopsy      There were no vitals filed for this visit.  Subjective Assessment - 02/23/17 1147    Subjective  Pain starts out fine then it returns.  It eases with exercises she does 4 x a day on the floor.    Patient presents with forward trunk lean with lateral shift.    Currently in Pain?  Yes    Pain Score  2     Pain Location  Back    Pain Orientation  Left;Right;Lower    Pain Descriptors / Indicators  Spasm grabs    Pain Type  Chronic pain;Acute pain    Pain Radiating Towards  not today    Pain Frequency  Intermittent    Aggravating Factors   night,  forward bend,  standing housework    Pain Relieving Factors  stretches on floor    Multiple Pain Sites  -- left knee pain                      [facility] Adult PT Treatment/Exercise - 02/23/17 0001      Self-Care   Other Self-Care Comments    ADL handout review      Lumbar Exercises: Supine   Ab Set  -- cued    Pelvic Tilt  5 reps;5 seconds;1 rep AND 1 REP WITH 5 BREATHS.  CUED     Clam  5 reps 3 sets single and both,  cued technique    Heel Slides  5 reps 2 SETS, HEP    Bent Knee Raise  5 reps 2 sets,  HEP after cues,  hurt initially             PT Education - 02/23/17 0928    Education provided  Yes    Education Details  ADL,  HEP    Person(s) Educated  Patient    Methods  Explanation;Demonstration;Verbal cues;Tactile cues    Comprehension  Verbalized understanding;Returned demonstration          PT Long Term Goals - 02/20/17 1202      PT LONG TERM GOAL #1   Title  independent with HEP    Status  New    Target Date  04/03/17      PT LONG TERM GOAL #2   Title  verbalize understanding of posture/body mechanics to decrease risk of reinjury    Status  New    Target Date  04/03/17      PT LONG TERM GOAL #3   Title  report ability to walk > 15 min without increase in pain for improved function    Status  New    Target Date  04/03/17      PT LONG TERM GOAL #4   Title  improve bil hip strength to at least 4/5 for improved stability and function    Status  New    Target Date  04/03/17            Plan - 02/23/17 1224    Clinical Impression Statement  Patient was able to supine march without pain after abdominal engagement.  She has been using poor abdominal engagement with ADL's at home.Progress toward HEP goal.      PT Next Visit Plan  review piriformis stretch, help consolidate HEP to include flexion biased and core strengthening    PT Home Exercise Plan  Lumbar stab 1 exercises    Consulted and Agree with Plan of Care  Patient       Patient will benefit from skilled therapeutic intervention in order to improve the following deficits and impairments:     Visit Diagnosis: Acute bilateral low back pain with bilateral sciatica  Abnormal posture  Muscle weakness (generalized)     Problem  List Patient Active Problem List   Diagnosis Date Noted  . Lumbar discogenic pain syndrome 02/15/2017  . Radiculopathy, lumbar region 02/15/2017  . Arthritis of facet joints at multiple vertebral levels ([place]) 02/15/2017  . Low back pain 02/15/2017  . Glucose intolerance (impaired glucose tolerance) 09/14/2016  . Obesity, Class I, BMI 30-34.9 08/30/2016  . Health education/counseling 08/30/2016  . Prediabetes 07/30/2015  . Elevated LDL cholesterol level 07/30/2015  . Elevated TSH 07/30/2015  . obese 07/20/2015  . Family history of breast cancer in Mother 07/20/2015  . GERD (gastroesophageal reflux disease) 07/20/2015  . Generalized OA 07/20/2015  . Hyperlipidemia 07/20/2015  . Environmental and seasonal allergies 07/20/2015  . Spinal stenosis: Chronic back pain  07/20/2015  . Encounter for screening mammogram for breast cancer 07/16/2015  . Neoplasm of connective tissue 11/23/2011    Corrie Brannen PTA 02/23/2017, 12:31 PM  [facility] [address] [place], [place], [address] Phone: [phone]   Fax:  [phone]  Name: Paula Conway MRN: [id number] Date of Birth: [birth year]

## 2017-02-23 NOTE — Patient Instructions (Addendum)

## 2017-02-28 ENCOUNTER — Encounter: Payer: Self-pay | Admitting: Physical Therapy

## 2017-02-28 ENCOUNTER — Ambulatory Visit: Payer: BC Managed Care – PPO | Admitting: Physical Therapy

## 2017-02-28 DIAGNOSIS — R293 Abnormal posture: Secondary | ICD-10-CM

## 2017-02-28 DIAGNOSIS — M5441 Lumbago with sciatica, right side: Secondary | ICD-10-CM

## 2017-02-28 DIAGNOSIS — M6281 Muscle weakness (generalized): Secondary | ICD-10-CM

## 2017-02-28 DIAGNOSIS — M5442 Lumbago with sciatica, left side: Secondary | ICD-10-CM | POA: Diagnosis not present

## 2017-02-28 NOTE — Patient Instructions (Addendum)
[employer]    Copyright  VHI. All rights reserved.

## 2017-02-28 NOTE — Therapy (Signed)
[name], [place], [address] Phone: [phone]   Fax:  [phone]  Physical Therapy Treatment  Patient Details  Name: Paula Conway MRN: [id number] Date of Birth: 07-17-1955 Referring Provider: Mellody Dance, DO   Encounter Date: 02/28/2017  PT End of Session - 02/28/17 1316    Visit Number  3    Number of Visits  12    Date for PT Re-Evaluation  04/03/17    PT Start Time  0805    PT Stop Time  0846    PT Time Calculation (min)  41 min    Activity Tolerance  Patient tolerated treatment well    Behavior During Therapy  [facility] for tasks assessed/performed       Past Medical History:  Diagnosis Date  . Allergy   . Arthritis   . Environmental and seasonal allergies 07/20/2015  . Family history of breast cancer in Mother 07/20/2015   Mother was diagnosed at 70 years old and died age 41 of breast cancer   . GERD (gastroesophageal reflux disease)   . Hyperlipidemia   . Obesity 07/20/2015  . Spinal stenosis     Past Surgical History:  Procedure Laterality Date  . APPENDECTOMY    . core biopsy      There were no vitals filed for this visit.  Subjective Assessment - 02/28/17 1307    Subjective  I am having less intense leg pain.  the exercises have helped.  I try to use good posture.  Husband has noticed me standing up straighter with the grocery cart.    Currently in Pain?  Yes    Pain Score  2  up to 7-8/10    Pain Location  Back    Pain Orientation  Right;Left;Lower    Pain Type  Chronic pain;Acute pain    Pain Radiating Towards  sometimes leg    Pain Frequency  Intermittent    Aggravating Factors   doing something wrong    Pain Relieving Factors  stretches on floor    Multiple Pain Sites  No                      [facility] Adult PT Treatment/Exercise - 02/28/17 0001      Lumbar Exercises: Stretches   Piriformis Stretch  3 reps;30 seconds supine toward opposite shoulder     Figure 4 Stretch  3 reps sitting      Lumbar Exercises: Standing   Functional Squats  10 reps with and without pole.  YOGA Chair pose    Functional Squats Limitations  able to do without pain cued initially    Other Standing Lumbar Exercises  Other YOGA poses tried with increased hip pain left gluteal      Lumbar Exercises: Seated   Other Seated Lumbar Exercises  on sit fit: neutral spine,  neutral spine with small marches.     Other Seated Lumbar Exercises  X to V and touch opposit knee with neutral while on sit fit.      Lumbar Exercises: Supine   Ab Set  10 reps with transver abdominus    Clam  5 reps 3 sets single and both,  cued technique, TRA cues    Bridge  10 reps better with 1 bone lift at a time             PT Education - 02/28/17 1316    Education provided  Yes  Education Details  exercise form, neutral spine    Person(s) Educated  Patient    Methods  Explanation;Demonstration;Verbal cues;Handout    Comprehension  Verbalized understanding;Returned demonstration          PT Long Term Goals - 02/20/17 1202      PT LONG TERM GOAL #1   Title  independent with HEP    Status  New    Target Date  04/03/17      PT LONG TERM GOAL #2   Title  verbalize understanding of posture/body mechanics to decrease risk of reinjury    Status  New    Target Date  04/03/17      PT LONG TERM GOAL #3   Title  report ability to walk > 15 min without increase in pain for improved function    Status  New    Target Date  04/03/17      PT LONG TERM GOAL #4   Title  improve bil hip strength to at least 4/5 for improved stability and function    Status  New    Target Date  04/03/17            Plan - 02/28/17 1317    Clinical Impression Statement  Pain improving.  Patient continues to make gains with functional improvements with posture.  Using pole, she was able to demo correct squat technique with no pain increase.  Pain increased with some exercise during session,  however it was gone by the end of session.    PT Next Visit Plan  , help consolidate HEP to include flexion biased and core strengthening.  Consider trying some YOGA poses/ problem solve  painful moves?    PT Home Exercise Plan  Lumbar stab 1 exercises    Consulted and Agree with Plan of Care  Patient       Patient will benefit from skilled therapeutic intervention in order to improve the following deficits and impairments:     Visit Diagnosis: Acute bilateral low back pain with bilateral sciatica  Abnormal posture  Muscle weakness (generalized)     Problem List Patient Active Problem List   Diagnosis Date Noted  . Lumbar discogenic pain syndrome 02/15/2017  . Radiculopathy, lumbar region 02/15/2017  . Arthritis of facet joints at multiple vertebral levels ([place]) 02/15/2017  . Low back pain 02/15/2017  . Glucose intolerance (impaired glucose tolerance) 09/14/2016  . Obesity, Class I, BMI 30-34.9 08/30/2016  . Health education/counseling 08/30/2016  . Prediabetes 07/30/2015  . Elevated LDL cholesterol level 07/30/2015  . Elevated TSH 07/30/2015  . obese 07/20/2015  . Family history of breast cancer in Mother 07/20/2015  . GERD (gastroesophageal reflux disease) 07/20/2015  . Generalized OA 07/20/2015  . Hyperlipidemia 07/20/2015  . Environmental and seasonal allergies 07/20/2015  . Spinal stenosis: Chronic back pain  07/20/2015  . Encounter for screening mammogram for breast cancer 07/16/2015  . Neoplasm of connective tissue 11/23/2011    HARRIS,KAREN  PTA 02/28/2017, 1:23 PM  [facility] [address] [place], [place], [address] Phone: [phone]   Fax:  [phone]  Name: Paula Conway MRN: [id number] Date of Birth: 04-07-1955

## 2017-03-02 ENCOUNTER — Ambulatory Visit: Payer: BC Managed Care – PPO | Admitting: Physical Therapy

## 2017-03-02 ENCOUNTER — Encounter: Payer: Self-pay | Admitting: Physical Therapy

## 2017-03-02 DIAGNOSIS — M5442 Lumbago with sciatica, left side: Secondary | ICD-10-CM | POA: Diagnosis not present

## 2017-03-02 DIAGNOSIS — M6281 Muscle weakness (generalized): Secondary | ICD-10-CM

## 2017-03-02 DIAGNOSIS — M5441 Lumbago with sciatica, right side: Secondary | ICD-10-CM

## 2017-03-02 DIAGNOSIS — R293 Abnormal posture: Secondary | ICD-10-CM

## 2017-03-02 NOTE — Therapy (Signed)
[place], [place], [address] Phone: [phone]   Fax:  [phone]  Physical Therapy Treatment  Patient Details  Name: Paula Conway MRN: [id number] Date of Birth: 06-Dec-1955 Referring Provider: Mellody Dance, DO   Encounter Date: 03/02/2017  PT End of Session - 03/02/17 1148    Visit Number  4    Number of Visits  12    Date for PT Re-Evaluation  04/03/17    Authorization Type  [employer]    PT Start Time  1100    PT Stop Time  1141    PT Time Calculation (min)  41 min    Activity Tolerance  Patient tolerated treatment well    Behavior During Therapy  [facility] for tasks assessed/performed       Past Medical History:  Diagnosis Date  . Allergy   . Arthritis   . Environmental and seasonal allergies 07/20/2015  . Family history of breast cancer in Mother 07/20/2015   Mother was diagnosed at 37 years old and died age 59 of breast cancer   . GERD (gastroesophageal reflux disease)   . Hyperlipidemia   . Obesity 07/20/2015  . Spinal stenosis     Past Surgical History:  Procedure Laterality Date  . APPENDECTOMY    . core biopsy      There were no vitals filed for this visit.  Subjective Assessment - 03/02/17 1103    Subjective  back is doing much better; but still having some discomfort.  had a great day yesterday but had a headache so she thinks that overpowered the back.    Patient Stated Goals  improve pain, be more comfortable    Currently in Pain?  Yes    Pain Score  6     Pain Location  Back    Pain Orientation  Left;Right;Lower    Pain Descriptors / Indicators  Aching;Dull;Sharp;Nagging    Pain Type  Chronic pain;Acute pain    Pain Onset  1 to 4 weeks ago    Pain Frequency  Intermittent                      [facility] Adult PT Treatment/Exercise - 03/02/17 1104      Exercises   Exercises  Lumbar      Lumbar Exercises: Stretches   Single Knee to Chest Stretch  Right;Left;2  reps;30 seconds    Double Knee to Chest Stretch  2 reps;30 seconds    Lower Trunk Rotation  3 reps;30 seconds    Piriformis Stretch  3 reps;30 seconds supine toward opposite shoulder      Lumbar Exercises: Aerobic   Nustep  L5 x 6 min      Lumbar Exercises: Supine   Pelvic Tilt  10 reps;5 seconds    Bridge  10 reps;5 seconds    Bridge Limitations  with blue theraband holding slight resistance at neutral hip      Lumbar Exercises: Sidelying   Hip Abduction  Both;10 reps    Hip Abduction Limitations  pulse at end range x 15 bil      Lumbar Exercises: Quadruped   [place]  10 reps    Straight Leg Raise  10 reps;2 seconds    Opposite Arm/Leg Raise  Right arm/Left leg;Left arm/Right leg;10 reps                  PT Long Term Goals - 02/20/17 6967  PT LONG TERM GOAL #1   Title  independent with HEP    Status  New    Target Date  04/03/17      PT LONG TERM GOAL #2   Title  verbalize understanding of posture/body mechanics to decrease risk of reinjury    Status  New    Target Date  04/03/17      PT LONG TERM GOAL #3   Title  report ability to walk > 15 min without increase in pain for improved function    Status  New    Target Date  04/03/17      PT LONG TERM GOAL #4   Title  improve bil hip strength to at least 4/5 for improved stability and function    Status  New    Target Date  04/03/17            Plan - 03/02/17 1148    Clinical Impression Statement  Pt tolerated session well today reporting decreased pain at end of session (did not rate).  Progressing well towards goals.    PT Treatment/Interventions  ADLs/Self Care Home Management;Moist Heat;Electrical Stimulation;Cryotherapy;Ultrasound;Traction;Gait training;Stair training;Functional mobility training;Therapeutic activities;Therapeutic exercise;Balance training;Neuromuscular re-education;Patient/family education;Manual techniques;Dry needling;Taping    PT Next Visit Plan  flexion biased  and core strengthening exercises.  Consider trying some YOGA poses/ problem solve  painful moves?    Consulted and Agree with Plan of Care  Patient       Patient will benefit from skilled therapeutic intervention in order to improve the following deficits and impairments:  Abnormal gait, Pain, Postural dysfunction, Impaired flexibility, Increased fascial restricitons, Increased muscle spasms, Improper body mechanics, Decreased strength, Decreased mobility, Difficulty walking  Visit Diagnosis: Acute bilateral low back pain with bilateral sciatica  Abnormal posture  Muscle weakness (generalized)     Problem List Patient Active Problem List   Diagnosis Date Noted  . Lumbar discogenic pain syndrome 02/15/2017  . Radiculopathy, lumbar region 02/15/2017  . Arthritis of facet joints at multiple vertebral levels ([place]) 02/15/2017  . Low back pain 02/15/2017  . Glucose intolerance (impaired glucose tolerance) 09/14/2016  . Obesity, Class I, BMI 30-34.9 08/30/2016  . Health education/counseling 08/30/2016  . Prediabetes 07/30/2015  . Elevated LDL cholesterol level 07/30/2015  . Elevated TSH 07/30/2015  . obese 07/20/2015  . Family history of breast cancer in Mother 07/20/2015  . GERD (gastroesophageal reflux disease) 07/20/2015  . Generalized OA 07/20/2015  . Hyperlipidemia 07/20/2015  . Environmental and seasonal allergies 07/20/2015  . Spinal stenosis: Chronic back pain  07/20/2015  . Encounter for screening mammogram for breast cancer 07/16/2015  . Neoplasm of connective tissue 11/23/2011      Laureen Abrahams, PT, DPT 03/02/17 11:50 AM     [facility] [address] [place], [place], [address] Phone: [phone]   Fax:  [phone]  Name: TUANA HOHEISEL MRN: [id number] Date of Birth: February 24, 1955

## 2017-03-06 ENCOUNTER — Encounter: Payer: Self-pay | Admitting: Physical Therapy

## 2017-03-06 ENCOUNTER — Ambulatory Visit: Payer: BC Managed Care – PPO | Admitting: Physical Therapy

## 2017-03-06 DIAGNOSIS — M6281 Muscle weakness (generalized): Secondary | ICD-10-CM

## 2017-03-06 DIAGNOSIS — R293 Abnormal posture: Secondary | ICD-10-CM

## 2017-03-06 DIAGNOSIS — M5442 Lumbago with sciatica, left side: Principal | ICD-10-CM

## 2017-03-06 DIAGNOSIS — M5441 Lumbago with sciatica, right side: Secondary | ICD-10-CM

## 2017-03-06 NOTE — Patient Instructions (Signed)
On side lower hip 10 x Move hip   Right hip toward left shoulder then down and back  Repeat other direction with left hip toward right shoulder.

## 2017-03-06 NOTE — Therapy (Signed)
[place], [place], [address] Phone: [phone]   Fax:  [phone]  Physical Therapy Treatment  Patient Details  Name: TACHE BOBST MRN: [id number] Date of Birth: 07-30-1955 Referring Provider: Mellody Dance, DO   Encounter Date: 03/06/2017  PT End of Session - 03/06/17 1803    Visit Number  5    Number of Visits  12    Date for PT Re-Evaluation  04/03/17    PT Start Time  0730    PT Stop Time  0800    PT Time Calculation (min)  30 min    Activity Tolerance  Patient tolerated treatment well    Behavior During Therapy  [facility] for tasks assessed/performed       Past Medical History:  Diagnosis Date  . Allergy   . Arthritis   . Environmental and seasonal allergies 07/20/2015  . Family history of breast cancer in Mother 07/20/2015   Mother was diagnosed at 50 years old and died age 67 of breast cancer   . GERD (gastroesophageal reflux disease)   . Hyperlipidemia   . Obesity 07/20/2015  . Spinal stenosis     Past Surgical History:  Procedure Laterality Date  . APPENDECTOMY    . core biopsy      There were no vitals filed for this visit.  Subjective Assessment - 03/06/17 0734    Subjective  Pain returned yesterday.  I had a lot of company.  I did a lot .  I tend to get in a frenzy and I feel I need to get things done.  The right did not freez up over the weekend.  It did great.    Currently in Pain?  Yes    Pain Score  3   up to 8/10 yesterday    Pain Location  Back    Pain Orientation  Left;Lower    Pain Descriptors / Indicators  Aching;Dull;Tingling    feels like leg is going to sleep  thigh and calf this morning,  not new.    Aggravating Factors   doing something wrong,  first getting up    Pain Relieving Factors  stretches in floor    Multiple Pain Sites  No                      [facility] Adult PT Treatment/Exercise - 03/06/17 0001      Lumbar Exercises: Sidelying   Other Sidelying Lumbar Exercises  hip depression 5 - 10 x each side on side with hips/knees flexed.     Other Sidelying Lumbar Exercises  PNF2 patterns AA,  PROM,  Light resistance,  bilateral      Manual Therapy   Manual therapy comments  leg distraction left multiple reps.  Pain decreased.             PT Education - 03/06/17 1802    Education provided  Yes    Education Details  HEP  trunk PNF    Person(s) Educated  Patient    Methods  Explanation;Tactile cues;Verbal cues;Handout;Demonstration    Comprehension  Verbalized understanding;Returned demonstration          PT Long Term Goals - 03/06/17 1808      PT LONG TERM GOAL #1   Title  independent with HEP    Baseline  needs to do more frequent to decrease pain    Period  Weeks    Status  On-going  PT LONG TERM GOAL #2   Title  verbalize understanding of posture/body mechanics to decrease risk of reinjury    Baseline  tries to do at home    Period  Weeks    Status  On-going      PT LONG TERM GOAL #3   Title  report ability to walk > 15 min without increase in pain for improved function    Period  Weeks    Status  Unable to assess      PT LONG TERM GOAL #4   Title  improve bil hip strength to at least 4/5 for improved stability and function    Period  Weeks    Status  Unable to assess            Plan - 03/06/17 1804    Clinical Impression Statement  Pain flare over weekend with patient pushing herself beyond limits.  Pain was decreased during  session with trunk PNF ranging from PROM, AAROM, AROM and light resistance.  Less guarding  and pain noted at end of session.     PT Next Visit Plan  flexion biased and core strengthening exercises.  Consider trying some YOGA poses/ Review trunk PNF on sides.  Consider pelvic clock.     PT Home Exercise Plan  Lumbar stab 1 exercises, PNF patterns on side    Consulted and Agree with Plan of Care  Patient       Patient will benefit from skilled therapeutic  intervention in order to improve the following deficits and impairments:     Visit Diagnosis: Acute bilateral low back pain with bilateral sciatica  Abnormal posture  Muscle weakness (generalized)     Problem List Patient Active Problem List   Diagnosis Date Noted  . Lumbar discogenic pain syndrome 02/15/2017  . Radiculopathy, lumbar region 02/15/2017  . Arthritis of facet joints at multiple vertebral levels ([place]) 02/15/2017  . Low back pain 02/15/2017  . Glucose intolerance (impaired glucose tolerance) 09/14/2016  . Obesity, Class I, BMI 30-34.9 08/30/2016  . Health education/counseling 08/30/2016  . Prediabetes 07/30/2015  . Elevated LDL cholesterol level 07/30/2015  . Elevated TSH 07/30/2015  . obese 07/20/2015  . Family history of breast cancer in Mother 07/20/2015  . GERD (gastroesophageal reflux disease) 07/20/2015  . Generalized OA 07/20/2015  . Hyperlipidemia 07/20/2015  . Environmental and seasonal allergies 07/20/2015  . Spinal stenosis: Chronic back pain  07/20/2015  . Encounter for screening mammogram for breast cancer 07/16/2015  . Neoplasm of connective tissue 11/23/2011    HARRIS,KAREN PTA 03/06/2017, 6:10 PM  [facility] [address] [place], [place], [address] Phone: [phone]   Fax:  [phone]  Name: FLORITA NITSCH MRN: [id number] Date of Birth: 1955-11-15

## 2017-03-08 ENCOUNTER — Ambulatory Visit: Payer: BC Managed Care – PPO | Admitting: Physical Therapy

## 2017-03-08 ENCOUNTER — Encounter: Payer: Self-pay | Admitting: Physical Therapy

## 2017-03-08 DIAGNOSIS — M6281 Muscle weakness (generalized): Secondary | ICD-10-CM

## 2017-03-08 DIAGNOSIS — M5442 Lumbago with sciatica, left side: Secondary | ICD-10-CM | POA: Diagnosis not present

## 2017-03-08 DIAGNOSIS — R293 Abnormal posture: Secondary | ICD-10-CM

## 2017-03-08 DIAGNOSIS — M5441 Lumbago with sciatica, right side: Secondary | ICD-10-CM

## 2017-03-08 NOTE — Patient Instructions (Signed)
Issued from ex drawer  JOSPT  Single leg bridge. Daily 10 X  3-4 x a week Pause vs hold.

## 2017-03-08 NOTE — Therapy (Signed)
[place], [place], [address] Phone: [phone]   Fax:  [phone]  Physical Therapy Treatment  Patient Details  Name: Paula Conway MRN: [id number] Date of Birth: [birth year] Referring Provider: Mellody Dance, DO   Encounter Date: 03/08/2017  PT End of Session - 03/08/17 1205    Visit Number  6    Number of Visits  12    Date for PT Re-Evaluation  04/03/17    PT Start Time  0800    PT Stop Time  0846    PT Time Calculation (min)  46 min    Activity Tolerance  Patient tolerated treatment well    Behavior During Therapy  [facility] for tasks assessed/performed       Past Medical History:  Diagnosis Date  . Allergy   . Arthritis   . Environmental and seasonal allergies 07/20/2015  . Family history of breast cancer in Mother 07/20/2015   Mother was diagnosed at 4 years old and died age 30 of breast cancer   . GERD (gastroesophageal reflux disease)   . Hyperlipidemia   . Obesity 07/20/2015  . Spinal stenosis     Past Surgical History:  Procedure Laterality Date  . APPENDECTOMY    . core biopsy      There were no vitals filed for this visit.  Subjective Assessment - 03/08/17 0821    Subjective  No pain today.  My daughter wants to know when I will be able to babysit.    Currently in Pain?  No/denies    Pain Location  Back    Pain Orientation  Lower                      [facility] Adult PT Treatment/Exercise - 03/08/17 0001      Lumbar Exercises: Standing   Functional Squats Limitations  mini squat with mod cues and side steps cued for abdominal engagement.    Other Standing Lumbar Exercises  YOGA poses,  cued for technique , needs disassociation with hip from pelvis at times. to decrease pain.    Other Standing Lumbar Exercises  Hip hinge with and without pole cued for technique      Lumbar Exercises: Supine   Bridge  10 reps    Single Leg Bridge  5 reps HEP      Lumbar  Exercises: Sidelying   Other Sidelying Lumbar Exercises  depression and PNF patterns 10 X each AA, AROM and RROM D1 and 2.  on right side.  moderate cues,   no pain             PT Education - 03/08/17 1205    Education provided  Yes    Education Details  HEP    Person(s) Educated  Patient    Methods  Explanation;Demonstration;Verbal cues;Handout    Comprehension  Verbalized understanding;Returned demonstration          PT Long Term Goals - 03/06/17 1808      PT LONG TERM GOAL #1   Title  independent with HEP    Baseline  needs to do more frequent to decrease pain    Period  Weeks    Status  On-going      PT LONG TERM GOAL #2   Title  verbalize understanding of posture/body mechanics to decrease risk of reinjury    Baseline  tries to do at home    Period  Weeks    Status  On-going      PT LONG TERM GOAL #3   Title  report ability to walk > 15 min without increase in pain for improved function    Period  Weeks    Status  Unable to assess      PT LONG TERM GOAL #4   Title  improve bil hip strength to at least 4/5 for improved stability and function    Period  Weeks    Status  Unable to assess            Plan - 03/08/17 1206    Clinical Impression Statement  No pain at end of session.  Progress toward HEP and body mechanics goal with patient using good posture to look under the sink and lift leg with getting into washing machine, She is ready to practice lifting.      PT Next Visit Plan  flexion biased and core strengthening exercises.  Consider lifting practice,  YOGA poses,  hip strengthening    PT Home Exercise Plan  Lumbar stab 1 exercises, PNF patterns on side,  single leg bridge    Consulted and Agree with Plan of Care  Patient       Patient will benefit from skilled therapeutic intervention in order to improve the following deficits and impairments:     Visit Diagnosis: Acute bilateral low back pain with bilateral sciatica  Abnormal  posture  Muscle weakness (generalized)     Problem List Patient Active Problem List   Diagnosis Date Noted  . Lumbar discogenic pain syndrome 02/15/2017  . Radiculopathy, lumbar region 02/15/2017  . Arthritis of facet joints at multiple vertebral levels ([place]) 02/15/2017  . Low back pain 02/15/2017  . Glucose intolerance (impaired glucose tolerance) 09/14/2016  . Obesity, Class I, BMI 30-34.9 08/30/2016  . Health education/counseling 08/30/2016  . Prediabetes 07/30/2015  . Elevated LDL cholesterol level 07/30/2015  . Elevated TSH 07/30/2015  . obese 07/20/2015  . Family history of breast cancer in Mother 07/20/2015  . GERD (gastroesophageal reflux disease) 07/20/2015  . Generalized OA 07/20/2015  . Hyperlipidemia 07/20/2015  . Environmental and seasonal allergies 07/20/2015  . Spinal stenosis: Chronic back pain  07/20/2015  . Encounter for screening mammogram for breast cancer 07/16/2015  . Neoplasm of connective tissue 11/23/2011    HARRIS,KAREN PTA 03/08/2017, 12:12 PM  [facility] [address] [place], [place], [address] Phone: [phone]   Fax:  [phone]  Name: Paula Conway MRN: [id number] Date of Birth: 04/01/1955

## 2017-03-13 ENCOUNTER — Encounter: Payer: Self-pay | Admitting: Physical Therapy

## 2017-03-13 ENCOUNTER — Ambulatory Visit: Payer: BC Managed Care – PPO | Attending: Family Medicine | Admitting: Physical Therapy

## 2017-03-13 DIAGNOSIS — R293 Abnormal posture: Secondary | ICD-10-CM

## 2017-03-13 DIAGNOSIS — M5442 Lumbago with sciatica, left side: Secondary | ICD-10-CM | POA: Insufficient documentation

## 2017-03-13 DIAGNOSIS — M6281 Muscle weakness (generalized): Secondary | ICD-10-CM | POA: Diagnosis present

## 2017-03-13 DIAGNOSIS — M5441 Lumbago with sciatica, right side: Secondary | ICD-10-CM

## 2017-03-13 NOTE — Patient Instructions (Signed)
Issued from exercise drawer: Pelvic stabilization,mobility and coordination to be done PRN 1 minute or more each

## 2017-03-13 NOTE — Therapy (Signed)
[place], [place], [address] Phone: [phone]   Fax:  [phone]  Physical Therapy Treatment  Patient Details  Name: Paula Conway MRN: [id number] Date of Birth: April 15, 1955 Referring Provider: Mellody Dance, DO   Encounter Date: 03/13/2017  PT End of Session - 03/13/17 0935    Visit Number  7    Number of Visits  12    Date for PT Re-Evaluation  04/03/17    PT Start Time  0847    PT Stop Time  0927    PT Time Calculation (min)  40 min    Activity Tolerance  Patient tolerated treatment well    Behavior During Therapy  [facility] for tasks assessed/performed       Past Medical History:  Diagnosis Date  . Allergy   . Arthritis   . Environmental and seasonal allergies 07/20/2015  . Family history of breast cancer in Mother 07/20/2015   Mother was diagnosed at 71 years old and died age 74 of breast cancer   . GERD (gastroesophageal reflux disease)   . Hyperlipidemia   . Obesity 07/20/2015  . Spinal stenosis     Past Surgical History:  Procedure Laterality Date  . APPENDECTOMY    . core biopsy      There were no vitals filed for this visit.  Subjective Assessment - 03/13/17 0852    Subjective  PT is helping with the pain.   MD appointment  WED.    Currently in Pain?  No/denies    Pain Score  6  up to 5-6/10    Pain Location  Back    Pain Orientation  Lower    Pain Descriptors / Indicators  Tingling;Numbness    Pain Type  Chronic pain    Pain Radiating Towards  leg daily  also 1st thing in the am,  sometimes leg /  calf    Pain Frequency  Intermittent    Aggravating Factors   first getting up,  walking,     Pain Relieving Factors  stretches on floor    Multiple Pain Sites  No                      [facility] Adult PT Treatment/Exercise - 03/13/17 0001      Therapeutic Activites    Therapeutic Activities  Lifting    Lifting  practiced lifting, carrying bot to different heights,   problem solving  with practice for child care of grandbaby who weighs 18 LBS and is not quite walking.       Lumbar Exercises: Stretches   Double Knee to Chest Stretch  -- 10 x legs on ball    Lower Trunk Rotation  -- 10 x with legs on ball      Lumbar Exercises: Supine   Bridge  10 reps legs on ball    Other Supine Lumbar Exercises  Sitting ball ex for pelvic coordination, mobility and stabilization.               PT Education - 03/13/17 0935    Education provided  Yes    Education Details  HEP anf lifting    Person(s) Educated  Patient    Methods  Explanation;Demonstration;Tactile cues;Verbal cues;Handout    Comprehension  Verbalized understanding;Returned demonstration          PT Long Term Goals - 03/13/17 5056      PT LONG TERM GOAL #1   Title  independent with HEP    Baseline  independent so far doing consistantly    Period  Weeks    Status  On-going      PT LONG TERM GOAL #2   Title  verbalize understanding of posture/body mechanics to decrease risk of reinjury    Baseline  does at home able to do in clinic    Period  Weeks    Status  Partially Met      PT LONG TERM GOAL #3   Title  report ability to walk > 15 min without increase in pain for improved function    Baseline  not consistant.  may be overcorrecting    Period  Weeks    Status  On-going      PT LONG TERM GOAL #4   Title  improve bil hip strength to at least 4/5 for improved stability and function    Period  Weeks    Status  Unable to assess            Plan - 03/13/17 0936    Clinical Impression Statement  Patient able to demo correct technique lifting 18 LBS without increased pain after practice and education.   No pain today.  STG#2 partially met. She continues to have back pain wit walking and she may be overcorrecting,  She has been having less pain with YOGA.    PT Next Visit Plan  flexion biased and core strengthening exercises review.  She has 1 more visit scheduled.  FOTO    PT  Home Exercise Plan  Lumbar stab 1 exercises, PNF patterns on side,  single leg bridge,  Ball exercises sitting    Consulted and Agree with Plan of Care  Patient       Patient will benefit from skilled therapeutic intervention in order to improve the following deficits and impairments:     Visit Diagnosis: Acute bilateral low back pain with bilateral sciatica  Abnormal posture  Muscle weakness (generalized)     Problem List Patient Active Problem List   Diagnosis Date Noted  . Lumbar discogenic pain syndrome 02/15/2017  . Radiculopathy, lumbar region 02/15/2017  . Arthritis of facet joints at multiple vertebral levels ([place]) 02/15/2017  . Low back pain 02/15/2017  . Glucose intolerance (impaired glucose tolerance) 09/14/2016  . Obesity, Class I, BMI 30-34.9 08/30/2016  . Health education/counseling 08/30/2016  . Prediabetes 07/30/2015  . Elevated LDL cholesterol level 07/30/2015  . Elevated TSH 07/30/2015  . obese 07/20/2015  . Family history of breast cancer in Mother 07/20/2015  . GERD (gastroesophageal reflux disease) 07/20/2015  . Generalized OA 07/20/2015  . Hyperlipidemia 07/20/2015  . Environmental and seasonal allergies 07/20/2015  . Spinal stenosis: Chronic back pain  07/20/2015  . Encounter for screening mammogram for breast cancer 07/16/2015  . Neoplasm of connective tissue 11/23/2011    HARRIS,KAREN PTA 03/13/2017, 9:41 AM  [facility] [address] [place], [place], [address] Phone: [phone]   Fax:  [phone]  Name: Paula Conway MRN: [id number] Date of Birth: [birth year]

## 2017-03-15 ENCOUNTER — Encounter: Payer: Self-pay | Admitting: Family Medicine

## 2017-03-15 ENCOUNTER — Ambulatory Visit: Payer: BC Managed Care – PPO | Admitting: Family Medicine

## 2017-03-15 VITALS — BP 123/83 | HR 72 | Ht 64.25 in | Wt 203.0 lb

## 2017-03-15 DIAGNOSIS — M5416 Radiculopathy, lumbar region: Secondary | ICD-10-CM | POA: Diagnosis not present

## 2017-03-15 DIAGNOSIS — E559 Vitamin D deficiency, unspecified: Secondary | ICD-10-CM

## 2017-03-15 DIAGNOSIS — R7302 Impaired glucose tolerance (oral): Secondary | ICD-10-CM

## 2017-03-15 DIAGNOSIS — E669 Obesity, unspecified: Secondary | ICD-10-CM | POA: Diagnosis not present

## 2017-03-15 DIAGNOSIS — E785 Hyperlipidemia, unspecified: Secondary | ICD-10-CM | POA: Diagnosis not present

## 2017-03-15 DIAGNOSIS — M4800 Spinal stenosis, site unspecified: Secondary | ICD-10-CM

## 2017-03-15 LAB — POCT GLYCOSYLATED HEMOGLOBIN (HGB A1C): HEMOGLOBIN A1C: 6.2

## 2017-03-15 NOTE — Progress Notes (Signed)
Impression and Recommendations:    1. Glucose intolerance (impaired glucose tolerance)- A1c 6.2 03/15/17   2. Hyperlipidemia, unspecified hyperlipidemia type   3. Obesity, Class I, BMI 30-34.9   4. Vitamin D insufficiency   5. Spinal stenosis, unspecified spinal region   6. Radiculopathy, lumbar region     1. Prediabetes A1c = 6.2 today A1c = 5.8 on 08/31/2016  - Reviewed that 5.7 to 6.4 is prediabetes range.  08/30/2016 = 188 lbs September 2018 = 192 lbs Today = 203 lbs  - Discussed that weight gain of 15 lbs is easily a contributing factor to increased glucose intolerance. - Increased BMI = increased insulin resistance.  - Reviewed that if she becomes a diabetic, she will need to begin on several medications.  - Re-check A1c in 4 months. - Continue to monitor closely. - Prediabetes handout provided.  2. Physical Therapy - Back - Patient to continue physical therapy. - Symptoms stable and improving with that treatment.  - Patient will ask about HEP.  - Advised the patient to ask to make sure she understands her home exercise program (HEP), with handouts and activities she needs to do to remain injury free and avoid deterioration of her back after therapy ends.  Lumbar Ridiculopathy - Symptoms stable on Lyrica; is helping.  Will continue meds   3. Health Maintenance - Advised exercise, healthy diet, and any activity where she creates new pathways in her brain - learning a new instrument, learning a new language - these are good for memory and brain health.  Anything new or thought-provoking increases brain health.  - Encouraged the patient to resume her calorie tracking app, and being counting her calories again.  Advised that the best way to lose weight is to calculate caloric intake on a regular basis, and aim to born more than we take in.  - Emphasized that the patient should track absolutely everything she consumes.  - Discussed caloric density with the  patient - that a small spoonful of honey, or granola, is much more calorie dense than a cup of something like blueberries.  - Advised patient to continue working toward exercising to improve health.    - PT will begin with 15 minutes of activity daily.  Recommended that the patient eventually strive for at least 150 minutes of cardio per week according to guidelines established by the [facility].   - Healthy dietary habits encouraged, including low-carb, and high amounts of lean protein in diet.   - Patient should also consume adequate amounts of water - half of body weight in oz of water per day  Explained to patient what BMI refers to, and what it means medically.    Told patient to think about it as a "medical risk stratification measurement" and how increasing BMI is associated with increasing risk/ or worsening state of various diseases such as hypertension, hyperlipidemia, diabetes, premature OA, depression etc.  American Heart Association guidelines for healthy diet, basically Mediterranean diet, and exercise guidelines of 30 minutes 5 days per week or more discussed in detail.  Health counseling performed.  All questions answered.  4. Follow-Up - Follow up in 1-4 months. - 1 month recommended to make sure she is on track. - Re-check A1c in 3-4 months.  - Patient notes that she did feel better when she was 15 lbs lighter, and will follow up for activity and weight loss in a month.   Orders Placed This Encounter  Procedures  . POCT  glycosylated hemoglobin (Hb A1C)    No orders of the defined types were placed in this encounter.   Gross side effects, risk and benefits, and alternatives of medications and treatment plan in general discussed with patient.  Patient is aware that all medications have potential side effects and we are unable to predict every side effect or drug-drug interaction that may occur.   Patient will call with any questions prior to using medication if they have  concerns.  Expresses verbal understanding and consents to current therapy and treatment regimen.  No barriers to understanding were identified.  Red flag symptoms and signs discussed in detail.  Patient expressed understanding regarding what to do in case of emergency\urgent symptoms  Please see AVS handed out to patient at the end of our visit for further patient instructions/ counseling done pertaining to today's office visit.   Return for 1 mo to go over MFP food log &/or 3-17mo for A1c reck.    Note: This note was prepared with assistance of Dragon voice recognition software. Occasional wrong-word or sound-a-like substitutions may have occurred due to the inherent limitations of voice recognition software.   This document serves as a record of services personally performed by Mellody Dance, DO. It was created on her behalf by Toni Amend, a trained medical scribe. The creation of this record is based on the scribe's personal observations and the provider's statements to them.   I have reviewed the above medical documentation for accuracy and completeness and I concur.  Mellody Dance 03/15/17 2:05 PM   -------------------------------------------------------------------------------------------------------------------------------------------------------------------------   Subjective:     HPI: Paula Conway is a 62 y.o. female who presents to [place] [facility] today for issues as discussed below.  1. Prediabetes A1c = 6.2 today A1c = 5.8 on 08/31/2016  08/30/2016 = 188 lbs September 2018 = 192 lbs Today = 203 lbs.  Patient notes "I know I failed."  Patient has not been working on exercise or diet. Says "I've got to go back to the app," referring to [employer].  With regards to her eating habits, she notes "I'm trying to eat three meals per day instead of just one." Describes a meal of homemade granola, [place] yogurt, blueberries, and a  drizzle of honey that she prepared this morning.  Says "I am trying to be good."  2. Physical Therapy Still does physical therapy for her back, and attends yoga once a week.  Her physical therapy is going well.  She prefers the therapist that is her age. Notes that she had a big knot under her ribs, which was alleviated by the exercises she gave her.  Continues taking Lyrica.   Wt Readings from Last 3 Encounters:  03/15/17 203 lb (92.1 kg)  02/15/17 198 lb 11.2 oz (90.1 kg)  09/14/16 192 lb 4.8 oz (87.2 kg)   BP Readings from Last 3 Encounters:  03/15/17 123/83  02/15/17 136/78  09/14/16 128/84   Pulse Readings from Last 3 Encounters:  03/15/17 72  02/15/17 76  09/14/16 68   BMI Readings from Last 3 Encounters:  03/15/17 34.57 kg/m  02/15/17 33.84 kg/m  09/14/16 32.75 kg/m     Patient Care Team    Relationship Specialty Notifications Start End  Mellody Dance, DO PCP - General Family Medicine  07/16/15      Patient Active Problem List   Diagnosis Date Noted  . Obesity, Class I, BMI 30-34.9 08/30/2016    Priority: High  . Prediabetes  07/30/2015    Priority: High  . Elevated LDL cholesterol level 07/30/2015    Priority: High  . Hyperlipidemia 07/20/2015    Priority: High  . Family history of breast cancer in Mother 07/20/2015    Priority: Medium  . GERD (gastroesophageal reflux disease) 07/20/2015    Priority: Medium  . Generalized OA 07/20/2015    Priority: Low  . Environmental and seasonal allergies 07/20/2015    Priority: Low  . Spinal stenosis: Chronic back pain  07/20/2015    Priority: Low  . Vitamin D insufficiency 03/15/2017  . Lumbar discogenic pain syndrome 02/15/2017  . Radiculopathy, lumbar region 02/15/2017  . Arthritis of facet joints at multiple vertebral levels ([place]) 02/15/2017  . Low back pain 02/15/2017  . Glucose intolerance (impaired glucose tolerance) 09/14/2016  . Health education/counseling 08/30/2016  . Elevated TSH 07/30/2015   . Encounter for screening mammogram for breast cancer 07/16/2015  . Neoplasm of connective tissue 11/23/2011    Past Medical history, Surgical history, Family history, Social history, Allergies and Medications have been entered into the medical record, reviewed and changed as needed.    Current Meds  Medication Sig  . Ascorbic Acid (VITAMIN C) 1000 MG tablet Take 1,000 mg by mouth daily.  . Cholecalciferol (VITAMIN D3) 5000 units CAPS Take 5,000 Units by mouth daily.  . Glucos-Chond-Hyal Ac-Ca Fructo (MOVE FREE JOINT HEALTH ADVANCE PO) Take 1 tablet by mouth daily.  . Magnesium 250 MG TABS Take 1 tablet by mouth daily.  . pregabalin (LYRICA) 75 MG capsule Take 1 capsule (75 mg total) by mouth 2 (two) times daily.  . Probiotic Product (PROBIOTIC PO) Take 1 tablet by mouth daily.  . vitamin B-12 (CYANOCOBALAMIN) 250 MCG tablet Take 250 mcg by mouth daily.  . Vitamin D, Ergocalciferol, (DRISDOL) 50000 units CAPS capsule Take 1 capsule (50,000 Units total) by mouth every 7 (seven) days.    Allergies:  No Known Allergies   Review of Systems:  A fourteen system review of systems was performed and found to be positive as per HPI.   Objective:   Blood pressure 123/83, pulse 72, height 5' 4.25" (1.632 m), weight 203 lb (92.1 kg), SpO2 98 %. Body mass index is 34.57 kg/m. General:  Well Developed, well nourished, appropriate for stated age.  Neuro:  Alert and oriented,  extra-ocular muscles intact  HEENT:  Normocephalic, atraumatic, neck supple, no carotid bruits appreciated  Skin:  no gross rash, warm, pink. Cardiac:  RRR, S1 S2 Respiratory:  ECTA B/L and A/P, Not using accessory muscles, speaking in full sentences- unlabored. Vascular:  Ext warm, no cyanosis apprec.; cap RF less 2 sec. Psych:  No HI/SI, judgement and insight good, Euthymic mood. Full Affect. LE: 1+ bilateral non-pitting edema.

## 2017-03-15 NOTE — Patient Instructions (Signed)
Ask your physical therapist about a home exercise program when you go tomorrow.  Want to make sure you know what exercises need to be continued to be done to avoid deterioration of your back condition.  -Please come in in 1-4 months.  One month if you would like to go over your my fitness pal app and see how you are doing hitting your calories per day etc. otherwise 3-4 months so we can recheck your A1c which was 6.2 today.  Risk factors for prediabetes and type 2 diabetes  Researchers don't fully understand why some people develop prediabetes and type 2 diabetes and others don't.  It's clear that certain factors increase the risk, however, including:  Weight. The more fatty tissue you have, the more resistant your cells become to insulin.  Inactivity. The less active you are, the greater your risk. Physical activity helps you control your weight, uses up glucose as energy and makes your cells more sensitive to insulin.  Family history. Your risk increases if a parent or sibling has type 2 diabetes.  Race. Although it's unclear why, people of certain races - including blacks, Hispanics, American Indians and Asian-Americans - are at higher risk.  Age. Your risk increases as you get older. This may be because you tend to exercise less, lose muscle mass and gain weight as you age. But type 2 diabetes is also increasing dramatically among children, adolescents and younger adults.  Gestational diabetes. If you developed gestational diabetes when you were pregnant, your risk of developing prediabetes and type 2 diabetes later increases. If you gave birth to a baby weighing more than 9 pounds (4 kilograms), you're also at risk of type 2 diabetes.  Polycystic ovary syndrome. For women, having polycystic ovary syndrome - a common condition characterized by irregular menstrual periods, excess hair growth and obesity - increases the risk of diabetes.  High blood pressure. Having blood pressure over 140/90  millimeters of mercury (mm Hg) is linked to an increased risk of type 2 diabetes.  Abnormal cholesterol and triglyceride levels. If you have low levels of high-density lipoprotein (HDL), or "good," cholesterol, your risk of type 2 diabetes is higher. Triglycerides are another type of fat carried in the blood. People with high levels of triglycerides have an increased risk of type 2 diabetes. Your doctor can let you know what your cholesterol and triglyceride levels are.  A good guide to good carbs: The glycemic index ---If you have diabetes, or at risk for diabetes, you know all too well that when you eat carbohydrates, your blood sugar goes up. The total amount of carbs you consume at a meal or in a snack mostly determines what your blood sugar will do. But the food itself also plays a role. A serving of white rice has almost the same effect as eating pure table sugar - a quick, high spike in blood sugar. A serving of lentils has a slower, smaller effect.  ---Picking good sources of carbs can help you control your blood sugar and your weight. Even if you don't have diabetes, eating healthier carbohydrate-rich foods can help ward off a host of chronic conditions, from heart disease to various cancers to, well, diabetes.  ---One way to choose foods is with the glycemic index (GI). This tool measures how much a food boosts blood sugar.  The glycemic index rates the effect of a specific amount of a food on blood sugar compared with the same amount of pure glucose. A food with a  glycemic index of 28 boosts blood sugar only 28% as much as pure glucose. One with a GI of 95 acts like pure glucose.    High glycemic foods result in a quick spike in insulin and blood sugar (also known as blood glucose).  Low glycemic foods have a slower, smaller effect- these are healthier for you.   Using the glycemic index Using the glycemic index is easy: choose foods in the low GI category instead of those in the high GI  category (see below), and go easy on those in between. Low glycemic index (GI of 55 or less): Most fruits and vegetables, beans, minimally processed grains, pasta, low-fat dairy foods, and nuts.  Moderate glycemic index (GI 56 to 69): White and sweet potatoes, corn, white rice, couscous, breakfast cereals such as Cream of Wheat and Mini Wheats.  High glycemic index (GI of 70 or higher): White bread, rice cakes, most crackers, bagels, cakes, doughnuts, croissants, most packaged breakfast cereals. You can see the values for 100 commons foods and get links to more at www.health.CheapToothpicks.si.  Swaps for lowering glycemic index  Instead of this high-glycemic index food Eat this lower-glycemic index food  White rice Brown rice or converted rice  Instant oatmeal Steel-cut oats  Cornflakes Bran flakes  Baked potato Pasta, bulgur  White bread Whole-grain bread  Corn Peas or leafy greens       Prediabetes Eating Plan  Prediabetes--also called impaired glucose tolerance or impaired fasting glucose--is a condition that causes blood sugar (blood glucose) levels to be higher than normal. Following a healthy diet can help to keep prediabetes under control. It can also help to lower the risk of type 2 diabetes and heart disease, which are increased in people who have prediabetes. Along with regular exercise, a healthy diet:  Promotes weight loss.  Helps to control blood sugar levels.  Helps to improve the way that the body uses insulin.   WHAT DO I NEED TO KNOW ABOUT THIS EATING PLAN?   Use the glycemic index (GI) to plan your meals. The index tells you how quickly a food will raise your blood sugar. Choose low-GI foods. These foods take a longer time to raise blood sugar.  Pay close attention to the amount of carbohydrates in the food that you eat. Carbohydrates increase blood sugar levels.  Keep track of how many calories you take in. Eating the right amount of calories will help you  to achieve a healthy weight. Losing about 7 percent of your starting weight can help to prevent type 2 diabetes.  You may want to follow a Mediterranean diet. This diet includes a lot of vegetables, lean meats or fish, whole grains, fruits, and healthy oils and fats.   WHAT FOODS CAN I EAT?  Grains Whole grains, such as whole-wheat or whole-grain breads, crackers, cereals, and pasta. Unsweetened oatmeal. Bulgur. Barley. Quinoa. Brown rice. Corn or whole-wheat flour tortillas or taco shells. Vegetables Lettuce. Spinach. Peas. Beets. Cauliflower. Cabbage. Broccoli. Carrots. Tomatoes. Squash. Eggplant. Herbs. Peppers. Onions. Cucumbers. Brussels sprouts. Fruits Berries. Bananas. Apples. Oranges. Grapes. Papaya. Mango. Pomegranate. Kiwi. Grapefruit. Cherries. Meats and Other Protein Sources Seafood. Lean meats, such as chicken and [place] or lean cuts of pork and beef. Tofu. Eggs. Nuts. Beans. Dairy Low-fat or fat-free dairy products, such as yogurt, cottage cheese, and cheese. Beverages Water. Tea. Coffee. Sugar-free or diet soda. Seltzer water. Milk. Milk alternatives, such as soy or almond milk. Condiments Mustard. Relish. Low-fat, low-sugar ketchup. Low-fat, low-sugar barbecue sauce. Low-fat  or fat-free mayonnaise. Sweets and Desserts Sugar-free or low-fat pudding. Sugar-free or low-fat ice cream and other frozen treats. Fats and Oils Avocado. Walnuts. Olive oil. The items listed above may not be a complete list of recommended foods or beverages. Contact your dietitian for more options.    WHAT FOODS ARE NOT RECOMMENDED?  Grains Refined white flour and flour products, such as bread, pasta, snack foods, and cereals. Beverages Sweetened drinks, such as sweet iced tea and soda. Sweets and Desserts Baked goods, such as cake, cupcakes, pastries, cookies, and cheesecake. The items listed above may not be a complete list of foods and beverages to avoid. Contact your dietitian for more  information.   This information is not intended to replace advice given to you by your health care provider. Make sure you discuss any questions you have with your health care provider.   Document Released: 05/13/2014 Document Reviewed: 05/13/2014 Elsevier Interactive Patient Education 2016 [place] [employer]  Weight management involves adopting a healthy lifestyle that includes a knowledge of nutrition and exercise, a positive attitude and the right kind of motivation. Internal motives such as better health, increased energy, self-esteem and personal control increase your chances of lifelong weight management success.  Remember to have realistic goals and think long-term success. Believe in yourself and you can do it. The following information will give you ideas to help you meet your goals.  Control Your Home Environment  Eat only while sitting down at the kitchen or dining room table. Do not eat while watching television, reading, cooking, talking on the phone, standing at the refrigerator or working on the computer. Keep tempting foods out of the house - don't buy them. Keep tempting foods out of sight. Have low-calorie foods ready to eat. Unless you are preparing a meal, stay out of the kitchen. Have healthy snacks at your disposal, such as small pieces of fruit, vegetables, canned fruit, pretzels, low-fat string cheese and nonfat cottage cheese.  Control Your Work Environment  Do not eat at [employer] or keep tempting snacks at your desk. If you get hungry between meals, plan healthy snacks and bring them with you to work. During your breaks, go for a walk instead of eating. If you work around food, plan in advance the one item you will eat at mealtime. Make it inconvenient to nibble on food by chewing gum, sugarless candy or drinking water or another low-calorie beverage. Do not work through meals. Skipping meals slows down metabolism  and may result in overeating at the next meal. If food is available for special occasions, either pick the healthiest item, nibble on low-fat snacks brought from home, don't have anything offered, choose one option and have a small amount, or have only a beverage.  Control Your Mealtime Environment  Serve your plate of food at the stove or kitchen counter. Do not put the serving dishes on the table. If you do put dishes on the table, remove them immediately when finished eating. Fill half of your plate with vegetables, a quarter with lean protein and a quarter with starch. Use smaller plates, bowls and glasses. A smaller portion will look large when it is in a little dish. Politely refuse second helpings. When fixing your plate, limit portions of food to one scoop/serving or less.   Daily Food Management  Replace eating with another activity that you will not associate with food. Wait 20 minutes before eating something you are craving. Drink a  large glass of water or diet soda before eating. Always have a big glass or bottle of water to drink throughout the day. Avoid high-calorie add-ons such as cream with your coffee, butter, mayonnaise and salad dressings.  Shopping: Do not shop when hungry or tired. Shop from a list and avoid buying anything that is not on your list. If you must have tempting foods, buy individual-sized packages and try to find a lower-calorie alternative. Don't taste test in the store. Read food labels. Compare products to help you make the healthiest choices.  Preparation: Chew a piece of gum while cooking meals. Use a quarter teaspoon if you taste test your food. Try to only fix what you are going to eat, leaving yourself no chance for seconds. If you have prepared more food than you need, portion it into individual containers and freeze or refrigerate immediately. Don't snack while cooking meals.  Eating: Eat slowly. Remember it takes about 20 minutes for  your stomach to send a message to your brain that it is full. Don't let fake hunger make you think you need more. The ideal way to eat is to take a bite, put your utensil down, take a sip of water, cut your next bite, take a bit, put your utensil down and so on. Do not cut your food all at one time. Cut only as needed. Take small bites and chew your food well. Stop eating for a minute or two at least once during a meal or snack. Take breaks to reflect and have conversation.  Cleanup and Leftovers: Label leftovers for a specific meal or snack. Freeze or refrigerate individual portions of leftovers. Do not clean up if you are still hungry.  Eating Out and Social Eating  Do not arrive hungry. Eat something light before the meal. Try to fill up on low-calorie foods, such as vegetables and fruit, and eat smaller portions of the high-calorie foods. Eat foods that you like, but choose small portions. If you want seconds, wait at least 20 minutes after you have eaten to see if you are actually hungry or if your eyes are bigger than your stomach. Limit alcoholic beverages. Try a soda water with a twist of lime. Do not skip other meals in the day to save room for the special event.  At Restaurants: Order  la carte rather than buffet style. Order some vegetables or a salad for an appetizer instead of eating bread. If you order a high-calorie dish, share it with someone. Try an after-dinner mint with your coffee. If you do have dessert, share it with two or more people. Don't overeat because you do not want to waste food. Ask for a doggie bag to take extra food home. Tell the server to put half of your entree in a to go bag before the meal is served to you. Ask for salad dressing, gravy or high-fat sauces on the side. Dip the tip of your fork in the dressing before each bite. If bread is served, ask for only one piece. Try it plain without butter or oil. At [employer] where oil and vinegar  is served with bread, use only a small amount of oil and a lot of vinegar for dipping.  At a [facility]: Offer to bring a dish, appetizer or dessert that is low in calories. Serve yourself small portions or tell the host that you only want a small amount. Stand or sit away from the snack table. Stay away from the kitchen  or stay busy if you are near the food. Limit your alcohol intake.  At [employer] and Cafeterias: Cover most of your plate with lettuce and/or vegetables. Use a salad plate instead of a dinner plate. After eating, clear away your dishes before having coffee or tea.  Entertaining at Home: Explore low-fat, low-cholesterol cookbooks. Use single-serving foods like chicken breasts or hamburger patties. Prepare low-calorie appetizers and desserts.   Holidays: Keep tempting foods out of sight. Decorate the house without using food. Have low-calorie beverages and foods on hand for guests. Allow yourself one planned treat a day. Don't skip meals to save up for the holiday feast. Eat regular, planned meals.   Exercise Well  Make exercise a priority and a planned activity in the day. If possible, walk the entire or part of the distance to work. Get an exercise buddy. Go for a walk with a colleague during one of your breaks, go to the gym, run or take a walk with a friend, walk in the mall with a shopping companion. Park at the end of the parking lot and walk to the store or office entrance. Always take the stairs all of the way or at least part of the way to your floor. If you have a desk job, walk around the office frequently. Do leg lifts while sitting at your desk. Do something outside on the weekends like going for a hike or a bike ride.   Have a Healthy Attitude  Make health your weight management priority. Be realistic. Have a goal to achieve a healthier you, not necessarily the lowest weight or ideal weight based on calculations or tables. Focus on a healthy  eating style, not on dieting. Dieting usually lasts for a short amount of time and rarely produces long-term success. Think long term. You are developing new healthy behaviors to follow next month, in a year and in a decade.    This information is for educational purposes only and is not intended to replace the advice of your doctor or health care provider. We encourage you to discuss with your doctor any questions or concerns you may have.        Guidelines for Losing Weight   We want weight loss that will last so you should lose 1-2 pounds a week.  THAT IS IT! Please pick THREE things a month to change. Once it is a habit check off the item. Then pick another three items off the list to become habits.  If you are already doing a habit on the list GREAT!  Cross that item off!  Don't drink your calories. Ie, alcohol, soda, fruit juice, and sweet tea.   Drink more water. Drink a glass when you feel hungry or before each meal.   Eat breakfast - Complex carb and protein (likeDannon light and fit yogurt, oatmeal, fruit, eggs, [place] bacon).  Measure your cereal.  Eat no more than one cup a day. (ie Kashi)  Eat an apple a day.  Add a vegetable a day.  Try a new vegetable a month.  Use Pam! Stop using oil or butter to cook.  Don't finish your plate or use smaller plates.  Share your dessert.  Eat sugar free Jello for dessert or frozen grapes.  Don't eat 2-3 hours before bed.  Switch to whole wheat bread, pasta, and brown rice.  Make healthier choices when you eat out. No fries!  Pick baked chicken, NOT fried.  Don't forget to SLOW DOWN when you eat.  It is not going anywhere.   Take the stairs.  Park far away in the parking lot  Lift soup cans (or weights) for 10 minutes while watching TV.  Walk at work for 10 minutes during break.  Walk outside 1 time a week with your friend, kids, dog, or significant other.  Start a walking group at church.  Walk the mall as  much as you can tolerate.   Keep a food diary.  Weigh yourself daily.  Walk for 15 minutes 3 days per week.  Cook at home more often and eat out less. If life happens and you go back to old habits, it is okay.  Just start over. You can do it!  If you experience chest pain, get short of breath, or tired during the exercise, please stop immediately and inform your doctor.    Before you even begin to attack a weight-loss plan, it pays to remember this: You are not fat. You have fat. Losing weight isn't about blame or shame; it's simply another achievement to accomplish. Dieting is like any other skill-you have to buckle down and work at it. As long as you act in a smart, reasonable way, you'll ultimately get where you want to be. Here are some weight loss pearls for you.   1. It's Not a Diet. It's a Lifestyle Thinking of a diet as something you're on and suffering through only for the short term doesn't work. To shed weight and keep it off, you need to make permanent changes to the way you eat. It's OK to indulge occasionally, of course, but if you cut calories temporarily and then revert to your old way of eating, you'll gain back the weight quicker than you can say yo-yo. Use it to lose it. Research shows that one of the best predictors of long-term weight loss is how many pounds you drop in the first month. For that reason, nutritionists often suggest being stricter for the first two weeks of your new eating strategy to build momentum. Cut out added sugar and alcohol and avoid unrefined carbs. After that, figure out how you can reincorporate them in a way that's healthy and maintainable.  2. There's a Right Way to Exercise Working out burns calories and fat and boosts your metabolism by building muscle. But those trying to lose weight are notorious for overestimating the number of calories they burn and underestimating the amount they take in. Unfortunately, your system is biologically programmed  to hold on to extra pounds and that means when you start exercising, your body senses the deficit and ramps up its hunger signals. If you're not diligent, you'll eat everything you burn and then some. Use it, to lose it. Cardio gets all the exercise glory, but strength and interval training are the real heroes. They help you build lean muscle, which in turn increases your metabolism and calorie-burning ability 3. Don't Overreact to Mild Hunger Some people have a hard time losing weight because of hunger anxiety. To them, being hungry is bad-something to be avoided at all costs-so they carry snacks with them and eat when they don't need to. Others eat because they're stressed out or bored. While you never want to get to the point of being ravenous (that's when bingeing is likely to happen), a hunger pang, a craving, or the fact that it's 3:00 p.m. should not send you racing for the vending machine or obsessing about the energy bar in your purse. Ideally, you should put off  eating until your stomach is growling and it's difficult to concentrate.  Use it to lose it. When you feel the urge to eat, use the HALT method. Ask yourself, Am I really hungry? Or am I angry or anxious, lonely or bored, or tired? If you're still not certain, try the apple test. If you're truly hungry, an apple should seem delicious; if it doesn't, something else is going on. Or you can try drinking water and making yourself busy, if you are still hungry try a healthy snack.  4. Not All Calories Are Created Equal The mechanics of weight loss are pretty simple: Take in fewer calories than you use for energy. But the kind of food you eat makes all the difference. Processed food that's high in saturated fat and refined starch or sugar can cause inflammation that disrupts the hormone signals that tell your brain you're full. The result: You eat a lot more.  Use it to lose it. Clean up your diet. Swap in whole, unprocessed foods, including  vegetables, lean protein, and healthy fats that will fill you up and give you the biggest nutritional bang for your calorie buck. In a few weeks, as your brain starts receiving regular hunger and fullness signals once again, you'll notice that you feel less hungry overall and naturally start cutting back on the amount you eat.  5. Protein, Produce, and Plant-Based Fats Are Your Weight-Loss Trinity Here's why eating the three Ps regularly will help you drop pounds. Protein fills you up. You need it to build lean muscle, which keeps your metabolism humming so that you can torch more fat. People in a weight-loss program who ate double the recommended daily allowance for protein (about 110 grams for a 150-pound woman) lost 70 percent of their weight from fat, while people who ate the RDA lost only about 40 percent, one study found. Produce is packed with filling fiber. "It's very difficult to consume too many calories if you're eating a lot of vegetables. Example: Three cups of broccoli is a lot of food, yet only 93 calories. (Fruit is another story. It can be easy to overeat and can contain a lot of calories from sugar, so be sure to monitor your intake.) Plant-based fats like olive oil and those in avocados and nuts are healthy and extra satiating.  Use it to lose it. Aim to incorporate each of the three Ps into every meal and snack. People who eat protein throughout the day are able to keep weight off, according to a study in the [place] of Clinical Nutrition. In addition to meat, poultry and seafood, good sources are beans, lentils, eggs, tofu, and yogurt. As for fat, keep portion sizes in check by measuring out salad dressing, oil, and nut butters (shoot for one to two tablespoons). Finally, eat veggies or a little fruit at every meal. People who did that consumed 308 fewer calories but didn't feel any hungrier than when they didn't eat more produce.  7. How You Eat Is As Important As What You  Eat In order for your brain to register that you're full, you need to focus on what you're eating. Sit down whenever you eat, preferably at a table. Turn off the TV or computer, put down your phone, and look at your food. Smell it. Chew slowly, and don't put another bite on your fork until you swallow. When women ate lunch this attentively, they consumed 30 percent less when snacking later than those who listened to an  audiobook at lunchtime, according to a study in the [place] of Nutrition. 8. Weighing Yourself Really Works The scale provides the best evidence about whether your efforts are paying off. Seeing the numbers tick up or down or stagnate is motivation to keep going-or to rethink your approach. A 2015 study at [facility] found that daily weigh-ins helped people lose more weight, keep it off, and maintain that loss, even after two years. Use it to lose it. Step on the scale at the same time every day for the best results. If your weight shoots up several pounds from one weigh-in to the next, don't freak out. Eating a lot of salt the night before or having your period is the likely culprit. The number should return to normal in a day or two. It's a steady climb that you need to do something about. 9. Too Much Stress and Too Little Sleep Are Your Enemies When you're tired and frazzled, your body cranks up the production of cortisol, the stress hormone that can cause carb cravings. Not getting enough sleep also boosts your levels of ghrelin, a hormone associated with hunger, while suppressing leptin, a hormone that signals fullness and satiety. People on a diet who slept only five and a half hours a night for two weeks lost 55 percent less fat and were hungrier than those who slept eight and a half hours, according to a study in the [place]. Use it to lose it. Prioritize sleep, aiming for seven hours or more a night, which research shows helps lower stress.  And make sure you're getting quality zzz's. If a snoring spouse or a fidgety cat wakes you up frequently throughout the night, you may end up getting the equivalent of just four hours of sleep, according to a study from [facility]. Keep pets out of the bedroom, and use a white-noise app to drown out snoring. 10. You Will Hit a plateau-And You Can Bust Through It As you slim down, your body releases much less leptin, the fullness hormone.  If you're not strength training, start right now. Building muscle can raise your metabolism to help you overcome a plateau. To keep your body challenged and burning calories, incorporate new moves and more intense intervals into your workouts or add another sweat session to your weekly routine. Alternatively, cut an extra 100 calories or so a day from your diet. Now that you've lost weight, your body simply doesn't need as much fuel.    Since food equals calories, in order to lose weight you must either eat fewer calories, exercise more to burn off calories with activity, or both. Food that is not used to fuel the body is stored as fat. A major component of losing weight is to make smarter food choices. Here's how:  1)   Limit non-nutritious foods, such as: Sugar, honey, syrups and candy Pastries, donuts, pies, cakes and cookies Soft drinks, sweetened juices and alcoholic beverages  2)  Cut down on high-fat foods by: - Choosing poultry, fish or lean red meat - Choosing low-fat cooking methods, such as baking, broiling, steaming, grilling and boiling - Using low-fat or non-fat dairy products - Using vinaigrette, herbs, lemon or fat-free salad dressings - Avoiding fatty meats, such as bacon, sausage, franks, ribs and luncheon meats - Avoiding high-fat snacks like nuts, chips and chocolate - Avoiding fried foods - Using less butter, margarine, oil and mayonnaise - Avoiding high-fat gravies, cream sauces and cream-based soups  3)  Eat a variety of  foods, including: - Fruit and vegetables that are raw, steamed or baked - Whole grains, breads, cereal, rice and pasta - Dairy products, such as low-fat or non-fat milk or yogurt, low-fat cottage cheese and low-fat cheese - Protein-rich foods like chicken, [place], fish, lean meat and legumes, or beans  4) Change your eating habits by: - Eat three balanced meals a day to help control your hunger - Watch portion sizes and eat small servings of a variety of foods - Choose low-calorie snacks - Eat only when you are hungry and stop when you are satisfied - Eat slowly and try not to perform other tasks while eating - Find other activities to distract you from food, such as walking, taking up a hobby or being involved in the community - Include regular exercise in your daily routine ( minimum of 20 min of moderate-intensity exercise at least 5 days/week)  - Find a support group, if necessary, for emotional support in your weight loss journey           Easy ways to cut 100 calories   1. Eat your eggs with hot sauce OR salsa instead of cheese.  Eggs are great for breakfast, but many people consider eggs and cheese to be BFFs. Instead of cheese-1 oz. of cheddar has 114 calories-top your eggs with hot sauce, which contains no calories and helps with satiety and metabolism. Salsa is also a great option!!  2. Top your toast, waffles or pancakes with fresh berries instead of jelly or syrup. Half a cup of berries-fresh, frozen or thawed-has about 40 calories, compared with 2 tbsp. of maple syrup or jelly, which both have about 100 calories. The berries will also give you a good punch of fiber, which helps keep you full and satisfied and won't spike blood sugar quickly like the jelly or syrup. 3. Swap the non-fat latte for black coffee with a splash of half-and-half. Contrary to its name, that non-fat latte has 130 calories and a startling 19g of carbohydrates per 16 oz. serving. Replacing that  'light' drinkable dessert with a black coffee with a splash of half-and-half saves you more than 100 calories per 16 oz. serving. 4. Sprinkle salads with freeze-dried raspberries instead of dried cranberries. If you want a sweet addition to your nutritious salad, stay away from dried cranberries. They have a whopping 130 calories per  cup and 30g carbohydrates. Instead, sprinkle freeze-dried raspberries guilt-free and save more than 100 calories per  cup serving, adding 3g of belly-filling fiber. 5. Go for mustard in place of mayo on your sandwich. Mustard can add really nice flavor to any sandwich, and there are tons of varieties, from spicy to honey. A serving of mayo is 95 calories, versus 10 calories in a serving of mustard.  Or try an avocado mayo spread: You can find the recipe few click this link: https://www.californiaavocado.com/recipes/recipe-container/[place]-avocado-mayo 6. Choose a DIY salad dressing instead of the store-bought kind. Mix Dijon or whole grain mustard with low-fat Kefir or red wine vinegar and garlic. 7. Use hummus as a spread instead of a dip. Use hummus as a spread on a high-fiber cracker or tortilla with a sandwich and save on calories without sacrificing taste. 8. Pick just one salad "accessory." Salad isn't automatically a calorie winner. It's easy to over-accessorize with toppings. Instead of topping your salad with nuts, avocado and cranberries (all three will clock in at 313 calories), just pick one. The next day, choose a different  accessory, which will also keep your salad interesting. You don't wear all your jewelry every day, right? 9. Ditch the white pasta in favor of spaghetti squash. One cup of cooked spaghetti squash has about 40 calories, compared with traditional spaghetti, which comes with more than 200. Spaghetti squash is also nutrient-dense. It's a good source of fiber and Vitamins A and C, and it can be eaten just like you would eat pasta-with a  great tomato sauce and [place] meatballs or with pesto, tofu and spinach, for example. 10. Dress up your chili, soups and stews with non-fat [place] yogurt instead of sour cream. Just a 'dollop' of sour cream can set you back 115 calories and a whopping 12g of fat-seven of which are of the artery-clogging variety. Added bonus: [place] yogurt is packed with muscle-building protein, calcium and B Vitamins. 11. Mash cauliflower instead of mashed potatoes. One cup of traditional mashed potatoes-in all their creamy goodness-has more than 200 calories, compared to mashed cauliflower, which you can typically eat for less than 100 calories per 1 cup serving. Cauliflower is a great source of the antioxidant indole-3-carbinol (I3C), which may help reduce the risk of some cancers, like breast cancer. 12. Ditch the ice cream sundae in favor of a [place] yogurt parfait. Instead of a cup of ice cream or fro-yo for dessert, try 1 cup of nonfat Greek yogurt topped with fresh berries and a sprinkle of cacao nibs. Both toppings are packed with antioxidants, which can help reduce cellular inflammation and oxidative damage. And the comparison is a no-brainer: One cup of ice cream has about 275 calories; one cup of frozen yogurt has about 230; and a cup of Greek yogurt has just 130, plus twice the protein, so you're less likely to return to the freezer for a second helping. 13. Put olive oil in a spray container instead of using it directly from the bottle. Each tablespoon of olive oil is 120 calories and 15g of fat. Use a mister instead of pouring it straight into the pan or onto a salad. This allows for portion control and will save you more than 100 calories. 14. When baking, substitute canned pumpkin for butter or oil. Canned pumpkin-not pumpkin pie mix-is loaded with Vitamin A, which is important for skin and eye health, as well as immunity. And the comparisons are pretty crazy:  cup of canned pumpkin has about 40 calories,  compared to butter or oil, which has more than 800 calories. Yes, 800 calories. Applesauce and mashed banana can also serve as good substitutions for butter or oil, usually in a 1:1 ratio. 15. Top casseroles with high-fiber cereal instead of breadcrumbs. Breadcrumbs are typically made with white bread, while breakfast cereals contain 5-9g of fiber per serving. Not only will you save more than 150 calories per  cup serving, the swap will also keep you more full and you'll get a metabolism boost from the added fiber. 16. Snack on pistachios instead of macadamia nuts. Believe it or not, you get the same amount of calories from 35 pistachios (100 calories) as you would from only five macadamia nuts. 17. Chow down on kale chips rather than potato chips. This is my favorite 'don't knock it 'till you try it' swap. Kale chips are so easy to make at home, and you can spice them up with a little grated parmesan or chili powder. Plus, they're a mere fraction of the calories of potato chips, but with the same crunch factor we crave so often.  18. Add seltzer and some fruit slices to your cocktail instead of soda or fruit juice. One cup of soda or fruit juice can pack on as much as 140 calories. Instead, use seltzer and fruit slices. The fruit provides valuable phytochemicals, such as flavonoids and anthocyanins, which help to combat cancer and stave off the aging process.

## 2017-03-16 ENCOUNTER — Encounter: Payer: Self-pay | Admitting: Physical Therapy

## 2017-03-16 ENCOUNTER — Ambulatory Visit: Payer: BC Managed Care – PPO | Admitting: Physical Therapy

## 2017-03-16 DIAGNOSIS — M6281 Muscle weakness (generalized): Secondary | ICD-10-CM

## 2017-03-16 DIAGNOSIS — M5441 Lumbago with sciatica, right side: Secondary | ICD-10-CM

## 2017-03-16 DIAGNOSIS — M5442 Lumbago with sciatica, left side: Principal | ICD-10-CM

## 2017-03-16 DIAGNOSIS — R293 Abnormal posture: Secondary | ICD-10-CM

## 2017-03-16 NOTE — Patient Instructions (Signed)
Bracing With March in Bridging (Hook-Lying)    With neutral spine, tighten pelvic floor and abdominals and hold. Lift bottom and hold, then march in place. Work up to marching _15-20__ times. Do _1__ times a day.   Once you are able to properly complete 15-20 reps of this...  Bridging (Single Leg)    Lie on back with feet shoulder width apart and right leg straight. Lift hips toward the ceiling while keeping leg straight. Hold __3-5__ seconds. Repeat __5-15__ times. Do _1__ sessions per day.

## 2017-03-16 NOTE — Therapy (Signed)
[place], [place], [address] Phone: [phone]   Fax:  [phone]  Physical Therapy Treatment/Discharge  Patient Details  Name: Paula Conway MRN: [id number] Date of Birth: 1955-04-08 Referring Provider: Mellody Dance, DO   Encounter Date: 03/16/2017  PT End of Session - 03/16/17 0834    Visit Number  8    Authorization Type  [employer]    PT Start Time  0800    PT Stop Time  0832    PT Time Calculation (min)  32 min    Activity Tolerance  Patient tolerated treatment well    Behavior During Therapy  [facility] for tasks assessed/performed       Past Medical History:  Diagnosis Date  . Allergy   . Arthritis   . Environmental and seasonal allergies 07/20/2015  . Family history of breast cancer in Mother 07/20/2015   Mother was diagnosed at 89 years old and died age 97 of breast cancer   . GERD (gastroesophageal reflux disease)   . Hyperlipidemia   . Obesity 07/20/2015  . Spinal stenosis     Past Surgical History:  Procedure Laterality Date  . APPENDECTOMY    . core biopsy      There were no vitals filed for this visit.  Subjective Assessment - 03/16/17 0759    Subjective  back is better; correcting walking helps with pain into LLE    How long can you walk comfortably?  20-30 min with proper form    Patient Stated Goals  improve pain, be more comfortable    Currently in Pain?  No/denies         [facility] PT Assessment - 03/16/17 0811      Observation/Other Assessments   Focus on Therapeutic Outcomes (FOTO)   70 (30% limited)      Strength   Right Hip Flexion  5/5    Right Hip Extension  4/5    Right Hip ABduction  5/5    Left Hip Flexion  4/5    Left Hip Extension  4+/5    Left Hip ABduction  5/5                  [facility] Adult PT Treatment/Exercise - 03/16/17 0802      Self-Care   Other Self-Care Comments   reviewed posture/body mechanics and discussed gradual exercise  progression and exercises to avoid.  pt verbalized understanding      Lumbar Exercises: Aerobic   Elliptical  L5/I5 x 3 min; cues for posture and core    Nustep  L5 x 6 min      Lumbar Exercises: Supine   Bridge with March  10 reps    Single Leg Bridge  5 reps             PT Education - 03/16/17 [phone]    Education provided  Yes    Education Details  see self care    Person(s) Educated  Patient    Methods  Explanation    Comprehension  Verbalized understanding          PT Long Term Goals - 03/16/17 0834      PT LONG TERM GOAL #1   Title  independent with HEP    Status  Achieved      PT LONG TERM GOAL #2   Title  verbalize understanding of posture/body mechanics to decrease risk of reinjury    Status  Achieved  PT LONG TERM GOAL #3   Title  report ability to walk > 15 min without increase in pain for improved function    Status  Achieved      PT LONG TERM GOAL #4   Title  improve bil hip strength to at least 4/5 for improved stability and function    Status  Achieved            Plan - 03/16/17 0835    Clinical Impression Statement  Pt has met all goals and is ready for d/c from PT today.  Has extensive HEP and is compliant as well as going to yoga, knowing how to modify poses PRN.      PT Treatment/Interventions  ADLs/Self Care Home Management;Moist Heat;Electrical Stimulation;Cryotherapy;Ultrasound;Traction;Gait training;Stair training;Functional mobility training;Therapeutic activities;Therapeutic exercise;Balance training;Neuromuscular re-education;Patient/family education;Manual techniques;Dry needling;Taping    PT Next Visit Plan  d/c PT    PT Home Exercise Plan  Lumbar stab 1 exercises, PNF patterns on side,  single leg bridge,  Ball exercises sitting    Consulted and Agree with Plan of Care  Patient       Patient will benefit from skilled therapeutic intervention in order to improve the following deficits and impairments:  Abnormal gait, Pain,  Postural dysfunction, Impaired flexibility, Increased fascial restricitons, Increased muscle spasms, Improper body mechanics, Decreased strength, Decreased mobility, Difficulty walking  Visit Diagnosis: Acute bilateral low back pain with bilateral sciatica  Abnormal posture  Muscle weakness (generalized)     Problem List Patient Active Problem List   Diagnosis Date Noted  . Vitamin D insufficiency 03/15/2017  . Lumbar discogenic pain syndrome 02/15/2017  . Radiculopathy, lumbar region 02/15/2017  . Arthritis of facet joints at multiple vertebral levels ([place]) 02/15/2017  . Low back pain 02/15/2017  . Glucose intolerance (impaired glucose tolerance) 09/14/2016  . Obesity, Class I, BMI 30-34.9 08/30/2016  . Health education/counseling 08/30/2016  . Prediabetes 07/30/2015  . Elevated LDL cholesterol level 07/30/2015  . Elevated TSH 07/30/2015  . Family history of breast cancer in Mother 07/20/2015  . GERD (gastroesophageal reflux disease) 07/20/2015  . Generalized OA 07/20/2015  . Hyperlipidemia 07/20/2015  . Environmental and seasonal allergies 07/20/2015  . Spinal stenosis: Chronic back pain  07/20/2015  . Encounter for screening mammogram for breast cancer 07/16/2015  . Neoplasm of connective tissue 11/23/2011      Laureen Abrahams, PT, DPT 03/16/17 8:36 AM    [facility] [address] [place], [place], [address] Phone: [phone]   Fax:  [phone]  Name: Paula Conway MRN: [id number] Date of Birth: 02/05/1955      PHYSICAL THERAPY DISCHARGE SUMMARY  Visits from Start of Care: 8  Current functional level related to goals / functional outcomes: See above   Remaining deficits: See above   Education / Equipment: HEP, posture/body mechanics  Plan: Patient agrees to discharge.  Patient goals were met. Patient is being discharged due to meeting the stated rehab goals.  ?????     Laureen Abrahams, PT, DPT 03/16/17 8:37 AM  [facility] [address], [place] [address]  [phone] (office) [phone] (fax)

## 2017-04-18 ENCOUNTER — Ambulatory Visit: Payer: BC Managed Care – PPO | Admitting: Family Medicine

## 2017-06-12 ENCOUNTER — Encounter: Payer: Self-pay | Admitting: Family Medicine

## 2017-06-13 ENCOUNTER — Other Ambulatory Visit: Payer: Self-pay

## 2017-06-13 DIAGNOSIS — M4800 Spinal stenosis, site unspecified: Secondary | ICD-10-CM

## 2017-06-13 DIAGNOSIS — M47819 Spondylosis without myelopathy or radiculopathy, site unspecified: Secondary | ICD-10-CM

## 2017-06-13 DIAGNOSIS — M545 Low back pain: Secondary | ICD-10-CM

## 2017-06-13 DIAGNOSIS — M5126 Other intervertebral disc displacement, lumbar region: Secondary | ICD-10-CM

## 2017-06-13 DIAGNOSIS — M5416 Radiculopathy, lumbar region: Secondary | ICD-10-CM

## 2017-06-13 MED ORDER — PREGABALIN 75 MG PO CAPS: 75.0000 mg | ORAL_CAPSULE | Freq: Two times a day (BID) | ORAL | 3 refills | Status: DC

## 2017-07-11 ENCOUNTER — Encounter (INDEPENDENT_AMBULATORY_CARE_PROVIDER_SITE_OTHER): Payer: BC Managed Care – PPO | Admitting: Ophthalmology

## 2017-07-11 DIAGNOSIS — H43813 Vitreous degeneration, bilateral: Secondary | ICD-10-CM

## 2017-07-11 DIAGNOSIS — H2513 Age-related nuclear cataract, bilateral: Secondary | ICD-10-CM | POA: Diagnosis not present

## 2017-07-11 DIAGNOSIS — H35372 Puckering of macula, left eye: Secondary | ICD-10-CM | POA: Diagnosis not present

## 2017-07-24 ENCOUNTER — Ambulatory Visit (INDEPENDENT_AMBULATORY_CARE_PROVIDER_SITE_OTHER): Payer: BC Managed Care – PPO | Admitting: Family Medicine

## 2017-07-24 ENCOUNTER — Encounter: Payer: Self-pay | Admitting: Family Medicine

## 2017-07-24 VITALS — BP 120/78 | HR 62 | Ht 64.0 in | Wt 200.9 lb

## 2017-07-24 DIAGNOSIS — E559 Vitamin D deficiency, unspecified: Secondary | ICD-10-CM | POA: Diagnosis not present

## 2017-07-24 DIAGNOSIS — E785 Hyperlipidemia, unspecified: Secondary | ICD-10-CM

## 2017-07-24 DIAGNOSIS — H8110 Benign paroxysmal vertigo, unspecified ear: Secondary | ICD-10-CM | POA: Insufficient documentation

## 2017-07-24 DIAGNOSIS — R7302 Impaired glucose tolerance (oral): Secondary | ICD-10-CM

## 2017-07-24 DIAGNOSIS — R7989 Other specified abnormal findings of blood chemistry: Secondary | ICD-10-CM

## 2017-07-24 DIAGNOSIS — Z803 Family history of malignant neoplasm of breast: Secondary | ICD-10-CM

## 2017-07-24 DIAGNOSIS — Z719 Counseling, unspecified: Secondary | ICD-10-CM

## 2017-07-24 DIAGNOSIS — H6983 Other specified disorders of Eustachian tube, bilateral: Secondary | ICD-10-CM

## 2017-07-24 DIAGNOSIS — E669 Obesity, unspecified: Secondary | ICD-10-CM

## 2017-07-24 DIAGNOSIS — M545 Low back pain: Secondary | ICD-10-CM

## 2017-07-24 MED ORDER — FLUTICASONE PROPIONATE 50 MCG/ACT NA SUSP: NASAL | 5 refills | Status: DC

## 2017-07-24 NOTE — Patient Instructions (Addendum)
How to Treat Vertigo at Home with Exercises  What is Vertigo?  Vertigo is a relatively common symptom most often associated with conditions such as sinusitis (inflammation of your sinuses due to viruses, allergies, or bacterial infections), or an inner ear infection or ear trauma.   It can be brought on by trauma (e.g. a blow to the head or whiplash) or more serious things like minor strokes.   Symptoms can also be brought on by normal degenerative changes to your inner ear that occur with aging.  The condition tends to be more commonly seen in the elderly but it can occur in all ages.    Patients most often complain of dizziness, as if the room is spinning around them.   Symptoms are provoked by quick head movements or changes in position like going from standing to lying in bed, or even turning over in bed.   It may present with nausea and/or vomiting, and can be very debilitating to some folks.    By far the most common cause, known as Benign Paroxysmal Positional Vertigo (BPPV), is categorized by a sudden onset of symptoms, that are intense but short-lived (60 seconds or less), which is triggered by a change in head position.   Symptoms usually dissipate if you stay in one position and do not move your head.   Within the inner ear are collections of calcium carbonate crystals referred to as otoliths which may become dislodged from their normal position and migrate into the semicircular canals of the inner ear, throwing off your body's ability to sense where you are in space.     Fig. 921 Anatomy of the Right Osseous Labyrinth. Antonieta Iba. Anatomy of the Human Body. 1918.            What Else Could Be Behind My Vertigo?  Some other causes of vertigo include:  Menieres disease (disorder of inner ear with ringing in ears, feeling of fullness/pressure within ear, and fluctuating hearing loss) Tumours Neurological disorders e.g. Multiple Sclerosis Motion Sickness (lack of coordination  between visual stimuli, inner ear balance and positional sense) Migraine Labyrinthitis (inflammation of the fluid-filled tubes and sacs within the inner ear; may also be associated with changes in hearing) Vestibular neuritis (inflammation of the nerves associated with transmission of sensory info from the inner ear; usually of viral origins)  How it can be treated/cured? While certain medications have been prescribed for vertigo including Lorazepam your doing well 7 house the house going organizing and getting things ready for sale with the and Meclizine (for motion sickness), there exists no evidence to support a recommendation of any medication in the routine treatment of BPPV.  Clinical trials have demonstrated that repositioning techniques (listed below) are a superior option for management Otis Dials et al., 2008).    Figure above:  (A) Instructions for the modified Epley procedure (MEP) for left ear posterior canal benign paroxysmal positional vertigo (PC-BPPV). For right ear BPPV, the procedure has to be performed in the opposite direction, starting with the head turned to the right side.  1. Start by sitting on a bed with your head turned 45 to the left. Place a pillow behind you so that on lying back it will be under your shoulders.  2. Lie back quickly with shoulders on the pillow, neck extended, and head resting on the bed. In this position, the affected (left) ear is underneath. Wait for 30 secondS.  3. Turn your head 90 to the right (without raising it), and  wait again for 30 seconds.  4. Turn your body and head another 90 to the right, and wait for another 30 seconds.  5. Sit up on the right side. This maneuver should be performed three times a day. Repeat this daily until you are free from positional vertigo for 24 hours.   (B) Instructions for the modified Semont maneuver (MSM) for left ear PC-BPPV. For right ear BPPV, the maneuver has to be performed in the opposite direction,  starting with the head turned toward the left ear.  1. Sit upright on a bed with your head turned 45 toward the right ear.  2. Drop quickly to the left side, so that your head touches the bed behind your left ear. Wait 30 seconds.  3. Move head and trunk in a swift movement toward the other side without stopping in the upright position, so that your head comes to rest on the right side of your forehead. Wait again for 30 seconds.  4. Sit up again.  This maneuver should be performed three times a day. Repeat this daily until you are free from positional vertigo symptoms for 24 hours.   (   See the video in the supplementary material on the NeurologyWeb site; go to http://www.neurology.org/content/63/1/150/F1.expansion.html   )     You can also try this motion at home as well- Self-Treatment of Benign Paroxysmal Positional Vertigo Benign Paroxysmal Positioning Vertigo is caused by loose inner ear crystals in the inner ear that migrate while sleeping to the back-bottom inner ear balance canal, the so-called posterior semi-circular canal. The maneuver demonstrated below is the way to reposition the loose crystals so that the symptoms caused by the loose crystals go away. You may have a floating, swaying sense while walking or sitting for a few days after this procedure.             Risk factors for prediabetes and type 2 diabetes  Researchers don't fully understand why some people develop prediabetes and type 2 diabetes and others don't.  It's clear that certain factors increase the risk, however, including:  Weight. The more fatty tissue you have, the more resistant your cells become to insulin.  Inactivity. The less active you are, the greater your risk. Physical activity helps you control your weight, uses up glucose as energy and makes your cells more sensitive to insulin.  Family history. Your risk increases if a parent or sibling has type 2 diabetes.  Race. Although it's unclear  why, people of certain races -- including blacks, Hispanics, American Indians and Asian-Americans -- are at higher risk.  Age. Your risk increases as you get older. This may be because you tend to exercise less, lose muscle mass and gain weight as you age. But type 2 diabetes is also increasing dramatically among children, adolescents and younger adults.  Gestational diabetes. If you developed gestational diabetes when you were pregnant, your risk of developing prediabetes and type 2 diabetes later increases. If you gave birth to a baby weighing more than 9 pounds (4 kilograms), you're also at risk of type 2 diabetes.  Polycystic ovary syndrome. For women, having polycystic ovary syndrome -- a common condition characterized by irregular menstrual periods, excess hair growth and obesity -- increases the risk of diabetes.  High blood pressure. Having blood pressure over 140/90 millimeters of mercury (mm Hg) is linked to an increased risk of type 2 diabetes.  Abnormal cholesterol and triglyceride levels. If you have low levels of high-density lipoprotein (HDL),  or "good," cholesterol, your risk of type 2 diabetes is higher. Triglycerides are another type of fat carried in the blood. People with high levels of triglycerides have an increased risk of type 2 diabetes. Your doctor can let you know what your cholesterol and triglyceride levels are.  A good guide to good carbs: The glycemic index ---If you have diabetes, or at risk for diabetes, you know all too well that when you eat carbohydrates, your blood sugar goes up. The total amount of carbs you consume at a meal or in a snack mostly determines what your blood sugar will do. But the food itself also plays a role. A serving of white rice has almost the same effect as eating pure table sugar -- a quick, high spike in blood sugar. A serving of lentils has a slower, smaller effect.  ---Picking good sources of carbs can help you control your blood sugar and  your weight. Even if you don't have diabetes, eating healthier carbohydrate-rich foods can help ward off a host of chronic conditions, from heart disease to various cancers to, well, diabetes.  ---One way to choose foods is with the glycemic index (GI). This tool measures how much a food boosts blood sugar.  The glycemic index rates the effect of a specific amount of a food on blood sugar compared with the same amount of pure glucose. A food with a glycemic index of 28 boosts blood sugar only 28% as much as pure glucose. One with a GI of 95 acts like pure glucose.    High glycemic foods result in a quick spike in insulin and blood sugar (also known as blood glucose).  Low glycemic foods have a slower, smaller effect- these are healthier for you.   Using the glycemic index Using the glycemic index is easy: choose foods in the low GI category instead of those in the high GI category (see below), and go easy on those in between. Low glycemic index (GI of 55 or less): Most fruits and vegetables, beans, minimally processed grains, pasta, low-fat dairy foods, and nuts.  Moderate glycemic index (GI 56 to 69): White and sweet potatoes, corn, white rice, couscous, breakfast cereals such as Cream of Wheat and Mini Wheats.  High glycemic index (GI of 70 or higher): White bread, rice cakes, most crackers, bagels, cakes, doughnuts, croissants, most packaged breakfast cereals. You can see the values for 100 commons foods and get links to more at www.health.CheapToothpicks.si.  Swaps for lowering glycemic index  Instead of this high-glycemic index food Eat this lower-glycemic index food  White rice Brown rice or converted rice  Instant oatmeal Steel-cut oats  Cornflakes Bran flakes  Baked potato Pasta, bulgur  White bread Whole-grain bread  Corn Peas or leafy greens       Prediabetes Eating Plan  Prediabetes--also called impaired glucose tolerance or impaired fasting glucose--is a condition that  causes blood sugar (blood glucose) levels to be higher than normal. Following a healthy diet can help to keep prediabetes under control. It can also help to lower the risk of type 2 diabetes and heart disease, which are increased in people who have prediabetes. Along with regular exercise, a healthy diet:  Promotes weight loss.  Helps to control blood sugar levels.  Helps to improve the way that the body uses insulin.   WHAT DO I NEED TO KNOW ABOUT THIS EATING PLAN?   Use the glycemic index (GI) to plan your meals. The index tells you how quickly a  food will raise your blood sugar. Choose low-GI foods. These foods take a longer time to raise blood sugar.  Pay close attention to the amount of carbohydrates in the food that you eat. Carbohydrates increase blood sugar levels.  Keep track of how many calories you take in. Eating the right amount of calories will help you to achieve a healthy weight. Losing about 7 percent of your starting weight can help to prevent type 2 diabetes.  You may want to follow a Mediterranean diet. This diet includes a lot of vegetables, lean meats or fish, whole grains, fruits, and healthy oils and fats.   WHAT FOODS CAN I EAT?  Grains Whole grains, such as whole-wheat or whole-grain breads, crackers, cereals, and pasta. Unsweetened oatmeal. Bulgur. Barley. Quinoa. Brown rice. Corn or whole-wheat flour tortillas or taco shells. Vegetables Lettuce. Spinach. Peas. Beets. Cauliflower. Cabbage. Broccoli. Carrots. Tomatoes. Squash. Eggplant. Herbs. Peppers. Onions. Cucumbers. Brussels sprouts. Fruits Berries. Bananas. Apples. Oranges. Grapes. Papaya. Mango. Pomegranate. Kiwi. Grapefruit. Cherries. Meats and Other Protein Sources Seafood. Lean meats, such as chicken and [place] or lean cuts of pork and beef. Tofu. Eggs. Nuts. Beans. Dairy Low-fat or fat-free dairy products, such as yogurt, cottage cheese, and cheese. Beverages Water. Tea. Coffee. Sugar-free or  diet soda. Seltzer water. Milk. Milk alternatives, such as soy or almond milk. Condiments Mustard. Relish. Low-fat, low-sugar ketchup. Low-fat, low-sugar barbecue sauce. Low-fat or fat-free mayonnaise. Sweets and Desserts Sugar-free or low-fat pudding. Sugar-free or low-fat ice cream and other frozen treats. Fats and Oils Avocado. Walnuts. Olive oil. The items listed above may not be a complete list of recommended foods or beverages. Contact your dietitian for more options.    WHAT FOODS ARE NOT RECOMMENDED?  Grains Refined white flour and flour products, such as bread, pasta, snack foods, and cereals. Beverages Sweetened drinks, such as sweet iced tea and soda. Sweets and Desserts Baked goods, such as cake, cupcakes, pastries, cookies, and cheesecake. The items listed above may not be a complete list of foods and beverages to avoid. Contact your dietitian for more information.   This information is not intended to replace advice given to you by your health care provider. Make sure you discuss any questions you have with your health care provider.   Document Released: 05/13/2014 Document Reviewed: 05/13/2014 Elsevier Interactive Patient Education [employer].

## 2017-07-24 NOTE — Progress Notes (Signed)
Impression and Recommendations:    1. Glucose intolerance (impaired glucose tolerance)   2. Obesity, Class I, BMI 30-34.9   3. Hyperlipidemia, unspecified hyperlipidemia type   4. Vitamin D insufficiency   5. Family history of breast cancer in Mother   45. Elevated TSH   7. Health education/counseling   8. Low back pain, unspecified back pain laterality, unspecified chronicity, with sciatica presence unspecified   9. Dysfunction of both eustachian tubes ( air-fluid levels L TM)   10. BPPV (benign paroxysmal positional vertigo), unspecified laterality    1. Prediabetes - HbA1c = 6.2 in March 2019.  Patient was advised at that time to follow up in 1-4 months to review recent weight gain, diet, and exercise.  - Counseled patient on pathophysiology of Prediabetes/DM and discussed various prevention options, which often includes dietary and lifestyle modifications as first line.  Importance of low carb/ketogenic diet discussed with patient in addition to regular exercise.    - Information provided to patient today regarding prediabetic diet and prudent care to prevent diabetes.  2. Chronic Back Pain - Patient may reduce Lyrica to once daily at night.  If the pain returns, patient knows to resume medication as advised.  Advised patient not to take medicine if she doesn't need it.  - Continue physical therapy and exercises as recommended and prescribed.  - Patient agrees to perform home back exercises three days per week.  Encouraged patient to walk regularly, to a goal of 30 minutes daily.  3. Eustachian Tube Dysfunction & Bilateral Positional Vertigo - Advised the patient to begin using AYR or Neilmed sinus rinses BID followed by flonase BID (one spray to each nostril).  Advised that the patient may also incorporate allegra or claritin PRN, or continue OTC Zyrtec as established.  - Educated patient about prevention of Eustachian Tube Dysfunction, including the critical importance of  home exercises as treatment.  - Home exercises provided today for BPV.  4. BMI Counseling Explained to patient what BMI refers to, and what it means medically.    Told patient to think about it as a "medical risk stratification measurement" and how increasing BMI is associated with increasing risk/ or worsening state of various diseases such as hypertension, hyperlipidemia, diabetes, premature OA, depression etc.  American Heart Association guidelines for healthy diet, basically Mediterranean diet, and exercise guidelines of 30 minutes 5 days per week or more discussed in detail.  Health counseling performed.  All questions answered.  5. Lifestyle & Preventative Health Maintenance - Advised patient to continue working toward exercising to improve overall mental, physical, and emotional health.    - Engage in daily physical activity.  Recommended that the patient eventually strive for at least 150 minutes of moderate cardiovascular activity per week according to guidelines established by the [facility].   - Healthy dietary habits encouraged, including low-carb, and high amounts of lean protein in diet.   - Patient should also consume adequate amounts of water - half of body weight in oz of water per day.  6. Follow-Up - Patient with history of elevated A1c and TSH.  Last labs drawn 08/31/2016.  Patient is fasting today and full lab work will be obtained.  Lipid panel, TSH, A1c drawn today.  - Patient will return for Lab FU as-needed to discuss the necessity, risks, and benefits of medication.  - Otherwise, return in 4 months for regularly scheduled chronic follow-up.  Continue to return for wellness examinations and acute visits as needed  and recommended.   Orders Placed This Encounter  Procedures  . CBC with Differential/Platelet  . Comprehensive metabolic panel  . TSH  . VITAMIN D 25 Hydroxy (Vit-D Deficiency, Fractures)  . Hemoglobin A1c  . Lipid panel  . T4, free    Meds ordered  this encounter  Medications  . fluticasone (FLONASE) 50 MCG/ACT nasal spray    Sig: 1 spray each nostril twice daily after sterile sinus rinses with AYR/neil med    Dispense:  1 g    Refill:  5    Gross side effects, risk and benefits, and alternatives of medications and treatment plan in general discussed with patient.  Patient is aware that all medications have potential side effects and we are unable to predict every side effect or drug-drug interaction that may occur.   Patient will call with any questions prior to using medication if they have concerns.  Expresses verbal understanding and consents to current therapy and treatment regimen.  No barriers to understanding were identified.  Red flag symptoms and signs discussed in detail.  Patient expressed understanding regarding what to do in case of emergency\urgent symptoms  Please see AVS handed out to patient at the end of our visit for further patient instructions/ counseling done pertaining to today's office visit.   Return for 4 mo- reck A1c, wt check, vertigo/ETD.    Note: This note was prepared with assistance of Dragon voice recognition software. Occasional wrong-word or sound-a-like substitutions may have occurred due to the inherent limitations of voice recognition software.   This document serves as a record of services personally performed by Mellody Dance, DO. It was created on her behalf by Toni Amend, a trained medical scribe. The creation of this record is based on the scribe's personal observations and the provider's statements to them.   I have reviewed the above medical documentation for accuracy and completeness and I concur.  Mellody Dance 07/24/17 9:43 AM   -----------------------------------------------------------------------------------------------------------------------------   Subjective:     HPI: Paula Conway is a 62 y.o. female who presents to [facility]  today for issues as discussed below.  States she's "hangin' in there."  Confirms that she is fasting today.  Prediabetes Patient understands that her HbA1c was 6.2 in March of 2019.  Patient acknowledges her wish to review this.  Patient does track her weight every day, and records her coffee and breakfast into her tracking app, but stops tracking after that.  States that lately she has not been committed to tracking her nutritional intake, but believes she's cut back on sugars and carbs.    Last visit, patient weighed 203; down to 200 lbs today.  She has not been exercising.  Notes "I still go to yoga every day."  Feels that the stretching is helpful to her, especially her back.  Chronic Back Pain She wants to know if she could continue Lyrica.  "I have good days and bad days."  Regarding her back, notes "when I overdo it, I know I overdo it."  At those times she gets down on the floor and performs physical therapy.  She did obtain an exercise program from her physical therapist, and has her "folder of exercises."  "I just have to make myself get down and do it."  States "it's not that I don't want to do it, I'm lazy."  Patient would like to try reducing her Lyrica to once a day at night.  Dizzy Spells/Positional Vertigo Patient  notes that she's been concerned about some potential symptoms recently, including visual changes, "little black floaters" in her vision, two spells of dizziness in the past few months, and joint pain.  She admits she might not be as hydrated as she needs to be.  She describes two moments in particular when she experienced dizziness and symptoms in her ears, and confirms that in the past she was performing exercises to control her ETD.  She was asymptomatic for two months and during this time she stopped performing the exercises.  Family History of HLD/DM Patient notes that her father had high cholesterol and diabetes.  Remote History of Smoking She quit smoking  at age 27.    Wt Readings from Last 3 Encounters:  07/24/17 200 lb 14.4 oz (91.1 kg)  03/15/17 203 lb (92.1 kg)  02/15/17 198 lb 11.2 oz (90.1 kg)   BP Readings from Last 3 Encounters:  07/24/17 120/78  03/15/17 123/83  02/15/17 136/78   Pulse Readings from Last 3 Encounters:  07/24/17 62  03/15/17 72  02/15/17 76   BMI Readings from Last 3 Encounters:  07/24/17 34.48 kg/m  03/15/17 34.57 kg/m  02/15/17 33.84 kg/m     Patient Care Team    Relationship Specialty Notifications Start End  Mellody Dance, DO PCP - General Family Medicine  07/16/15      Patient Active Problem List   Diagnosis Date Noted  . Obesity, Class I, BMI 30-34.9 08/30/2016    Priority: High  . Prediabetes 07/30/2015    Priority: High  . Elevated LDL cholesterol level 07/30/2015    Priority: High  . Hyperlipidemia 07/20/2015    Priority: High  . Family history of breast cancer in Mother 07/20/2015    Priority: Medium  . GERD (gastroesophageal reflux disease) 07/20/2015    Priority: Medium  . Generalized OA 07/20/2015    Priority: Low  . Environmental and seasonal allergies 07/20/2015    Priority: Low  . Spinal stenosis: Chronic back pain  07/20/2015    Priority: Low  . Dysfunction of both eustachian tubes 07/24/2017  . BPPV (benign paroxysmal positional vertigo), unspecified laterality 07/24/2017  . Vitamin D insufficiency 03/15/2017  . Lumbar discogenic pain syndrome 02/15/2017  . Radiculopathy, lumbar region 02/15/2017  . Arthritis of facet joints at multiple vertebral levels 02/15/2017  . Low back pain 02/15/2017  . Glucose intolerance (impaired glucose tolerance) 09/14/2016  . Health education/counseling 08/30/2016  . Elevated TSH 07/30/2015  . Encounter for screening mammogram for breast cancer 07/16/2015  . Neoplasm of connective tissue 11/23/2011    Past Medical history, Surgical history, Family history, Social history, Allergies and Medications have been entered into the  medical record, reviewed and changed as needed.    Current Meds  Medication Sig  . Ascorbic Acid (VITAMIN C) 1000 MG tablet Take 1,000 mg by mouth daily.  . Cholecalciferol (VITAMIN D3) 5000 units CAPS Take 5,000 Units by mouth daily.  . Glucos-Chond-Hyal Ac-Ca Fructo (MOVE FREE JOINT HEALTH ADVANCE PO) Take 1 tablet by mouth daily.  . Magnesium 250 MG TABS Take 1 tablet by mouth daily.  . pregabalin (LYRICA) 75 MG capsule Take 1 capsule (75 mg total) by mouth 2 (two) times daily.  . Probiotic Product (PROBIOTIC PO) Take 1 tablet by mouth daily.  . Vitamin D, Ergocalciferol, (DRISDOL) 50000 units CAPS capsule Take 1 capsule (50,000 Units total) by mouth every 7 (seven) days.    Allergies:  No Known Allergies   Review of Systems:  A  fourteen system review of systems was performed and found to be positive as per HPI.   Objective:   Blood pressure 120/78, pulse 62, height 5\' 4"  (1.626 m), weight 200 lb 14.4 oz (91.1 kg), SpO2 98 %. Body mass index is 34.48 kg/m. General:  Well Developed, well nourished, appropriate for stated age.  Neuro:  Alert and oriented,  extra-ocular muscles intact  HEENT:  Normocephalic, atraumatic, neck supple, no carotid bruits appreciated.  TM's slightly bulged bilaterally; left with air fluid levels, right without. Skin:  no gross rash, warm, pink. Cardiac:  RRR, S1 S2 Respiratory:  ECTA B/L and A/P, Not using accessory muscles, speaking in full sentences- unlabored. Vascular:  Ext warm, no cyanosis apprec.; cap RF less 2 sec. Psych:  No HI/SI, judgement and insight good, Euthymic mood. Full Affect.

## 2017-07-25 LAB — CBC WITH DIFFERENTIAL/PLATELET
BASOS ABS: 0.1 10*3/uL (ref 0.0–0.2)
Basos: 1 %
EOS (ABSOLUTE): 0.1 10*3/uL (ref 0.0–0.4)
Eos: 2 %
HEMOGLOBIN: 14 g/dL (ref 11.1–15.9)
Hematocrit: 42.4 % (ref 34.0–46.6)
IMMATURE GRANS (ABS): 0 10*3/uL (ref 0.0–0.1)
Immature Granulocytes: 0 %
LYMPHS ABS: 2.1 10*3/uL (ref 0.7–3.1)
LYMPHS: 41 %
MCH: 29.7 pg (ref 26.6–33.0)
MCHC: 33 g/dL (ref 31.5–35.7)
MCV: 90 fL (ref 79–97)
MONOCYTES: 7 %
Monocytes Absolute: 0.4 10*3/uL (ref 0.1–0.9)
NEUTROS ABS: 2.5 10*3/uL (ref 1.4–7.0)
Neutrophils: 49 %
PLATELETS: 206 10*3/uL (ref 150–450)
RBC: 4.72 x10E6/uL (ref 3.77–5.28)
RDW: 12.9 % (ref 12.3–15.4)
WBC: 5.2 10*3/uL (ref 3.4–10.8)

## 2017-07-25 LAB — COMPREHENSIVE METABOLIC PANEL
ALK PHOS: 69 IU/L (ref 39–117)
ALT: 21 IU/L (ref 0–32)
AST: 15 IU/L (ref 0–40)
Albumin/Globulin Ratio: 1.9 (ref 1.2–2.2)
Albumin: 4.4 g/dL (ref 3.6–4.8)
BILIRUBIN TOTAL: 0.4 mg/dL (ref 0.0–1.2)
BUN/Creatinine Ratio: 21 (ref 12–28)
BUN: 19 mg/dL (ref 8–27)
CHLORIDE: 105 mmol/L (ref 96–106)
CO2: 22 mmol/L (ref 20–29)
CREATININE: 0.9 mg/dL (ref 0.57–1.00)
Calcium: 9.8 mg/dL (ref 8.7–10.3)
GFR calc Af Amer: 79 mL/min/{1.73_m2} (ref >59)
GFR calc non Af Amer: 69 mL/min/{1.73_m2} (ref >59)
Globulin, Total: 2.3 g/dL (ref 1.5–4.5)
Glucose: 95 mg/dL (ref 65–99)
Potassium: 4.3 mmol/L (ref 3.5–5.2)
Sodium: 141 mmol/L (ref 134–144)
Total Protein: 6.7 g/dL (ref 6.0–8.5)

## 2017-07-25 LAB — LIPID PANEL
Chol/HDL Ratio: 4.7 ratio — ABNORMAL HIGH (ref 0.0–4.4)
Cholesterol, Total: 261 mg/dL — ABNORMAL HIGH (ref 100–199)
HDL: 56 mg/dL (ref >39)
LDL CALC: 172 mg/dL — AB (ref 0–99)
TRIGLYCERIDES: 164 mg/dL — AB (ref 0–149)
VLDL CHOLESTEROL CAL: 33 mg/dL (ref 5–40)

## 2017-07-25 LAB — VITAMIN D 25 HYDROXY (VIT D DEFICIENCY, FRACTURES): VIT D 25 HYDROXY: 65.2 ng/mL (ref 30.0–100.0)

## 2017-07-25 LAB — TSH: TSH: 4.82 u[IU]/mL — AB (ref 0.450–4.500)

## 2017-07-25 LAB — HEMOGLOBIN A1C
Est. average glucose Bld gHb Est-mCnc: 126 mg/dL
HEMOGLOBIN A1C: 6 % — AB (ref 4.8–5.6)

## 2017-07-25 LAB — T4, FREE: Free T4: 1.04 ng/dL (ref 0.82–1.77)

## 2017-10-23 ENCOUNTER — Other Ambulatory Visit: Payer: Self-pay | Admitting: Family Medicine

## 2017-10-23 DIAGNOSIS — Z1231 Encounter for screening mammogram for malignant neoplasm of breast: Secondary | ICD-10-CM

## 2017-10-24 ENCOUNTER — Ambulatory Visit
Admission: RE | Admit: 2017-10-24 | Discharge: 2017-10-24 | Disposition: A | Discharge status: Home / Self Care | Payer: BC Managed Care – PPO | Source: Ambulatory Visit | Attending: Family Medicine | Admitting: Family Medicine

## 2017-10-24 DIAGNOSIS — Z1231 Encounter for screening mammogram for malignant neoplasm of breast: Secondary | ICD-10-CM

## 2017-11-23 ENCOUNTER — Other Ambulatory Visit: Payer: Self-pay | Admitting: Family Medicine

## 2017-11-23 ENCOUNTER — Encounter: Payer: Self-pay | Admitting: Family Medicine

## 2017-11-23 ENCOUNTER — Ambulatory Visit (INDEPENDENT_AMBULATORY_CARE_PROVIDER_SITE_OTHER): Payer: BC Managed Care – PPO | Admitting: Family Medicine

## 2017-11-23 VITALS — BP 126/82 | HR 69 | Temp 97.7°F | Ht 64.0 in | Wt 200.4 lb

## 2017-11-23 DIAGNOSIS — E559 Vitamin D deficiency, unspecified: Secondary | ICD-10-CM

## 2017-11-23 DIAGNOSIS — M5416 Radiculopathy, lumbar region: Secondary | ICD-10-CM

## 2017-11-23 DIAGNOSIS — Z719 Counseling, unspecified: Secondary | ICD-10-CM | POA: Diagnosis not present

## 2017-11-23 DIAGNOSIS — E669 Obesity, unspecified: Secondary | ICD-10-CM

## 2017-11-23 DIAGNOSIS — M47819 Spondylosis without myelopathy or radiculopathy, site unspecified: Secondary | ICD-10-CM

## 2017-11-23 DIAGNOSIS — Z0000 Encounter for general adult medical examination without abnormal findings: Secondary | ICD-10-CM

## 2017-11-23 DIAGNOSIS — Z1211 Encounter for screening for malignant neoplasm of colon: Secondary | ICD-10-CM

## 2017-11-23 DIAGNOSIS — M545 Low back pain: Secondary | ICD-10-CM

## 2017-11-23 DIAGNOSIS — M5136 Other intervertebral disc degeneration, lumbar region with discogenic back pain only: Secondary | ICD-10-CM

## 2017-11-23 DIAGNOSIS — G8929 Other chronic pain: Secondary | ICD-10-CM

## 2017-11-23 DIAGNOSIS — R7989 Other specified abnormal findings of blood chemistry: Secondary | ICD-10-CM

## 2017-11-23 DIAGNOSIS — Z803 Family history of malignant neoplasm of breast: Secondary | ICD-10-CM

## 2017-11-23 DIAGNOSIS — E66811 Obesity, class 1: Secondary | ICD-10-CM

## 2017-11-23 DIAGNOSIS — M5126 Other intervertebral disc displacement, lumbar region: Secondary | ICD-10-CM

## 2017-11-23 DIAGNOSIS — M4800 Spinal stenosis, site unspecified: Secondary | ICD-10-CM

## 2017-11-23 DIAGNOSIS — R7303 Prediabetes: Secondary | ICD-10-CM

## 2017-11-23 MED ORDER — PREGABALIN 75 MG PO CAPS: 75.0000 mg | ORAL_CAPSULE | Freq: Two times a day (BID) | ORAL | 3 refills | Status: DC

## 2017-11-23 NOTE — Patient Instructions (Signed)
Preventive Care for Adults, Female  A healthy lifestyle and preventive care can promote health and wellness. Preventive health guidelines for women include the following key practices.   A routine yearly physical is a good way to check with your health care provider about your health and preventive screening. It is a chance to share any concerns and updates on your health and to receive a thorough exam.   Visit your dentist for a routine exam and preventive care every 6 months. Brush your teeth twice a day and floss once a day. Good oral hygiene prevents tooth decay and gum disease.   The frequency of eye exams is based on your age, health, family medical history, use of contact lenses, and other factors. Follow your health care provider's recommendations for frequency of eye exams.   Eat a healthy diet. Foods like vegetables, fruits, whole grains, low-fat dairy products, and lean protein foods contain the nutrients you need without too many calories. Decrease your intake of foods high in solid fats, added sugars, and salt. Eat the right amount of calories for you.Get information about a proper diet from your health care provider, if necessary.   Regular physical exercise is one of the most important things you can do for your health. Most adults should get at least 150 minutes of moderate-intensity exercise (any activity that increases your heart rate and causes you to sweat) each week. In addition, most adults need muscle-strengthening exercises on 2 or more days a week.   Maintain a healthy weight. The body mass index (BMI) is a screening tool to identify possible weight problems. It provides an estimate of body fat based on height and weight. Your health care provider can find your BMI, and can help you achieve or maintain a healthy weight.For adults 20 years and older:   - A BMI below 18.5 is considered underweight.   - A BMI of 18.5 to 24.9 is normal.   - A BMI of 25 to 29.9 is  considered overweight.   - A BMI of 30 and above is considered obese.   Maintain normal blood lipids and cholesterol levels by exercising and minimizing your intake of trans and saturated fats.  Eat a balanced diet with plenty of fruit and vegetables. Blood tests for lipids and cholesterol should begin at age 20 and be repeated every 5 years minimum.  If your lipid or cholesterol levels are high, you are over 40, or you are at high risk for heart disease, you may need your cholesterol levels checked more frequently.Ongoing high lipid and cholesterol levels should be treated with medicines if diet and exercise are not working.   If you smoke, find out from your health care provider how to quit. If you do not use tobacco, do not start.   Lung cancer screening is recommended for adults aged 55-80 years who are at high risk for developing lung cancer because of a history of smoking. A yearly low-dose CT scan of the lungs is recommended for people who have at least a 30-pack-year history of smoking and are a current smoker or have quit within the past 15 years. A pack year of smoking is smoking an average of 1 pack of cigarettes a day for 1 year (for example: 1 pack a day for 30 years or 2 packs a day for 15 years). Yearly screening should continue until the smoker has stopped smoking for at least 15 years. Yearly screening should be stopped for people who develop a   health problem that would prevent them from having lung cancer treatment.   If you are pregnant, do not drink alcohol. If you are breastfeeding, be very cautious about drinking alcohol. If you are not pregnant and choose to drink alcohol, do not have more than 1 drink per day. One drink is considered to be 12 ounces (355 mL) of beer, 5 ounces (148 mL) of wine, or 1.5 ounces (44 mL) of liquor.   Avoid use of street drugs. Do not share needles with anyone. Ask for help if you need support or instructions about stopping the use of  drugs.   High blood pressure causes heart disease and increases the risk of stroke. Your blood pressure should be checked at least yearly.  Ongoing high blood pressure should be treated with medicines if weight loss and exercise do not work.   If you are 69-55 years old, ask your health care provider if you should take aspirin to prevent strokes.   Diabetes screening involves taking a blood sample to check your fasting blood sugar level. This should be done once every 3 years, after age 38, if you are within normal weight and without risk factors for diabetes. Testing should be considered at a younger age or be carried out more frequently if you are overweight and have at least 1 risk factor for diabetes.   Breast cancer screening is essential preventive care for women. You should practice "breast self-awareness."  This means understanding the normal appearance and feel of your breasts and may include breast self-examination.  Any changes detected, no matter how small, should be reported to a health care provider.  Women in their 80s and 30s should have a clinical breast exam (CBE) by a health care provider as part of a regular health exam every 1 to 3 years.  After age 66, women should have a CBE every year.  Starting at age 1, women should consider having a mammogram (breast X-ray test) every year.  Women who have a family history of breast cancer should talk to their health care provider about genetic screening.  Women at a high risk of breast cancer should talk to their health care providers about having an MRI and a mammogram every year.   -Breast cancer gene (BRCA)-related cancer risk assessment is recommended for women who have family members with BRCA-related cancers. BRCA-related cancers include breast, ovarian, tubal, and peritoneal cancers. Having family members with these cancers may be associated with an increased risk for harmful changes (mutations) in the breast cancer genes BRCA1 and  BRCA2. Results of the assessment will determine the need for genetic counseling and BRCA1 and BRCA2 testing.   The Pap test is a screening test for cervical cancer. A Pap test can show cell changes on the cervix that might become cervical cancer if left untreated. A Pap test is a procedure in which cells are obtained and examined from the lower end of the uterus (cervix).   - Women should have a Pap test starting at age 57.   - Between ages 90 and 70, Pap tests should be repeated every 2 years.   - Beginning at age 63, you should have a Pap test every 3 years as long as the past 3 Pap tests have been normal.   - Some women have medical problems that increase the chance of getting cervical cancer. Talk to your health care provider about these problems. It is especially important to talk to your health care provider if a  new problem develops soon after your last Pap test. In these cases, your health care provider may recommend more frequent screening and Pap tests.   - The above recommendations are the same for women who have or have not gotten the vaccine for human papillomavirus (HPV).   - If you had a hysterectomy for a problem that was not cancer or a condition that could lead to cancer, then you no longer need Pap tests. Even if you no longer need a Pap test, a regular exam is a good idea to make sure no other problems are starting.   - If you are between ages 36 and 66 years, and you have had normal Pap tests going back 10 years, you no longer need Pap tests. Even if you no longer need a Pap test, a regular exam is a good idea to make sure no other problems are starting.   - If you have had past treatment for cervical cancer or a condition that could lead to cancer, you need Pap tests and screening for cancer for at least 20 years after your treatment.   - If Pap tests have been discontinued, risk factors (such as a new sexual partner) need to be reassessed to determine if screening should  be resumed.   - The HPV test is an additional test that may be used for cervical cancer screening. The HPV test looks for the virus that can cause the cell changes on the cervix. The cells collected during the Pap test can be tested for HPV. The HPV test could be used to screen women aged 70 years and older, and should be used in women of any age who have unclear Pap test results. After the age of 67, women should have HPV testing at the same frequency as a Pap test.   Colorectal cancer can be detected and often prevented. Most routine colorectal cancer screening begins at the age of 57 years and continues through age 26 years. However, your health care provider may recommend screening at an earlier age if you have risk factors for colon cancer. On a yearly basis, your health care provider may provide home test kits to check for hidden blood in the stool.  Use of a small camera at the end of a tube, to directly examine the colon (sigmoidoscopy or colonoscopy), can detect the earliest forms of colorectal cancer. Talk to your health care provider about this at age 23, when routine screening begins. Direct exam of the colon should be repeated every 5 -10 years through age 49 years, unless early forms of pre-cancerous polyps or small growths are found.   People who are at an increased risk for hepatitis B should be screened for this virus. You are considered at high risk for hepatitis B if:  -You were born in a country where hepatitis B occurs often. Talk with your health care provider about which countries are considered high risk.  - Your parents were born in a high-risk country and you have not received a shot to protect against hepatitis B (hepatitis B vaccine).  - You have HIV or AIDS.  - You use needles to inject street drugs.  - You live with, or have sex with, someone who has Hepatitis B.  - You get hemodialysis treatment.  - You take certain medicines for conditions like cancer, organ  transplantation, and autoimmune conditions.   Hepatitis C blood testing is recommended for all people born from [birth year] through 1965 and any individual  with known risks for hepatitis C.   Practice safe sex. Use condoms and avoid high-risk sexual practices to reduce the spread of sexually transmitted infections (STIs). STIs include gonorrhea, chlamydia, syphilis, trichomonas, herpes, HPV, and human immunodeficiency virus (HIV). Herpes, HIV, and HPV are viral illnesses that have no cure. They can result in disability, cancer, and death. Sexually active women aged 25 years and younger should be checked for chlamydia. Older women with new or multiple partners should also be tested for chlamydia. Testing for other STIs is recommended if you are sexually active and at increased risk.   Osteoporosis is a disease in which the bones lose minerals and strength with aging. This can result in serious bone fractures or breaks. The risk of osteoporosis can be identified using a bone density scan. Women ages 65 years and over and women at risk for fractures or osteoporosis should discuss screening with their health care providers. Ask your health care provider whether you should take a calcium supplement or vitamin D to There are also several preventive steps women can take to avoid osteoporosis and resulting fractures or to keep osteoporosis from worsening. -->Recommendations include:  Eat a balanced diet high in fruits, vegetables, calcium, and vitamins.  Get enough calcium. The recommended total intake of is 1,200 mg daily; for best absorption, if taking supplements, divide doses into 250-500 mg doses throughout the day. Of the two types of calcium, calcium carbonate is best absorbed when taken with food but calcium citrate can be taken on an empty stomach.  Get enough vitamin D. NAMS and the National Osteoporosis Foundation recommend at least 1,000 IU per day for women age 50 and over who are at risk of vitamin D  deficiency. Vitamin D deficiency can be caused by inadequate sun exposure (for example, those who live in northern latitudes).  Avoid alcohol and smoking. Heavy alcohol intake (more than 7 drinks per week) increases the risk of falls and hip fracture and women smokers tend to lose bone more rapidly and have lower bone mass than nonsmokers. Stopping smoking is one of the most important changes women can make to improve their health and decrease risk for disease.  Be physically active every day. Weight-bearing exercise (for example, fast walking, hiking, jogging, and weight training) may strengthen bones or slow the rate of bone loss that comes with aging. Balancing and muscle-strengthening exercises can reduce the risk of falling and fracture.  Consider therapeutic medications. Currently, several types of effective drugs are available. Healthcare providers can recommend the type most appropriate for each woman.  Eliminate environmental factors that may contribute to accidents. Falls cause nearly 90% of all osteoporotic fractures, so reducing this risk is an important bone-health strategy. Measures include ample lighting, removing obstructions to walking, using nonskid rugs on floors, and placing mats and/or grab bars in showers.  Be aware of medication side effects. Some common medicines make bones weaker. These include a type of steroid drug called glucocorticoids used for arthritis and asthma, some antiseizure drugs, certain sleeping pills, treatments for endometriosis, and some cancer drugs. An overactive thyroid gland or using too much thyroid hormone for an underactive thyroid can also be a problem. If you are taking these medicines, talk to your doctor about what you can do to help protect your bones.reduce the rate of osteoporosis.    Menopause can be associated with physical symptoms and risks. Hormone replacement therapy is available to decrease symptoms and risks. You should talk to your  health care provider   about whether hormone replacement therapy is right for you.   Use sunscreen. Apply sunscreen liberally and repeatedly throughout the day. You should seek shade when your shadow is shorter than you. Protect yourself by wearing long sleeves, pants, a wide-brimmed hat, and sunglasses year round, whenever you are outdoors.   Once a month, do a whole body skin exam, using a mirror to look at the skin on your back. Tell your health care provider of new moles, moles that have irregular borders, moles that are larger than a pencil eraser, or moles that have changed in shape or color.   -Stay current with required vaccines (immunizations).   Influenza vaccine. All adults should be immunized every year.  Tetanus, diphtheria, and acellular pertussis (Td, Tdap) vaccine. Pregnant women should receive 1 dose of Tdap vaccine during each pregnancy. The dose should be obtained regardless of the length of time since the last dose. Immunization is preferred during the 27th 36th week of gestation. An adult who has not previously received Tdap or who does not know her vaccine status should receive 1 dose of Tdap. This initial dose should be followed by tetanus and diphtheria toxoids (Td) booster doses every 10 years. Adults with an unknown or incomplete history of completing a 3-dose immunization series with Td-containing vaccines should begin or complete a primary immunization series including a Tdap dose. Adults should receive a Td booster every 10 years.  Varicella vaccine. An adult without evidence of immunity to varicella should receive 2 doses or a second dose if she has previously received 1 dose. Pregnant females who do not have evidence of immunity should receive the first dose after pregnancy. This first dose should be obtained before leaving the health care facility. The second dose should be obtained 4 8 weeks after the first dose.  Human papillomavirus (HPV) vaccine. Females aged 13 26  years who have not received the vaccine previously should obtain the 3-dose series. The vaccine is not recommended for use in pregnant females. However, pregnancy testing is not needed before receiving a dose. If a female is found to be pregnant after receiving a dose, no treatment is needed. In that case, the remaining doses should be delayed until after the pregnancy. Immunization is recommended for any person with an immunocompromised condition through the age of 26 years if she did not get any or all doses earlier. During the 3-dose series, the second dose should be obtained 4 8 weeks after the first dose. The third dose should be obtained 24 weeks after the first dose and 16 weeks after the second dose.  Zoster vaccine. One dose is recommended for adults aged 60 years or older unless certain conditions are present.  Measles, mumps, and rubella (MMR) vaccine. Adults born before 1957 generally are considered immune to measles and mumps. Adults born in 1957 or later should have 1 or more doses of MMR vaccine unless there is a contraindication to the vaccine or there is laboratory evidence of immunity to each of the three diseases. A routine second dose of MMR vaccine should be obtained at least 28 days after the first dose for students attending postsecondary schools, health care workers, or international travelers. People who received inactivated measles vaccine or an unknown type of measles vaccine during 1963 1967 should receive 2 doses of MMR vaccine. People who received inactivated mumps vaccine or an unknown type of mumps vaccine before 1979 and are at high risk for mumps infection should consider immunization with 2 doses of   MMR vaccine. For females of childbearing age, rubella immunity should be determined. If there is no evidence of immunity, females who are not pregnant should be vaccinated. If there is no evidence of immunity, females who are pregnant should delay immunization until after pregnancy.  Unvaccinated health care workers born before [birth year] who lack laboratory evidence of measles, mumps, or rubella immunity or laboratory confirmation of disease should consider measles and mumps immunization with 2 doses of MMR vaccine or rubella immunization with 1 dose of MMR vaccine.  Pneumococcal 13-valent conjugate (PCV13) vaccine. When indicated, a person who is uncertain of her immunization history and has no record of immunization should receive the PCV13 vaccine. An adult aged 54 years or older who has certain medical conditions and has not been previously immunized should receive 1 dose of PCV13 vaccine. This PCV13 should be followed with a dose of pneumococcal polysaccharide (PPSV23) vaccine. The PPSV23 vaccine dose should be obtained at least 8 weeks after the dose of PCV13 vaccine. An adult aged 58 years or older who has certain medical conditions and previously received 1 or more doses of PPSV23 vaccine should receive 1 dose of PCV13. The PCV13 vaccine dose should be obtained 1 or more years after the last PPSV23 vaccine dose.  Pneumococcal polysaccharide (PPSV23) vaccine. When PCV13 is also indicated, PCV13 should be obtained first. All adults aged 58 years and older should be immunized. An adult younger than age 65 years who has certain medical conditions should be immunized. Any person who resides in a nursing home or long-term care facility should be immunized. An adult smoker should be immunized. People with an immunocompromised condition and certain other conditions should receive both PCV13 and PPSV23 vaccines. People with human immunodeficiency virus (HIV) infection should be immunized as soon as possible after diagnosis. Immunization during chemotherapy or radiation therapy should be avoided. Routine use of PPSV23 vaccine is not recommended for American Indians, [place] Natives, or people younger than 65 years unless there are medical conditions that require PPSV23 vaccine. When indicated,  people who have unknown immunization and have no record of immunization should receive PPSV23 vaccine. One-time revaccination 5 years after the first dose of PPSV23 is recommended for people aged 70 64 years who have chronic kidney failure, nephrotic syndrome, asplenia, or immunocompromised conditions. People who received 1 2 doses of PPSV23 before age 32 years should receive another dose of PPSV23 vaccine at age 96 years or later if at least 5 years have passed since the previous dose. Doses of PPSV23 are not needed for people immunized with PPSV23 at or after age 55 years.  Meningococcal vaccine. Adults with asplenia or persistent complement component deficiencies should receive 2 doses of quadrivalent meningococcal conjugate (MenACWY-D) vaccine. The doses should be obtained at least 2 months apart. Microbiologists working with certain meningococcal bacteria, [employer] recruits, people at risk during an outbreak, and people who travel to or live in countries with a high rate of meningitis should be immunized. A first-year college student up through age 58 years who is living in a residence hall should receive a dose if she did not receive a dose on or after her 16th birthday. Adults who have certain high-risk conditions should receive one or more doses of vaccine.  Hepatitis A vaccine. Adults who wish to be protected from this disease, have certain high-risk conditions, work with hepatitis A-infected animals, work in hepatitis A research labs, or travel to or work in countries with a high rate of hepatitis A should be  immunized. Adults who were previously unvaccinated and who anticipate close contact with an international adoptee during the first 60 days after arrival in the [place] from a country with a high rate of hepatitis A should be immunized.  Hepatitis B vaccine.  Adults who wish to be protected from this disease, have certain high-risk conditions, may be exposed to blood or other infectious  body fluids, are household contacts or sex partners of hepatitis B positive people, are clients or workers in certain care facilities, or travel to or work in countries with a high rate of hepatitis B should be immunized.  Haemophilus influenzae type b (Hib) vaccine. A previously unvaccinated person with asplenia or sickle cell disease or having a scheduled splenectomy should receive 1 dose of Hib vaccine. Regardless of previous immunization, a recipient of a hematopoietic stem cell transplant should receive a 3-dose series 6 12 months after her successful transplant. Hib vaccine is not recommended for adults with HIV infection.  Preventive Services / Frequency Ages 6 to 39years  Blood pressure check.** / Every 1 to 2 years.  Lipid and cholesterol check.** / Every 5 years beginning at age 39.  Clinical breast exam.** / Every 3 years for women in their 61s and 62s.  BRCA-related cancer risk assessment.** / For women who have family members with a BRCA-related cancer (breast, ovarian, tubal, or peritoneal cancers).  Pap test.** / Every 2 years from ages 47 through 85. Every 3 years starting at age 34 through age 12 or 74 with a history of 3 consecutive normal Pap tests.  HPV screening.** / Every 3 years from ages 46 through ages 43 to 54 with a history of 3 consecutive normal Pap tests.  Hepatitis C blood test.** / For any individual with known risks for hepatitis C.  Skin self-exam. / Monthly.  Influenza vaccine. / Every year.  Tetanus, diphtheria, and acellular pertussis (Tdap, Td) vaccine.** / Consult your health care provider. Pregnant women should receive 1 dose of Tdap vaccine during each pregnancy. 1 dose of Td every 10 years.  Varicella vaccine.** / Consult your health care provider. Pregnant females who do not have evidence of immunity should receive the first dose after pregnancy.  HPV vaccine. / 3 doses over 6 months, if 64 and younger. The vaccine is not recommended for use in  pregnant females. However, pregnancy testing is not needed before receiving a dose.  Measles, mumps, rubella (MMR) vaccine.** / You need at least 1 dose of MMR if you were born in 1957 or later. You may also need a 2nd dose. For females of childbearing age, rubella immunity should be determined. If there is no evidence of immunity, females who are not pregnant should be vaccinated. If there is no evidence of immunity, females who are pregnant should delay immunization until after pregnancy.  Pneumococcal 13-valent conjugate (PCV13) vaccine.** / Consult your health care provider.  Pneumococcal polysaccharide (PPSV23) vaccine.** / 1 to 2 doses if you smoke cigarettes or if you have certain conditions.  Meningococcal vaccine.** / 1 dose if you are age 71 to 37 years and a Market researcher living in a residence hall, or have one of several medical conditions, you need to get vaccinated against meningococcal disease. You may also need additional booster doses.  Hepatitis A vaccine.** / Consult your health care provider.  Hepatitis B vaccine.** / Consult your health care provider.  Haemophilus influenzae type b (Hib) vaccine.** / Consult your health care provider.  Ages 55 to 64years  Blood pressure check.** / Every 1 to 2 years.  Lipid and cholesterol check.** / Every 5 years beginning at age 20 years.  Lung cancer screening. / Every year if you are aged 55 80 years and have a 30-pack-year history of smoking and currently smoke or have quit within the past 15 years. Yearly screening is stopped once you have quit smoking for at least 15 years or develop a health problem that would prevent you from having lung cancer treatment.  Clinical breast exam.** / Every year after age 40 years.  BRCA-related cancer risk assessment.** / For women who have family members with a BRCA-related cancer (breast, ovarian, tubal, or peritoneal cancers).  Mammogram.** / Every year beginning at age 40  years and continuing for as long as you are in good health. Consult with your health care provider.  Pap test.** / Every 3 years starting at age 30 years through age 65 or 70 years with a history of 3 consecutive normal Pap tests.  HPV screening.** / Every 3 years from ages 30 years through ages 65 to 70 years with a history of 3 consecutive normal Pap tests.  Fecal occult blood test (FOBT) of stool. / Every year beginning at age 50 years and continuing until age 75 years. You may not need to do this test if you get a colonoscopy every 10 years.  Flexible sigmoidoscopy or colonoscopy.** / Every 5 years for a flexible sigmoidoscopy or every 10 years for a colonoscopy beginning at age 50 years and continuing until age 75 years.  Hepatitis C blood test.** / For all people born from 1945 through 1965 and any individual with known risks for hepatitis C.  Skin self-exam. / Monthly.  Influenza vaccine. / Every year.  Tetanus, diphtheria, and acellular pertussis (Tdap/Td) vaccine.** / Consult your health care provider. Pregnant women should receive 1 dose of Tdap vaccine during each pregnancy. 1 dose of Td every 10 years.  Varicella vaccine.** / Consult your health care provider. Pregnant females who do not have evidence of immunity should receive the first dose after pregnancy.  Zoster vaccine.** / 1 dose for adults aged 60 years or older.  Measles, mumps, rubella (MMR) vaccine.** / You need at least 1 dose of MMR if you were born in 1957 or later. You may also need a 2nd dose. For females of childbearing age, rubella immunity should be determined. If there is no evidence of immunity, females who are not pregnant should be vaccinated. If there is no evidence of immunity, females who are pregnant should delay immunization until after pregnancy.  Pneumococcal 13-valent conjugate (PCV13) vaccine.** / Consult your health care provider.  Pneumococcal polysaccharide (PPSV23) vaccine.** / 1 to 2 doses if  you smoke cigarettes or if you have certain conditions.  Meningococcal vaccine.** / Consult your health care provider.  Hepatitis A vaccine.** / Consult your health care provider.  Hepatitis B vaccine.** / Consult your health care provider.  Haemophilus influenzae type b (Hib) vaccine.** / Consult your health care provider.  Ages 65 years and over  Blood pressure check.** / Every 1 to 2 years.  Lipid and cholesterol check.** / Every 5 years beginning at age 20 years.  Lung cancer screening. / Every year if you are aged 55 80 years and have a 30-pack-year history of smoking and currently smoke or have quit within the past 15 years. Yearly screening is stopped once you have quit smoking for at least 15 years or develop a health problem that   would prevent you from having lung cancer treatment.  Clinical breast exam.** / Every year after age 103 years.  BRCA-related cancer risk assessment.** / For women who have family members with a BRCA-related cancer (breast, ovarian, tubal, or peritoneal cancers).  Mammogram.** / Every year beginning at age 36 years and continuing for as long as you are in good health. Consult with your health care provider.  Pap test.** / Every 3 years starting at age 5 years through age 85 or 10 years with 3 consecutive normal Pap tests. Testing can be stopped between 65 and 70 years with 3 consecutive normal Pap tests and no abnormal Pap or HPV tests in the past 10 years.  HPV screening.** / Every 3 years from ages 93 years through ages 70 or 45 years with a history of 3 consecutive normal Pap tests. Testing can be stopped between 65 and 70 years with 3 consecutive normal Pap tests and no abnormal Pap or HPV tests in the past 10 years.  Fecal occult blood test (FOBT) of stool. / Every year beginning at age 8 years and continuing until age 45 years. You may not need to do this test if you get a colonoscopy every 10 years.  Flexible sigmoidoscopy or colonoscopy.** /  Every 5 years for a flexible sigmoidoscopy or every 10 years for a colonoscopy beginning at age 69 years and continuing until age 68 years.  Hepatitis C blood test.** / For all people born from [birth year] through 1965 and any individual with known risks for hepatitis C.  Osteoporosis screening.** / A one-time screening for women ages 7 years and over and women at risk for fractures or osteoporosis.  Skin self-exam. / Monthly.  Influenza vaccine. / Every year.  Tetanus, diphtheria, and acellular pertussis (Tdap/Td) vaccine.** / 1 dose of Td every 10 years.  Varicella vaccine.** / Consult your health care provider.  Zoster vaccine.** / 1 dose for adults aged 5 years or older.  Pneumococcal 13-valent conjugate (PCV13) vaccine.** / Consult your health care provider.  Pneumococcal polysaccharide (PPSV23) vaccine.** / 1 dose for all adults aged 74 years and older.  Meningococcal vaccine.** / Consult your health care provider.  Hepatitis A vaccine.** / Consult your health care provider.  Hepatitis B vaccine.** / Consult your health care provider.  Haemophilus influenzae type b (Hib) vaccine.** / Consult your health care provider. ** Family history and personal history of risk and conditions may change your health care provider's recommendations. Document Released: 02/22/2001 Document Revised: 10/17/2012  [facility] Patient Information 2014 [place], [place].   EXERCISE AND DIET:  We recommended that you start or continue a regular exercise program for good health. Regular exercise means any activity that makes your heart beat faster and makes you sweat.  We recommend exercising at least 30 minutes per day at least 3 days a week, preferably 5.  We also recommend a diet low in fat and sugar / carbohydrates.  Inactivity, poor dietary choices and obesity can cause diabetes, heart attack, stroke, and kidney damage, among others.     ALCOHOL AND SMOKING:  Women should limit their alcohol intake to no  more than 7 drinks/beers/glasses of wine (combined, not each!) per week. Moderation of alcohol intake to this level decreases your risk of breast cancer and liver damage.  ( And of course, no recreational drugs are part of a healthy lifestyle.)  Also, you should not be smoking at all or even being exposed to second hand smoke. Most people know smoking can  cause cancer, and various heart and lung diseases, but did you know it also contributes to weakening of your bones?  Aging of your skin?  Yellowing of your teeth and nails?   CALCIUM AND VITAMIN D:  Adequate intake of calcium and Vitamin D are recommended.  The recommendations for exact amounts of these supplements seem to change often, but generally speaking 600 mg of calcium (either carbonate or citrate) and 800 units of Vitamin D per day seems prudent. Certain women may benefit from higher intake of Vitamin D.  If you are among these women, your doctor will have told you during your visit.     PAP SMEARS:  Pap smears, to check for cervical cancer or precancers,  have traditionally been done yearly, although recent scientific advances have shown that most women can have pap smears less often.  However, every woman still should have a physical exam from her gynecologist or primary care physician every year. It will include a breast check, inspection of the vulva and vagina to check for abnormal growths or skin changes, a visual exam of the cervix, and then an exam to evaluate the size and shape of the uterus and ovaries.  And after 62 years of age, a rectal exam is indicated to check for rectal cancers. We will also provide age appropriate advice regarding health maintenance, like when you should have certain vaccines, screening for sexually transmitted diseases, bone density testing, colonoscopy, mammograms, etc.    MAMMOGRAMS:  All women over 71 years old should have a yearly mammogram. Many facilities now offer a "3D" mammogram, which may cost  around $50 extra out of pocket. If possible,  we recommend you accept the option to have the 3D mammogram performed.  It both reduces the number of women who will be called back for extra views which then turn out to be normal, and it is better than the routine mammogram at detecting truly abnormal areas.     COLONOSCOPY:  Colonoscopy to screen for colon cancer is recommended for all women at age 52.  We know, you hate the idea of the prep.  We agree, BUT, having colon cancer and not knowing it is worse!!  Colon cancer so often starts as a polyp that can be seen and removed at colonscopy, which can quite literally save your life!  And if your first colonoscopy is normal and you have no family history of colon cancer, most women don't have to have it again for 10 years.  Once every ten years, you can do something that may end up saving your life, right?  We will be happy to help you get it scheduled when you are ready.  Be sure to check your insurance coverage so you understand how much it will cost.  It may be covered as a preventative service at no cost, but you should check your particular policy.

## 2017-11-23 NOTE — Progress Notes (Signed)
Impression and Recommendations:    1. Encounter for wellness examination   2. Health education/counseling   3. Family history of breast cancer in Mother   74. Obesity, Class I, BMI 30-34.9   5. Prediabetes   6. Elevated TSH   7. Vitamin D insufficiency   8. Chronic low back pain, unspecified back pain laterality, unspecified whether sciatica present   9. Spinal stenosis, unspecified spinal region   10. Arthritis of facet joints at multiple vertebral levels   11. Radiculopathy, lumbar region   12. Lumbar discogenic pain syndrome   13. Screening for colon cancer     1) Anticipatory Guidance: Discussed importance of wearing a seatbelt while driving, not texting while driving; sunscreen when outside along with yearly skin surveillance; eating a well balanced and modest diet; physical activity at least 25 minutes per day or 150 min/ week of moderate to intense activity.  - Reviewed importance of regular self-breast exams with patient in office today.  2) Immunizations / Screenings / Labs: All immunizations and screenings that patient agrees to, are up-to-date per recommendations or will be updated today.  Patient understands the needs for q 63mo dental and yearly vision screens which pt will schedule independently. Obtain CBC, CMP, HgA1c, Lipid panel, TSH and vit D when fasting if not already done recently.   - Last mammogram October 2019.  Patient UTD and understands importance of remaining up to date.  Yearly screenings emphasized and recommended.  - Last pap smear in 2018, normal, no HPV detected.  Patient UTD and understands importance of remaining up to date.  Recommendations reviewed and emphasized..  - Overdue for Colonoscopy - Last colonoscopy in 2010 with 5 year follow-up, but patient has declined repeat.  Per patient, had a bad experience with her last colonoscopy; states she woke up in the middle of the procedure, had to be placed back under, and then they had a hard time  waking her up.  - Discussed importance of scheduling follow-up with GI specialist to discuss her concerns about a repeat colonoscopy.  - Need for DEXA at age 23.   Patient not underweight.  Denies broken bones, family history of osteoporosis, or other increased risk factors reviewed with patient today.  3) Weight: Discussed goal of losing even 5-10% of current body weight which would improve overall feelings of well being and improve objective health data significantly.   Improve nutrient density of diet through increasing intake of fruits and vegetables and decreasing saturated/trans fats, white flour products and refined sugar products.   4) BMI Counseling - BMI 34.4: Explained to patient what BMI refers to, and what it means medically.    Told patient to think about it as a "medical risk stratification measurement" and how increasing BMI is associated with increasing risk/ or worsening state of various diseases such as hypertension, hyperlipidemia, diabetes, premature OA, depression etc.  American Heart Association guidelines for healthy diet, basically Mediterranean diet, and exercise guidelines of 30 minutes 5 days per week or more discussed in detail.  Health counseling performed.  All questions answered.  5) Lifestyle & Preventative Health Maintenance: - Advised patient to continue working toward exercising to improve overall mental, physical, and emotional health.    - Encouraged patient to engage in daily physical activity, especially a formal exercise routine.  Recommended that the patient eventually strive for at least 150 minutes of moderate cardiovascular activity per week according to guidelines established by the [facility].   - Healthy dietary habits  encouraged, including low-carb, and high amounts of fiber and lean protein in diet.   - Patient should also consume adequate amounts of water.  6) Follow-Up: - Prescriptions refilled today PRN. - Need for lab work today.  Patient will  return for OV to review as needed. - Otherwise, continue to return for CPE and chronic follow-up as scheduled.   - Patient knows to call in if desired to address acute concerns.    Meds ordered this encounter  Medications  . pregabalin (LYRICA) 75 MG capsule    Sig: Take 1 capsule (75 mg total) by mouth 2 (two) times daily.    Dispense:  60 capsule    Refill:  3    Orders Placed This Encounter  Procedures  . Ambulatory referral to Gastroenterology    Gross side effects, risk and benefits, and alternatives of medications discussed with patient.  Patient is aware that all medications have potential side effects and we are unable to predict every side effect or drug-drug interaction that may occur.  Expresses verbal understanding and consents to current therapy plan and treatment regimen.  F-up preventative CPE in 1 year. F/up sooner for chronic care management as discussed and/or prn.  Please see orders placed and AVS handed out to patient at the end of our visit for further patient instructions/ counseling done pertaining to today's office visit.  This document serves as a record of services personally performed by Mellody Dance, DO. It was created on her behalf by Toni Amend, a trained medical scribe. The creation of this record is based on the scribe's personal observations and the provider's statements to them.   I have reviewed the above medical documentation for accuracy and completeness and I concur.  Mellody Dance, DO, D.O. 11/24/2017 9:01 AM    Subjective:    Chief Complaint  Patient presents with  . Annual Exam    HPI: Paula Conway is a 62 y.o. female who presents to [facility] today a yearly health maintenance exam.  Health Maintenance Summary Reviewed and updated, unless pt declines services.  Colonoscopy:   Last colonoscopy in 2010 at [facility], but does not want a repeat.  Per patient, had a bad  experience with her last colonoscopy; states she woke up in the middle of the procedure, had to be placed back under, and then they had a hard time waking her up.  Denies family history of colon cancer. Tobacco History Reviewed:   Y; former smoker, quit in 2977, 0.75 pack-years. Alcohol:   No concerns, no excessive use Exercise Habits:   Not meeting AHA guidelines. STD concerns:   None. Drug Use:   None. Birth control method:   N/a. Menses regular:   Post-menopausal. Last pap smear normal, no HPV detected 2018. Lumps or breast concerns:  Last mammogram October 2019. Breast Cancer Family History:  Mom was diagnosed at age 23 with breast cancer and died at age 25.  Need for DEXA at age 88.  Patient not underweight.  Denies broken bones, family history of osteoporosis, or other increased risk factors.  OBGYN Patient will have her next pap smear at age 30. Denies family history of ovarian or cervical cancer.  Visual Health Last eye examination was a month ago.  Next Tuesday, has them dilated, contacts renewed, and floater examined.  Dental Health Has not had a dental exam in years.  Dermatological Health Has not been to dermatology in years.  States she had  a few spots taken off, and dermatology told her to "come back if you need me."  Denials Denies problems with swallowing, chest pain, SOB.  Denies food intolerance, constipation, diarrhea, or other problems with GI.  Notes she does not drink enough water.  Patient continues on medications as prescribed.   Immunization History  Administered Date(s) Administered  . Tdap 10/28/2011    Health Maintenance  Topic Date Due  . INFLUENZA VACCINE  12/11/2017 (Originally 08/10/2017)  . COLONOSCOPY  06/20/2018  . PAP SMEAR  08/31/2019  . MAMMOGRAM  10/25/2019  . TETANUS/TDAP  10/27/2021  . Hepatitis C Screening  Completed  . HIV Screening  Completed     Wt Readings from Last 3 Encounters:  11/23/17 200 lb 6.4 oz (90.9 kg)    07/24/17 200 lb 14.4 oz (91.1 kg)  03/15/17 203 lb (92.1 kg)   BP Readings from Last 3 Encounters:  11/23/17 126/82  07/24/17 120/78  03/15/17 123/83   Pulse Readings from Last 3 Encounters:  11/23/17 69  07/24/17 62  03/15/17 72     Past Medical History:  Diagnosis Date  . Allergy   . Arthritis   . Environmental and seasonal allergies 07/20/2015  . Family history of breast cancer in Mother 07/20/2015   Mother was diagnosed at 75 years old and died age 5 of breast cancer   . GERD (gastroesophageal reflux disease)   . Hyperlipidemia   . Obesity 07/20/2015  . Spinal stenosis       Past Surgical History:  Procedure Laterality Date  . APPENDECTOMY    . BREAST BIOPSY    . core biopsy        Family History  Problem Relation Age of Onset  . Cancer Mother        breast  . Breast cancer Mother   . Diabetes Father   . Hypertension Father   . Heart disease Father       Social History   Substance and Sexual Activity  Drug Use No  ,   Social History   Substance and Sexual Activity  Alcohol Use Yes   Comment: socially  ,   Social History   Tobacco Use  Smoking Status Former Smoker  . Packs/day: 0.25  . Years: 3.00  . Pack years: 0.75  . Last attempt to quit: 01/11/1975  . Years since quitting: 42.8  Smokeless Tobacco Never Used  ,   Social History   Substance and Sexual Activity  Sexual Activity Yes    Current Outpatient Medications on File Prior to Visit  Medication Sig Dispense Refill  . Ascorbic Acid (VITAMIN C) 1000 MG tablet Take 1,000 mg by mouth daily.    . Cholecalciferol (VITAMIN D3) 5000 units CAPS Take 5,000 Units by mouth daily.    . fluticasone (FLONASE) 50 MCG/ACT nasal spray 1 spray each nostril twice daily after sterile sinus rinses with AYR/neil med 1 g 5  . Glucos-Chond-Hyal Ac-Ca Fructo (MOVE FREE JOINT HEALTH ADVANCE PO) Take 1 tablet by mouth daily.    . Magnesium 250 MG TABS Take 1 tablet by mouth daily.    . Probiotic  Product (PROBIOTIC PO) Take 1 tablet by mouth daily.     No current facility-administered medications on file prior to visit.     Allergies: Patient has no known allergies.  Review of Systems: General:   Denies fever, chills, unexplained weight loss.  Optho/Auditory:   Denies visual changes, blurred vision/LOV Respiratory:   Denies SOB, DOE more  than baseline levels.  Cardiovascular:   Denies chest pain, palpitations, new onset peripheral edema  Gastrointestinal:   Denies nausea, vomiting, diarrhea.  Genitourinary: Denies dysuria, freq/ urgency, flank pain or discharge from genitals.  Endocrine:     Denies hot or cold intolerance, polyuria, polydipsia. Musculoskeletal:   Denies unexplained myalgias, joint swelling, unexplained arthralgias, gait problems.  Skin:  Denies rash, suspicious lesions Neurological:     Denies dizziness, unexplained weakness, numbness  Psychiatric/Behavioral:   Denies mood changes, suicidal or homicidal ideations, hallucinations    Objective:    Blood pressure 126/82, pulse 69, temperature 97.7 F (36.5 C), height 5\' 4"  (1.626 m), weight 200 lb 6.4 oz (90.9 kg), SpO2 100 %. Body mass index is 34.4 kg/m. General Appearance:    Alert, cooperative, no distress, appears stated age  Head:    Normocephalic, without obvious abnormality, atraumatic  Eyes:    PERRL, conjunctiva/corneas clear, EOM's intact, fundi    benign, both eyes  Ears:    Normal TM's and external ear canals, both ears  Nose:   Nares normal, septum midline, mucosa normal, no drainage    or sinus tenderness  Throat:   Lips w/o lesion, mucosa moist, and tongue normal; teeth and   gums normal  Neck:   Supple, symmetrical, trachea midline, no adenopathy;    thyroid:  no enlargement/tenderness/nodules; no carotid   bruit or JVD  Back:     Symmetric, no curvature, ROM normal, no CVA tenderness  Lungs:     Clear to auscultation bilaterally, respirations unlabored, no       Wh/ R/ R  Chest Wall:     No tenderness or gross deformity; normal excursion   Heart:    Regular rate and rhythm, S1 and S2 normal, no murmur, rub   or gallop  Breast Exam:    Deferred.  Abdomen:     Soft, non-tender, bowel sounds active all four quadrants, NO   G/R/R, no masses, no organomegaly  Genitalia:   Deferred.  Rectal:    Deferred.  Extremities:   Extremities normal, atraumatic, no cyanosis or gross edema  Pulses:   2+ and symmetric all extremities  Skin:   Warm, dry, Skin color, texture, turgor normal, no obvious rashes or lesions Psych: No HI/SI, judgement and insight good, Euthymic mood. Full Affect.  Neurologic:   CNII-XII intact, normal strength, sensation and reflexes    Throughout

## 2018-01-24 ENCOUNTER — Other Ambulatory Visit: Payer: Self-pay

## 2018-01-24 DIAGNOSIS — E785 Hyperlipidemia, unspecified: Secondary | ICD-10-CM

## 2018-01-24 DIAGNOSIS — R7989 Other specified abnormal findings of blood chemistry: Secondary | ICD-10-CM

## 2018-01-24 DIAGNOSIS — R7303 Prediabetes: Secondary | ICD-10-CM

## 2018-01-24 DIAGNOSIS — Z0000 Encounter for general adult medical examination without abnormal findings: Secondary | ICD-10-CM

## 2018-01-24 DIAGNOSIS — R7302 Impaired glucose tolerance (oral): Secondary | ICD-10-CM

## 2018-01-24 DIAGNOSIS — E559 Vitamin D deficiency, unspecified: Secondary | ICD-10-CM

## 2018-01-26 ENCOUNTER — Other Ambulatory Visit: Payer: BC Managed Care – PPO

## 2018-01-26 DIAGNOSIS — E785 Hyperlipidemia, unspecified: Secondary | ICD-10-CM

## 2018-01-26 DIAGNOSIS — R7303 Prediabetes: Secondary | ICD-10-CM

## 2018-01-26 DIAGNOSIS — R7302 Impaired glucose tolerance (oral): Secondary | ICD-10-CM

## 2018-01-26 DIAGNOSIS — R7989 Other specified abnormal findings of blood chemistry: Secondary | ICD-10-CM

## 2018-01-26 DIAGNOSIS — E559 Vitamin D deficiency, unspecified: Secondary | ICD-10-CM

## 2018-01-26 DIAGNOSIS — Z0000 Encounter for general adult medical examination without abnormal findings: Secondary | ICD-10-CM

## 2018-01-27 LAB — COMPREHENSIVE METABOLIC PANEL
A/G RATIO: 2 (ref 1.2–2.2)
ALT: 29 IU/L (ref 0–32)
AST: 24 IU/L (ref 0–40)
Albumin: 4.7 g/dL (ref 3.6–4.8)
Alkaline Phosphatase: 77 IU/L (ref 39–117)
BILIRUBIN TOTAL: 0.4 mg/dL (ref 0.0–1.2)
BUN/Creatinine Ratio: 16 (ref 12–28)
BUN: 13 mg/dL (ref 8–27)
CHLORIDE: 108 mmol/L — AB (ref 96–106)
CO2: 20 mmol/L (ref 20–29)
Calcium: 9.8 mg/dL (ref 8.7–10.3)
Creatinine, Ser: 0.8 mg/dL (ref 0.57–1.00)
GFR calc Af Amer: 91 mL/min/{1.73_m2} (ref >59)
GFR calc non Af Amer: 79 mL/min/{1.73_m2} (ref >59)
Globulin, Total: 2.4 g/dL (ref 1.5–4.5)
Glucose: 108 mg/dL — ABNORMAL HIGH (ref 65–99)
Potassium: 4.4 mmol/L (ref 3.5–5.2)
Sodium: 143 mmol/L (ref 134–144)
Total Protein: 7.1 g/dL (ref 6.0–8.5)

## 2018-01-27 LAB — CBC WITH DIFFERENTIAL/PLATELET
Basophils Absolute: 0.1 10*3/uL (ref 0.0–0.2)
Basos: 1 %
EOS (ABSOLUTE): 0.3 10*3/uL (ref 0.0–0.4)
Eos: 5 %
HEMATOCRIT: 40.5 % (ref 34.0–46.6)
Hemoglobin: 13.6 g/dL (ref 11.1–15.9)
Immature Grans (Abs): 0 10*3/uL (ref 0.0–0.1)
Immature Granulocytes: 0 %
Lymphocytes Absolute: 2.2 10*3/uL (ref 0.7–3.1)
Lymphs: 41 %
MCH: 29.5 pg (ref 26.6–33.0)
MCHC: 33.6 g/dL (ref 31.5–35.7)
MCV: 88 fL (ref 79–97)
Monocytes Absolute: 0.5 10*3/uL (ref 0.1–0.9)
Monocytes: 8 %
Neutrophils Absolute: 2.4 10*3/uL (ref 1.4–7.0)
Neutrophils: 45 %
Platelets: 241 10*3/uL (ref 150–450)
RBC: 4.61 x10E6/uL (ref 3.77–5.28)
RDW: 13 % (ref 11.7–15.4)
WBC: 5.4 10*3/uL (ref 3.4–10.8)

## 2018-01-27 LAB — LIPID PANEL
Chol/HDL Ratio: 4.8 ratio — ABNORMAL HIGH (ref 0.0–4.4)
Cholesterol, Total: 260 mg/dL — ABNORMAL HIGH (ref 100–199)
HDL: 54 mg/dL (ref >39)
LDL Calculated: 169 mg/dL — ABNORMAL HIGH (ref 0–99)
Triglycerides: 185 mg/dL — ABNORMAL HIGH (ref 0–149)
VLDL CHOLESTEROL CAL: 37 mg/dL (ref 5–40)

## 2018-01-27 LAB — VITAMIN D 25 HYDROXY (VIT D DEFICIENCY, FRACTURES): Vit D, 25-Hydroxy: 61.3 ng/mL (ref 30.0–100.0)

## 2018-01-27 LAB — T3, FREE: T3, Free: 2.6 pg/mL (ref 2.0–4.4)

## 2018-01-27 LAB — T4, FREE: Free T4: 1.01 ng/dL (ref 0.82–1.77)

## 2018-01-27 LAB — TSH: TSH: 6.44 u[IU]/mL — ABNORMAL HIGH (ref 0.450–4.500)

## 2018-01-27 LAB — HEMOGLOBIN A1C
Est. average glucose Bld gHb Est-mCnc: 126 mg/dL
HEMOGLOBIN A1C: 6 % — AB (ref 4.8–5.6)

## 2018-02-16 ENCOUNTER — Encounter: Payer: Self-pay | Admitting: Family Medicine

## 2018-03-28 ENCOUNTER — Ambulatory Visit: Payer: BC Managed Care – PPO | Admitting: Family Medicine

## 2018-04-03 ENCOUNTER — Ambulatory Visit: Payer: BC Managed Care – PPO | Admitting: Family Medicine

## 2018-04-10 ENCOUNTER — Ambulatory Visit: Payer: BC Managed Care – PPO | Admitting: Family Medicine

## 2018-08-14 ENCOUNTER — Other Ambulatory Visit: Payer: Self-pay | Admitting: Family Medicine

## 2018-08-14 DIAGNOSIS — M47819 Spondylosis without myelopathy or radiculopathy, site unspecified: Secondary | ICD-10-CM

## 2018-08-14 DIAGNOSIS — M5416 Radiculopathy, lumbar region: Secondary | ICD-10-CM

## 2018-08-14 DIAGNOSIS — M4800 Spinal stenosis, site unspecified: Secondary | ICD-10-CM

## 2018-08-14 DIAGNOSIS — M545 Low back pain, unspecified: Secondary | ICD-10-CM

## 2018-08-14 DIAGNOSIS — M5126 Other intervertebral disc displacement, lumbar region: Secondary | ICD-10-CM

## 2018-08-14 DIAGNOSIS — G8929 Other chronic pain: Secondary | ICD-10-CM

## 2018-08-14 MED ORDER — PREGABALIN 75 MG PO CAPS: 75.0000 mg | ORAL_CAPSULE | Freq: Two times a day (BID) | ORAL | 0 refills | Status: DC

## 2018-08-14 NOTE — Addendum Note (Signed)
Addended by: Lanier Prude D on: 08/14/2018 05:03 PM   Modules accepted: Orders

## 2018-08-14 NOTE — Telephone Encounter (Signed)
LOV 11/23/2017  Patient has appt on 08/27/2018.  Medication last filled on 11/23/17 for  #60 and 3 refills. Please  Review and advise if short supply can be approved until OV. MPulliam, CMA/RT(R)

## 2018-08-14 NOTE — Telephone Encounter (Signed)
Patient is requesting a refill of her Lyrica, she is due for a med f/u and has scheduled one for 08/27/18 but will be out by then. If approved please send to [facility].

## 2018-08-27 ENCOUNTER — Ambulatory Visit: Payer: BC Managed Care – PPO | Admitting: Family Medicine

## 2018-09-25 ENCOUNTER — Encounter: Payer: Self-pay | Admitting: Family Medicine

## 2018-10-02 ENCOUNTER — Ambulatory Visit (INDEPENDENT_AMBULATORY_CARE_PROVIDER_SITE_OTHER): Payer: BC Managed Care – PPO | Admitting: Family Medicine

## 2018-10-02 ENCOUNTER — Encounter: Payer: Self-pay | Admitting: Family Medicine

## 2018-10-02 ENCOUNTER — Other Ambulatory Visit: Payer: Self-pay

## 2018-10-02 VITALS — BP 123/98 | HR 67 | Ht 66.0 in | Wt 193.3 lb

## 2018-10-02 DIAGNOSIS — F430 Acute stress reaction: Secondary | ICD-10-CM

## 2018-10-02 DIAGNOSIS — M5126 Other intervertebral disc displacement, lumbar region: Secondary | ICD-10-CM | POA: Diagnosis not present

## 2018-10-02 DIAGNOSIS — M159 Polyosteoarthritis, unspecified: Secondary | ICD-10-CM

## 2018-10-02 DIAGNOSIS — E785 Hyperlipidemia, unspecified: Secondary | ICD-10-CM

## 2018-10-02 DIAGNOSIS — M5416 Radiculopathy, lumbar region: Secondary | ICD-10-CM

## 2018-10-02 DIAGNOSIS — R7303 Prediabetes: Secondary | ICD-10-CM

## 2018-10-02 DIAGNOSIS — M5136 Other intervertebral disc degeneration, lumbar region with discogenic back pain only: Secondary | ICD-10-CM

## 2018-10-02 DIAGNOSIS — F4321 Adjustment disorder with depressed mood: Secondary | ICD-10-CM

## 2018-10-02 DIAGNOSIS — F4320 Adjustment disorder, unspecified: Secondary | ICD-10-CM | POA: Insufficient documentation

## 2018-10-02 MED ORDER — PREGABALIN 75 MG PO CAPS: 75.0000 mg | ORAL_CAPSULE | Freq: Two times a day (BID) | ORAL | 1 refills | Status: DC

## 2018-10-02 MED ORDER — DULOXETINE HCL 20 MG PO CPEP: 20.0000 mg | ORAL_CAPSULE | Freq: Every day | ORAL | 1 refills | Status: DC

## 2018-10-02 NOTE — Progress Notes (Signed)
Virtual / live video office visit note for [employer], D.O- Primary Care Physician at [facility]   I connected with current patient today and beyond visually recognizing the correct individual, I verified that I am speaking with the correct person using two identifiers.  . Location of the patient: Home . Location of the provider: Office Only the patient (+/- their family members at pt's discretion) and myself were participating in the encounter    - This visit type was conducted due to national recommendations for restrictions regarding the COVID-19 Pandemic (e.g. social distancing) in an effort to limit this patient's exposure and mitigate transmission in our community.  This format is felt to be most appropriate for this patient at this time.   - The patient did have access to video technology today yet, we had technical difficulties with this method, requiring transitioning to audio only.    - No physical exam could be performed with this format, beyond that communicated to [name] by the patient/ family members as noted.   - Additionally my office staff/ schedulers discussed with the patient that there may be a monetary charge related to this service, depending on patient's medical insurance.   The patient expressed understanding, and agreed to proceed.      History of Present Illness:  - Pt called in asking for Lyrica recently and was told she must come in for f/up for refills.  When asked how she's doing today, states "I'm here."  Notes it's been a rough couple months.  She was supposed to come in for f/up on her back and vertigo, and notes her appointment kept being cancelled for several reasons, including COVID.  - Mood Notes Clair Gulling passed on 09-03-2018.  Since then, states "I have my moments."    Says she has a meltdown whenever she talks about him.  Explains that he was doing so well with his health goals before he died; his sugars were getting under control, he was  eating less sweets (which really surprised her).  Pt states that he told her he didn't feel good the night he died, and finally asked her to call 911 at 10:14 PM, and the death certificate was timed a little after 11.  Notes she continues interacting with others.  Says she has a hard time talking about it, but when she does, she tries to calm herself.  When she talks about it, she does cry.  Says "I'm handling it.  If it gets to the point where I can't, I will call and ask you for something."  Notes retired from job when she moved to [place].  Says she finally got her kids to go home.  "They call me every night."  - Sleep Patient states she is sleeping.  Says "I cry myself to sleep, and I have had more conversations with God than I've had in my entire life."  Says this past Sunday night, she did not cry herself to sleep.  Says "I know he bought two bottles of the melatonin to help him sleep; I've got that."  Notes also uses her oils at night, and has added a serenity oil into her routine.    - Back Pain "My back is the only thing that's bothering me."  Says "right now, my back is bothering me, and the white noise in my ears is bothering me.  I try to prove to my kids that I can handle things and I  can do it, but now my back is killing me, and I don't have anymore of the Lyrica which did help."  Says she "cannot stand straight up," because the pain is in her hip and "kinda goes down my leg."  Notes feels fine while sitting.    She still does exercises given to her by physical therapy, from referral back in November.  She can tolerate some exercises better than others.  Notes not exercising because she can't stand up straight.  "I had decided I was going to walk, but after I mowed the lawn, I couldn't walk after that."    Depression screen [facility] 2/9 11/23/2017 07/24/2017 03/15/2017 02/15/2017 08/30/2016  Decreased Interest 0 0 0 0 0  Down, Depressed, Hopeless 0 0 0 0 0  PHQ - 2 Score 0 0 0 0 0   Altered sleeping 1 1 1 1  -  Tired, decreased energy 1 0 0 1 -  Change in appetite 0 0 0 0 -  Feeling bad or failure about yourself  0 0 0 0 -  Trouble concentrating 0 0 0 0 -  Moving slowly or fidgety/restless 0 0 0 0 -  Suicidal thoughts 0 0 0 0 -  PHQ-9 Score 2 1 1 2  -  Difficult doing work/chores Not difficult at all Not difficult at all Not difficult at all Not difficult at all -    Impression and Recommendations:    1. Lumbar discogenic pain syndrome   2. Radiculopathy, lumbar region   3. Generalized OA   4. Prediabetes   5. Hyperlipidemia, unspecified hyperlipidemia type   6. Grief reaction- loss of husband [name] Aug 8th, 2020   7. Reaction, situational, acute, to stress    - Last labs drawn on 01/26/2018  Grief Reaction - Loss of husband [name] Aug 8th 2020 - Advised patient that if her grief is interfering with her quality of life, we recommend beginning prescription intervention.  - In addition to prescription intervention, reviewed the "spokes of the wheel" of mood and health management.  Stressed the importance of ongoing prudent habits, including regular exercise, appropriate sleep hygiene, healthful dietary habits, and prayer/meditation to relax.  - Will continue to monitor.  Chronic Pain - Lumbar Discogenic Pain Syndrome, Lumbar Radiculopathy - Advised beginning Cymbalta today for both chronic pain and mood management. - Extensive education provided today and all questions answered.  - Patient agrees to begin Cymbalta.  See med list. - Patient knows to call in PRN with concerns starting Cymbalta.  - Refill of Lyrica provided today.  See med list.  - Encouraged patient to continue exercises provided by PT. - For 10 minutes each day, encouraged patient to begin prudent exercise. - Advised walking on flat, forgiving ground, and slowly increasing activity over time. - Avoid twisting, lifting, or walking up and down hills.  - Will continue to monitor.   Hyperlipidemia - Cholesterol elevated over 7 months ago; pt was told to keep an eye on it and re-check in 6 months.  Need for FLP in near future. - Will continue to monitor.  Prediabetes - Need for A1c re-check. - Will continue to monitor.  BMI Counseling - Body mass index is 31.2 kg/m Explained to patient what BMI refers to, and what it means medically.    Told patient to think about it as a "medical risk stratification measurement" and how increasing BMI is associated with increasing risk/ or worsening state of various diseases such as hypertension, hyperlipidemia, diabetes, premature OA,  depression etc.  American Heart Association guidelines for healthy diet, basically Mediterranean diet, and exercise guidelines of 30 minutes 5 days per week or more discussed in detail.  Health counseling performed.  All questions answered.  Lifestyle & Preventative Health Maintenance - Advised patient to continue working toward exercising to improve overall mental, physical, and emotional health.  - Encouraged patient to engage in daily physical activity, especially a formal exercise routine.  Recommended that the patient eventually strive for at least 150 minutes of moderate cardiovascular activity per week according to guidelines established by the [facility].   - Healthy dietary habits encouraged, including low-carb, and high amounts of lean protein in diet.   - Patient should also consume adequate amounts of water.  Recommendations - Return in 6-8 weeks to evaluate starting Cymbalta. - Need for cholesterol and A1c re-check in near future. - Otherwise, return for CPE and chronic maintenance as scheduled. - Patient knows to reach out sooner PRN for acute concerns.  - As part of my medical decision making, I reviewed the following data within the [place] History obtained from pt /family, CMA notes reviewed and incorporated if applicable, Labs reviewed, Radiograph/ tests reviewed if  applicable and OV notes from prior OV's with me, as well as other specialists she/he has seen since seeing me last, were all reviewed and used in my medical decision making process today.   - Additionally, discussion had with patient regarding txmnt plan, their biases about that plan etc were used in my medical decision making today.   - The patient agreed with the plan and demonstrated an understanding of the instructions.   No barriers to understanding were identified.   - Red flag symptoms and signs discussed in detail.  Patient expressed understanding regarding what to do in case of emergency\ urgent symptoms.  The patient was advised to call back or seek an in-person evaluation if the symptoms worsen or if the condition fails to improve as anticipated.   Return for 6 wks or so- FBW lab only and then OV 3d later- started cymbalta, go over labs.    Orders Placed This Encounter  Procedures  . Lipid panel  . Hemoglobin A1c    Meds ordered this encounter  Medications  . DULoxetine (CYMBALTA) 20 MG capsule    Sig: Take 1 capsule (20 mg total) by mouth daily.    Dispense:  90 capsule    Refill:  1  . pregabalin (LYRICA) 75 MG capsule    Sig: Take 1 capsule (75 mg total) by mouth 2 (two) times daily.    Dispense:  180 capsule    Refill:  1    Medications Discontinued During This Encounter  Medication Reason  . pregabalin (LYRICA) 75 MG capsule Error      I provided 22 minutes of face-to-face time during this encounter.  Additional time was spent with charting and coordination of care after the actual visit commenced.   Note:  This note was prepared with assistance of Dragon voice recognition software. Occasional wrong-word or sound-a-like substitutions may have occurred due to the inherent limitations of voice recognition software.  This document serves as a record of services personally performed by Mellody Dance, DO. It was created on her behalf by Toni Amend, a trained  medical scribe. The creation of this record is based on the scribe's personal observations and the provider's statements to them.   I have reviewed the above medical documentation for accuracy and completeness and I  concur.  Mellody Dance, DO 10/06/2018 2:47 PM        Patient Care Team    Relationship Specialty Notifications Start End  Mellody Dance, DO PCP - General Family Medicine  07/16/15     -Vitals obtained; medications/ allergies reconciled;  personal medical, social, Sx etc.histories were updated by CMA, reviewed by me and are reflected in chart  Patient Active Problem List   Diagnosis Date Noted  . Obesity, Class I, BMI 30-34.9 08/30/2016    Priority: High  . Prediabetes 07/30/2015    Priority: High  . Elevated LDL cholesterol level 07/30/2015    Priority: High  . Hyperlipidemia 07/20/2015    Priority: High  . Family history of breast cancer in Mother 07/20/2015    Priority: Medium  . GERD (gastroesophageal reflux disease) 07/20/2015    Priority: Medium  . Generalized OA 07/20/2015    Priority: Low  . Environmental and seasonal allergies 07/20/2015    Priority: Low  . Spinal stenosis: Chronic back pain  07/20/2015    Priority: Low  . Grief reaction- loss of husband [name] Aug 8th, 2020 10/02/2018  . Reaction, situational, acute, to stress 10/02/2018  . Dysfunction of both eustachian tubes 07/24/2017  . BPPV (benign paroxysmal positional vertigo), unspecified laterality 07/24/2017  . Vitamin D insufficiency 03/15/2017  . Lumbar discogenic pain syndrome 02/15/2017  . Radiculopathy, lumbar region 02/15/2017  . Arthritis of facet joints at multiple vertebral levels 02/15/2017  . Low back pain 02/15/2017  . Glucose intolerance (impaired glucose tolerance) 09/14/2016  . Health education/counseling 08/30/2016  . Elevated TSH 07/30/2015  . Encounter for screening mammogram for breast cancer 07/16/2015  . Neoplasm of connective tissue 11/23/2011     Current  Meds  Medication Sig  . Ascorbic Acid (VITAMIN C) 1000 MG tablet Take 1,000 mg by mouth daily.  . Cholecalciferol (VITAMIN D3) 5000 units CAPS Take 5,000 Units by mouth daily.  . fluticasone (FLONASE) 50 MCG/ACT nasal spray 1 spray each nostril twice daily after sterile sinus rinses with AYR/neil med  . Glucos-Chond-Hyal Ac-Ca Fructo (MOVE FREE JOINT HEALTH ADVANCE PO) Take 1 tablet by mouth daily.  . Magnesium 250 MG TABS Take 1 tablet by mouth daily.  . Probiotic Product (PROBIOTIC PO) Take 1 tablet by mouth daily.  . Vitamin D, Ergocalciferol, (DRISDOL) 1.25 MG (50000 UT) CAPS capsule TAKE 1 CAPSULE BY MOUTH EVERY 7 DAYS.     No Known Allergies   ROS:  See above HPI for pertinent positives and negatives   Objective:   Blood pressure (!) 123/98, pulse 67, height 5\' 6"  (1.676 m), weight 193 lb 4.8 oz (87.7 kg).  (if some vitals are omitted, this means that patient was UNABLE to obtain them even though they were asked to get them prior to OV today.  They were asked to call us at their earliest convenience with these once obtained.)  General: A & O * 3; visually in no acute distress; in usual state of health.  Skin: Visible skin appears normal and pt's usual skin color HEENT:  EOMI, head is normocephalic and atraumatic.  Sclera are anicteric. Neck has a good range of motion.  Lips are noncyanotic Chest: normal chest excursion and movement Respiratory: speaking in full sentences, no conversational dyspnea; no use of accessory muscles Psych: insight good, mood- appears full

## 2018-11-13 ENCOUNTER — Other Ambulatory Visit: Payer: Self-pay

## 2018-11-13 ENCOUNTER — Other Ambulatory Visit: Payer: BC Managed Care – PPO

## 2018-11-13 DIAGNOSIS — E785 Hyperlipidemia, unspecified: Secondary | ICD-10-CM

## 2018-11-13 DIAGNOSIS — R7303 Prediabetes: Secondary | ICD-10-CM

## 2018-11-14 LAB — HEMOGLOBIN A1C
Est. average glucose Bld gHb Est-mCnc: 123 mg/dL
Hgb A1c MFr Bld: 5.9 % — ABNORMAL HIGH (ref 4.8–5.6)

## 2018-11-14 LAB — LIPID PANEL
Chol/HDL Ratio: 5 ratio — ABNORMAL HIGH (ref 0.0–4.4)
Cholesterol, Total: 247 mg/dL — ABNORMAL HIGH (ref 100–199)
HDL: 49 mg/dL (ref >39)
LDL Chol Calc (NIH): 163 mg/dL — ABNORMAL HIGH (ref 0–99)
Triglycerides: 188 mg/dL — ABNORMAL HIGH (ref 0–149)
VLDL Cholesterol Cal: 35 mg/dL (ref 5–40)

## 2018-11-16 ENCOUNTER — Ambulatory Visit (INDEPENDENT_AMBULATORY_CARE_PROVIDER_SITE_OTHER): Payer: BC Managed Care – PPO | Admitting: Family Medicine

## 2018-11-16 ENCOUNTER — Other Ambulatory Visit: Payer: Self-pay

## 2018-11-16 ENCOUNTER — Encounter: Payer: Self-pay | Admitting: Family Medicine

## 2018-11-16 VITALS — BP 129/83 | HR 71 | Temp 98.4°F | Resp 12 | Ht 66.0 in | Wt 192.1 lb

## 2018-11-16 DIAGNOSIS — M5416 Radiculopathy, lumbar region: Secondary | ICD-10-CM

## 2018-11-16 DIAGNOSIS — E785 Hyperlipidemia, unspecified: Secondary | ICD-10-CM

## 2018-11-16 DIAGNOSIS — R7303 Prediabetes: Secondary | ICD-10-CM

## 2018-11-16 DIAGNOSIS — M5126 Other intervertebral disc displacement, lumbar region: Secondary | ICD-10-CM | POA: Diagnosis not present

## 2018-11-16 DIAGNOSIS — Z5320 Procedure and treatment not carried out because of patient's decision for unspecified reasons: Secondary | ICD-10-CM | POA: Insufficient documentation

## 2018-11-16 DIAGNOSIS — F430 Acute stress reaction: Secondary | ICD-10-CM

## 2018-11-16 DIAGNOSIS — F4321 Adjustment disorder with depressed mood: Secondary | ICD-10-CM | POA: Diagnosis not present

## 2018-11-16 DIAGNOSIS — E781 Pure hyperglyceridemia: Secondary | ICD-10-CM | POA: Insufficient documentation

## 2018-11-16 MED ORDER — ROSUVASTATIN CALCIUM 5 MG PO TABS: ORAL_TABLET | ORAL | 3 refills | Status: DC

## 2018-11-16 MED ORDER — FLUOXETINE HCL 10 MG PO TABS: ORAL_TABLET | ORAL | 3 refills | Status: DC

## 2018-11-16 NOTE — Progress Notes (Signed)
Assessment and plan:  1. Lumbar discogenic pain syndrome   2. Radiculopathy, lumbar region   3. Grief reaction- loss of husband Paula Conway Aug 8th, 2020   4. Reaction, situational, acute, to stress   5. Hyperlipidemia, unspecified hyperlipidemia type   6. Hypertriglyceridemia   7. Prediabetes      Acute Reaction to Stress - Loss of Husband Paula Conway Aug 8th, 2020 - Reviewed patient's symptoms at length during appointment today.  - Patient tolerating Cymbalta well; "less meltdowns" on current management.  - Discussed incorporating additional prescription to patient's treatment plan.  - Patient agrees to try Prozac at this time.  See med list. - Begin 10 mg daily.  - Strongly recommended establishing with therapist/counselor near future. - Per patient, has access to some resources through [facility]. - Patient agrees to call and establish with therapist as advised.  - In addition to therapy and prescription intervention, reviewed the "spokes of the wheel" of mood and health management.  Stressed the importance of ongoing prudent habits, including regular exercise, appropriate sleep hygiene, healthful dietary habits, and prayer/meditation to relax.  - Lengthy conversation held with patient today regarding her options for mood intervention. - Advised patient to choose a time every day to engage in purposeful self-care.  - Will continue to monitor.  Lumbar discogenic pain symdrome; lumbar radiculopathy - Recently started Cymbalta.  See med list. - Per pt, pain has improved while managed on Cymbalta. - Continue management as established.  - Encouraged patient to continue yoga and prudent physical activity for additional relief.  - Will continue to monitor.  Prediabetes - A1c 5.9 last check, down from 6.0 prior.  - Counseled patient on prevention of diabetes and discussed dietary and lifestyle modification as first line.     - Importance of low carb, heart-healthy diet discussed with patient in addition to regular aerobic exercise of 22mn 5d/week or more.   - Will continue to monitor and re-check as recommended.  Hyperlipidemia, Hypertriglyceridemia Triglycerides = 188, elevated, up from 185 prior LDL = 163, elevated, but down from 169 prior HDL = 49, down from 54 prior  The 10-year ASCVD risk score (Paula Conway., et al., 2013) is: 5.5%   Values used to calculate the score:     Age: [age]years     Sex: Female     Is Non-Hispanic African American: No     Diabetic: No     Tobacco smoker: No     Systolic Blood Pressure: 1716mmHg     Is BP treated: No     HDL Cholesterol: 49 mg/dL     Total Cholesterol: 247 mg/dL  - Cholesterol levels are not at goal.  - Reviewed indications for beginning prescription intervention at this time. - Statin prescription provided today.  See med list. - Education provided and conversation held with patient.  All questions answered.  - To help reduce incidence of side-effects on statin management, encouraged adequate hydration, and regular prudent exercise/ physical activity.  - Prudent dietary changes such as low saturated & trans fat diets for hyperlipidemia and low carb diets for hypertriglyceridemia discussed with patient.    - Encouraged patient to follow AHA guidelines for regular exercise and also engage in weight loss if BMI above 25.   - Educational handouts provided at patient's desire and/ or told to look online at the [facility] for further information.  - We will continue to monitor and re-check as recommended.  BMI Counseling - 31.02 Explained to patient what BMI refers to, and what it means medically.    Told patient to think about it as a "medical risk stratification measurement" and how increasing BMI is associated with increasing risk/ or worsening state of various diseases such as hypertension, hyperlipidemia, diabetes, premature OA,  depression etc.  American Heart Association guidelines for healthy diet, basically Mediterranean diet, and exercise guidelines of 30 minutes 5 days per week or more discussed in detail.  Health counseling performed.  All questions answered.  Lifestyle & Preventative Health Maintenance - Advised patient to continue working toward exercising to improve overall mental, physical, and emotional health.    - Encouraged patient to engage in daily physical activity, especially a formal exercise routine.  Recommended that the patient eventually strive for at least 150 minutes of moderate cardiovascular activity per week according to guidelines established by the [facility].   - Healthy dietary habits encouraged, including low-carb, and high amounts of lean protein in diet.   - Patient should also consume adequate amounts of water.   Education and routine counseling performed. Handouts provided.  Recommendations - Patient will call [facility] for access to therapy resources.  - Return in 6 weeks for repeat FLP, liver enzymes, with OV 3 days later to f/up on mood.  Patient was interviewed and evaluated by me/ [facility] staff members in the clinic today for 40+ minutes, with over 50% of my time spent in face to face counseling of patients various medical conditions, treatment plans of those medical conditions including medicine management and lifestyle modification, strategies to improve health and well being; and in coordination of care.   SEE TREATMENT PLAN FOR DETAILS  Meds ordered this encounter  Medications  . FLUoxetine (PROZAC) 10 MG tablet    Sig: Use one half tablet daily for 6d; then 1 tablet daily    Dispense:  90 tablet    Refill:  3  . rosuvastatin (CRESTOR) 5 MG tablet    Sig: 1 tab every other night b-4 bed    Dispense:  45 tablet    Refill:  3    Return for lab-only ALT, FLP in 6 weeks, with OV 3 days later to f/up on mood, cholesterol.   Anticipatory guidance and routine counseling done  re: condition, txmnt options and need for follow up. All questions of patient's were answered.   Gross side effects, risk and benefits, and alternatives of medications discussed with patient.  Patient is aware that all medications have potential side effects and we are unable to predict every sideeffect or drug-drug interaction that may occur.  Expresses verbal understanding and consents to current therapy plan and treatment regiment.  Please see AVS handed out to patient at the end of our visit for additional patient instructions/ counseling done pertaining to today's office visit.  Note:  This document was prepared using Dragon voice recognition software and may include unintentional dictation errors.  This document serves as a record of services personally performed by Mellody Dance, DO. It was created on her behalf by Toni Amend, a trained medical scribe. The creation of this record is based on the scribe's personal observations and the provider's statements to them.   This case required medical decision making of at least moderate complexity. The above documentation has been reviewed to be accurate and was completed by Marjory Sneddon, D.O.    ----------------------------------------------------------------------------------------------------------------------  Subjective:   Cancel Odor, am serving as scribe for Dr. Mellody Dance.  CC:  Paula Conway is a 63 y.o. female who presents to [facility] [facility] today for review and discussion of recent bloodwork that was done in addition to f/up on chronic conditions we are managing for pt.  1. All recent blood work that we ordered was reviewed with patient today.  Patient was counseled on all abnormalities and we discussed dietary and lifestyle changes that could help those values (also medications when appropriate).  Extensive health counseling performed and all patient's concerns/ questions  were addressed.  See labs below and also plan for more details of these abnormalities  Mood Management Notes doing okay on the Cymbalta.  "I don't have as many meltdowns."  When asked if she's feeling better, repeats "I guess."  Says she still has some meltdowns, "but they're not as blown out, maybe."  Her pain is a little better  Notes her left side is still worse than the right; still has pain in her knee and pain going "up and down the back of my knee."  States she's been doing yoga, which also helps.  Notes that she's been babysitting her grandkids daily due to a relative in the ICU for their third week.  Says being around her grandkids is a nice distraction from life in general.  Notes "I'm not working; I'm home in the house with my grand babies."  In addition, her daughter is making her go to the [employer] [place].  Says she hasn't thought about going to a grief counselor since the death of her husband.  HPI:  Hyperlipidemia:  63 y.o. female here for cholesterol follow-up.   - Patient reports good compliance with treatment plan of:  medication and/ or lifestyle management.    - Patient denies any acute concerns or problems with management plan   - She denies new onset of: myalgias, arthralgias, increased fatigue more than normal, chest pains, exercise intolerance, shortness of breath, dizziness, visual changes, headache, lower extremity swelling or claudication.   Most recent cholesterol panel was:  Lab Results  Component Value Date   CHOL 247 (H) 11/13/2018   HDL 49 11/13/2018   LDLCALC 163 (H) 11/13/2018   TRIG 188 (H) 11/13/2018   CHOLHDL 5.0 (H) 11/13/2018   Hepatic Function Latest Ref Rng & Units 01/26/2018 07/24/2017 08/31/2016  Total Protein 6.0 - 8.5 g/dL 7.1 6.7 6.9  Albumin 3.6 - 4.8 g/dL 4.7 4.4 4.4  AST 0 - 40 IU/L 24 15 19   ALT 0 - 32 IU/L 29 21 17   Alk Phosphatase 39 - 117 IU/L 77 69 78  Total Bilirubin 0.0 - 1.2 mg/dL 0.4 0.4 0.3      Depression screen [facility] 2/9 11/16/2018 11/23/2017 07/24/2017  Decreased Interest 2 0 0  Down, Depressed, Hopeless 0 0 0  PHQ - 2 Score 2 0 0  Altered sleeping 1 1 1   Tired, decreased energy 0 1 0  Change in appetite 1 0 0  Feeling bad or failure about yourself  0 0 0  Trouble concentrating 0 0 0  Moving slowly or fidgety/restless 1 0 0  Suicidal thoughts 0 0 0  PHQ-9 Score 5 2 1   Difficult doing work/chores Not difficult at all Not difficult at all Not difficult at all   GAD 7 : Generalized Anxiety Score 11/16/2018  Nervous, Anxious, on Edge 0  Control/stop worrying 3  Worry too much - different things 3  Trouble relaxing 0  Restless 1  Easily annoyed  or irritable 0  Afraid - awful might happen 0  Total GAD 7 Score 7  Anxiety Difficulty Not difficult at all        Wt Readings from Last 3 Encounters:  11/16/18 192 lb 1.6 oz (87.1 kg)  10/02/18 193 lb 4.8 oz (87.7 kg)  11/23/17 200 lb 6.4 oz (90.9 kg)   BP Readings from Last 3 Encounters:  11/16/18 129/83  10/02/18 (!) 123/98  11/23/17 126/82   Pulse Readings from Last 3 Encounters:  11/16/18 71  10/02/18 67  11/23/17 69   BMI Readings from Last 3 Encounters:  11/16/18 31.01 kg/m  10/02/18 31.20 kg/m  11/23/17 34.40 kg/m     Patient Care Team    Relationship Specialty Notifications Start End  Mellody Dance, DO PCP - General Family Medicine  07/16/15     Full medical history updated and reviewed in the office today  Patient Active Problem List   Diagnosis Date Noted  . Obesity, Class I, BMI 30-34.9 08/30/2016    Priority: High  . Prediabetes 07/30/2015    Priority: High  . Elevated LDL cholesterol level 07/30/2015    Priority: High  . Hyperlipidemia 07/20/2015    Priority: High  . Family history of breast cancer in Mother 07/20/2015    Priority: Medium  . GERD (gastroesophageal reflux disease) 07/20/2015    Priority: Medium  . Generalized OA 07/20/2015    Priority: Low  . Environmental and  seasonal allergies 07/20/2015    Priority: Low  . Spinal stenosis: Chronic back pain  07/20/2015    Priority: Low  . Statin declined 11/16/2018  . Hypertriglyceridemia 11/16/2018  . Grief reaction- loss of husband Paula Conway Aug 8th, 2020 10/02/2018  . Reaction, situational, acute, to stress 10/02/2018  . Dysfunction of both eustachian tubes 07/24/2017  . BPPV (benign paroxysmal positional vertigo), unspecified laterality 07/24/2017  . Vitamin D insufficiency 03/15/2017  . Lumbar discogenic pain syndrome 02/15/2017  . Radiculopathy, lumbar region 02/15/2017  . Arthritis of facet joints at multiple vertebral levels 02/15/2017  . Low back pain 02/15/2017  . Glucose intolerance (impaired glucose tolerance) 09/14/2016  . Health education/counseling 08/30/2016  . Elevated TSH 07/30/2015  . Encounter for screening mammogram for breast cancer 07/16/2015  . Neoplasm of connective tissue 11/23/2011    Past Medical History:  Diagnosis Date  . Allergy   . Arthritis   . Environmental and seasonal allergies 07/20/2015  . Family history of breast cancer in Mother 07/20/2015   Mother was diagnosed at 58 years old and died age 2 of breast cancer   . GERD (gastroesophageal reflux disease)   . Hyperlipidemia   . Obesity 07/20/2015  . Spinal stenosis     Past Surgical History:  Procedure Laterality Date  . APPENDECTOMY    . BREAST BIOPSY    . core biopsy      Social History   Tobacco Use  . Smoking status: Former Smoker    Packs/day: 0.25    Years: 3.00    Pack years: 0.75    Quit date: 01/11/1975    Years since quitting: 43.8  . Smokeless tobacco: Never Used  Substance Use Topics  . Alcohol use: Yes    Comment: socially    Family Hx: Family History  Problem Relation Age of Onset  . Cancer Mother        breast  . Breast cancer Mother   . Diabetes Father   . Hypertension Father   . Heart disease Father  Medications: Current Outpatient Medications  Medication Sig Dispense  Refill  . Ascorbic Acid (VITAMIN C) 1000 MG tablet Take 1,000 mg by mouth daily.    . Cholecalciferol (VITAMIN D3) 5000 units CAPS Take 5,000 Units by mouth daily.    . DULoxetine (CYMBALTA) 20 MG capsule Take 1 capsule (20 mg total) by mouth daily. 90 capsule 1  . Glucos-Chond-Hyal Ac-Ca Fructo (MOVE FREE JOINT HEALTH ADVANCE PO) Take 1 tablet by mouth daily.    . Magnesium 250 MG TABS Take 1 tablet by mouth daily.    . pregabalin (LYRICA) 75 MG capsule Take 1 capsule (75 mg total) by mouth 2 (two) times daily. 180 capsule 1  . Probiotic Product (PROBIOTIC PO) Take 1 tablet by mouth daily.    . Vitamin D, Ergocalciferol, (DRISDOL) 1.25 MG (50000 UT) CAPS capsule TAKE 1 CAPSULE BY MOUTH EVERY 7 DAYS. 12 capsule 10  . FLUoxetine (PROZAC) 10 MG tablet Use one half tablet daily for 6d; then 1 tablet daily 90 tablet 3  . fluticasone (FLONASE) 50 MCG/ACT nasal spray 1 spray each nostril twice daily after sterile sinus rinses with AYR/neil med (Patient not taking: Reported on 11/16/2018) 1 g 5  . rosuvastatin (CRESTOR) 5 MG tablet 1 tab every other night b-4 bed 45 tablet 3   No current facility-administered medications for this visit.     Allergies:  No Known Allergies   Review of Systems: General:   No F/C, wt loss Pulm:   No DIB, SOB, pleuritic chest pain Card:  No CP, palpitations Abd:  No n/v/d or pain Ext:  No inc edema from baseline  Objective:  Blood pressure 129/83, pulse 71, temperature 98.4 F (36.9 C), temperature source Oral, resp. rate 12, height 5' 6"  (1.676 m), weight 192 lb 1.6 oz (87.1 kg), SpO2 99 %. Body mass index is 31.01 kg/m. Gen:   Well NAD, A and O *3 HEENT:    [place]/AT, EOMI,  MMM Lungs:   Normal work of breathing. CTA B/L, no Wh, rhonchi Heart:   RRR, S1, S2 WNL's, no MRG Abd:   No gross distention Exts:    warm, pink,  Brisk capillary refill, warm and well perfused.  Psych:    No HI/SI, judgement and insight good, Euthymic mood. Full Affect.   Recent  Results (from the past 2160 hour(s))  Hemoglobin A1c     Status: Abnormal   Collection Time: 11/13/18  9:16 AM  Result Value Ref Range   Hgb A1c MFr Bld 5.9 (H) 4.8 - 5.6 %    Comment:          Prediabetes: 5.7 - 6.4          Diabetes: >6.4          Glycemic control for adults with diabetes: <7.0    Est. average glucose Bld gHb Est-mCnc 123 mg/dL  Lipid panel     Status: Abnormal   Collection Time: 11/13/18  9:16 AM  Result Value Ref Range   Cholesterol, Total 247 (H) 100 - 199 mg/dL   Triglycerides 188 (H) 0 - 149 mg/dL   HDL 49 >39 mg/dL   VLDL Cholesterol Cal 35 5 - 40 mg/dL   LDL Chol Calc (NIH) 163 (H) 0 - 99 mg/dL   Chol/HDL Ratio 5.0 (H) 0.0 - 4.4 ratio    Comment:  T. Chol/HDL Ratio                                             Men  Women                               1/2 Avg.Risk  3.4    3.3                                   Avg.Risk  5.0    4.4                                2X Avg.Risk  9.6    7.1                                3X Avg.Risk 23.4   11.0

## 2018-11-16 NOTE — Patient Instructions (Addendum)
Please call [facility] for counseling.  Please take one half tablet of Fluoxetine daily for six days, then go to one tablet daily.  Please add 45 minutes of walking daily for yourself.    Managing Loss, Adult People experience loss in many different ways throughout their lives. Events such as moving, changing jobs, and losing friends can create a sense of loss. The loss may be as serious as a major health change, divorce, death of a pet, or death of a loved one. All of these types of loss are likely to create a physical and emotional reaction known as grief. Grief is the result of a major change or an absence of something or someone that you count on. Grief is a normal reaction to loss. A variety of factors can affect your grieving experience, including:  The nature of your loss.  Your relationship to what or whom you lost.  Your understanding of grief and how to manage it.  Your support system. How to manage lifestyle changes Keep to your normal routine as much as possible.  If you have trouble focusing or doing normal activities, it is acceptable to take some time away from your normal routine.  Spend time with friends and loved ones.  Eat a healthy diet, get plenty of sleep, and rest when you feel tired. How to recognize changes  The way that you deal with your grief will affect your ability to function as you normally do. When grieving, you may experience these changes:  Numbness, shock, sadness, anxiety, anger, denial, and guilt.  Thoughts about death.  Unexpected crying.  A physical sensation of emptiness in your stomach.  Problems sleeping and eating.  Tiredness (fatigue).  Loss of interest in normal activities.  Dreaming about or imagining seeing the person who died.  A need to remember what or whom you lost.  Difficulty thinking about anything other than your loss for a period of time.  Relief. If you have been expecting the loss for a while, you may feel a sense  of relief when it happens. Follow these instructions at home:  Activity Express your feelings in healthy ways, such as:  Talking with others about your loss. It may be helpful to find others who have had a similar loss, such as a support group.  Writing down your feelings in a journal.  Doing physical activities to release stress and emotional energy.  Doing creative activities like painting, sculpting, or playing or listening to music.  Practicing resilience. This is the ability to recover and adjust after facing challenges. Reading some resources that encourage resilience may help you to learn ways to practice those behaviors. General instructions  Be patient with yourself and others. Allow the grieving process to happen, and remember that grieving takes time. ? It is likely that you may never feel completely done with some grief. You may find a way to move on while still cherishing memories and feelings about your loss. ? Accepting your loss is a process. It can take months or longer to adjust.  Keep all follow-up visits as told by your health care provider. This is important. Where to find support To get support for managing loss:  Ask your health care provider for help and recommendations, such as grief counseling or therapy.  Think about joining a support group for people who are managing a loss. Where to find more information You can find more information about managing loss from:  American Society of Clinical Oncology: www.cancer.net  American Psychological Association: TVStereos.ch Contact a health care provider if:  Your grief is extreme and keeps getting worse.  You have ongoing grief that does not improve.  Your body shows symptoms of grief, such as illness.  You feel depressed, anxious, or lonely. Get help right away if:  You have thoughts about hurting yourself or others. If you ever feel like you may hurt yourself or others, or have thoughts about taking  your own life, get help right away. You can go to your nearest emergency department or call:  Your local emergency services (911 in the [place]  A suicide crisis helpline, such as the [facility] at 308-510-6127. This is open 24 hours a day. Summary  Grief is the result of a major change or an absence of someone or something that you count on. Grief is a normal reaction to loss.  The depth of grief and the period of recovery depend on the type of loss and your ability to adjust to the change and process your feelings.  Processing grief requires patience and a willingness to accept your feelings and talk about your loss with people who are supportive.  It is important to find resources that work for you and to realize that people experience grief differently. There is not one grieving process that works for everyone in the same way.  Be aware that when grief becomes extreme, it can lead to more severe issues like isolation, depression, anxiety, or suicidal thoughts. Talk with your health care provider if you have any of these issues. This information is not intended to replace advice given to you by your health care provider. Make sure you discuss any questions you have with your health care provider. Document Released: 05/12/2016 Document Revised: 03/02/2018 Document Reviewed: 05/12/2016 Elsevier Patient Education  [employer].

## 2018-11-19 ENCOUNTER — Encounter: Payer: Self-pay | Admitting: Family Medicine

## 2018-11-19 ENCOUNTER — Telehealth: Payer: Self-pay | Admitting: Family Medicine

## 2018-11-19 DIAGNOSIS — F4321 Adjustment disorder with depressed mood: Secondary | ICD-10-CM

## 2018-11-19 NOTE — Telephone Encounter (Signed)
Referral placed to Psychology per patient request and [place] verbal. AS, CMA

## 2018-11-27 ENCOUNTER — Other Ambulatory Visit: Payer: Self-pay | Admitting: Family Medicine

## 2018-11-27 DIAGNOSIS — Z1231 Encounter for screening mammogram for malignant neoplasm of breast: Secondary | ICD-10-CM

## 2018-11-28 ENCOUNTER — Other Ambulatory Visit: Payer: Self-pay

## 2018-11-28 ENCOUNTER — Other Ambulatory Visit: Payer: Self-pay | Admitting: Family Medicine

## 2018-11-28 ENCOUNTER — Ambulatory Visit
Admission: RE | Admit: 2018-11-28 | Discharge: 2018-11-28 | Disposition: A | Discharge status: Home / Self Care | Payer: BC Managed Care – PPO | Source: Ambulatory Visit | Attending: Family Medicine | Admitting: Family Medicine

## 2018-11-28 DIAGNOSIS — Z1231 Encounter for screening mammogram for malignant neoplasm of breast: Secondary | ICD-10-CM

## 2018-12-25 ENCOUNTER — Ambulatory Visit (INDEPENDENT_AMBULATORY_CARE_PROVIDER_SITE_OTHER): Payer: BC Managed Care – PPO | Admitting: Licensed Clinical Social Worker

## 2018-12-25 ENCOUNTER — Other Ambulatory Visit: Payer: BC Managed Care – PPO

## 2018-12-25 ENCOUNTER — Other Ambulatory Visit: Payer: Self-pay

## 2018-12-25 DIAGNOSIS — F321 Major depressive disorder, single episode, moderate: Secondary | ICD-10-CM

## 2018-12-25 DIAGNOSIS — E785 Hyperlipidemia, unspecified: Secondary | ICD-10-CM

## 2018-12-25 DIAGNOSIS — Z79899 Other long term (current) drug therapy: Secondary | ICD-10-CM

## 2018-12-26 LAB — ALT: ALT: 20 IU/L (ref 0–32)

## 2018-12-26 LAB — LIPID PANEL
Chol/HDL Ratio: 3.5 ratio (ref 0.0–4.4)
Cholesterol, Total: 188 mg/dL (ref 100–199)
HDL: 54 mg/dL (ref >39)
LDL Chol Calc (NIH): 113 mg/dL — ABNORMAL HIGH (ref 0–99)
Triglycerides: 118 mg/dL (ref 0–149)
VLDL Cholesterol Cal: 21 mg/dL (ref 5–40)

## 2018-12-28 ENCOUNTER — Ambulatory Visit (INDEPENDENT_AMBULATORY_CARE_PROVIDER_SITE_OTHER): Payer: BC Managed Care – PPO | Admitting: Family Medicine

## 2018-12-28 ENCOUNTER — Encounter: Payer: Self-pay | Admitting: Family Medicine

## 2018-12-28 ENCOUNTER — Other Ambulatory Visit: Payer: Self-pay

## 2018-12-28 VITALS — BP 118/83 | HR 65 | Temp 97.8°F | Ht 66.0 in | Wt 182.0 lb

## 2018-12-28 DIAGNOSIS — E781 Pure hyperglyceridemia: Secondary | ICD-10-CM | POA: Diagnosis not present

## 2018-12-28 DIAGNOSIS — M5416 Radiculopathy, lumbar region: Secondary | ICD-10-CM

## 2018-12-28 DIAGNOSIS — E785 Hyperlipidemia, unspecified: Secondary | ICD-10-CM

## 2018-12-28 DIAGNOSIS — M159 Polyosteoarthritis, unspecified: Secondary | ICD-10-CM

## 2018-12-28 DIAGNOSIS — Z7189 Other specified counseling: Secondary | ICD-10-CM

## 2018-12-28 DIAGNOSIS — R7989 Other specified abnormal findings of blood chemistry: Secondary | ICD-10-CM

## 2018-12-28 DIAGNOSIS — F430 Acute stress reaction: Secondary | ICD-10-CM

## 2018-12-28 DIAGNOSIS — F4321 Adjustment disorder with depressed mood: Secondary | ICD-10-CM

## 2018-12-28 DIAGNOSIS — E559 Vitamin D deficiency, unspecified: Secondary | ICD-10-CM

## 2018-12-28 DIAGNOSIS — R7303 Prediabetes: Secondary | ICD-10-CM

## 2018-12-28 DIAGNOSIS — F4320 Adjustment disorder, unspecified: Secondary | ICD-10-CM

## 2018-12-28 DIAGNOSIS — M5126 Other intervertebral disc displacement, lumbar region: Secondary | ICD-10-CM

## 2018-12-28 MED ORDER — ROSUVASTATIN CALCIUM 5 MG PO TABS: ORAL_TABLET | ORAL | 1 refills | Status: DC

## 2018-12-28 NOTE — Progress Notes (Signed)
      Virtual / live video office visit note for Paula Conway, D.O- Primary Care Physician at [facility]   I connected with current patient today and beyond visually recognizing the correct individual, I verified that I am speaking with the correct person using two identifiers.  . Location of the patient: Home . Location of the provider: Office Only the patient (+/- their family members at pt's discretion) and myself were participating in the encounter    - This visit type was conducted due to national recommendations for restrictions regarding the COVID-19 Pandemic (e.g. social distancing) in an effort to limit this patient's exposure and mitigate transmission in our community.  This format is felt to be most appropriate for this patient at this time.   - The patient did have access to video technology today  - No physical exam could be performed with this format, beyond that communicated to us by the patient/ family members as noted.   - Additionally my office staff/ schedulers discussed with the patient that there may be a monetary charge related to this service, depending on patient's medical insurance.   The patient expressed understanding, and agreed to proceed.      History of Present Illness: Depression, Hyperlipidemia, and Results   I, Paula Conway, am serving as scribe for Dr. Deborah Conway.  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  Notes this is why she is babysitting the 63 year-old and 63 year-old.   - Mood Management Management on Prozac "seems to be okay."  She takes her Prozac in the morning.  Her routine is waking up, then taking Prozac and Lyrica on the way to take her dog out.  When she first increased to the full tablet dosage of  Prozac, "sometimes I noticed that I would take it in the morning and find a bit later that I'd be sitting there wondering, where did the time go?"  Says it was "kind of like staring at something forever, but consciously not thinking of anything; I'm just there."    Says this side-effect only happened a couple of times when she first started the full tablet, and has improved since.  Denies feeling numbness / dulled affect overall.  Thinks these initial S-E have definitely improved.  On current management, states she "doesn't just burst out crying over any little bitty thing now;" "I don't cry unless I'm talking about Jim."  Says "I try to laugh instead of cry, and I haven't had a really good cry lately."  Overall feels less tearful.   - Management on Lyrica & Cymbalta for neuropathic lumbar radicular pain and FM She continues Lyrica and Cymbalta.  Was taking the Lyrica in the morning with the Prozac, taking Cymbalta in the afternoon, and Lyrica at night, "but then I was sleepy all the time during the day."  Says she tried a couple combinations of taking her meds and found she is less sleepy without the extra Lyrica in the afternoon.    Notes the Cymbalta helps her to go to sleep at night.  Says "I don't stay asleep, but I go to sleep."  Notes she "doesn't lay there awake as long anymore" unless she has a lot on her mind.  Her counselor   gave her an app to use at night as well, to help calm her mind.  Last saw her counselor this week, and sees her again this coming Tuesday morning.  Says they are trying to start weekly visits.   - Exercise She continues to engage in yoga when she can.  Says "it seems like the days I'm babysitting are the days I'm supposed to go to yoga."  Trying her best to walk on the treadmill; notes "I can't make it 45 minutes yet, but I'm trying."  Notes she likes walking outside better than inside.   - Radicular Back Pain Notes "I still have my moments, still have my  days."  Notes recently with all the bad weather, she woke up one day with an awful headache, and states "my back was killing me as well."  Was sitting in the recliner with her heating pad to treat.  Notes she's been living with the back pain for so long, that she knows how to deal with it; if it really gets bad, she takes meds, but tries not to take them unless she "really has to."   - Vitamin D Insufficiency She continues Vitamin D daily and once weekly.   HPI:  Hyperlipidemia:  63 y.o. female here for cholesterol follow-up.   - Patient reports good compliance with treatment plan of:  medication and/ or lifestyle management.  Notes her diet has changed because she's not eating as much.  Says she cannot eat three meals per day anymore.  Says "I haven't had an actual potato or rice since before Jim passed."  Confirms that she is taking her Crestor every other night before bed.  She denies S-E; "not that I know of, not that I can think of.  So far I feel that I am doing good."  Says she tries to hydrate; "has water all the time."  Notes her water has to be "cold cold," but she does have water with her most of the time now, and hasn't had a soft drink in a long time.  Has sweet tea when she goes out to eat, but hasn't been out to eat lately.  - Patient denies any acute concerns or problems with management plan   - She denies new onset of: myalgias, arthralgias, increased fatigue more than normal, chest pains, exercise intolerance, shortness of breath, dizziness, visual changes, headache, lower extremity swelling or claudication.   Most recent cholesterol panel was:  Lab Results  Component Value Date   CHOL 188 12/25/2018   HDL 54 12/25/2018   LDLCALC 113 (H) 12/25/2018   TRIG 118 12/25/2018   CHOLHDL 3.5 12/25/2018   Hepatic Function Latest Ref Rng & Units 12/25/2018 01/26/2018 07/24/2017  Total Protein 6.0 - 8.5 g/dL - 7.1 6.7  Albumin 3.6 - 4.8 g/dL - 4.7 4.4  AST 0 - 40 IU/L - 24  15  ALT 0 - 32 IU/L 20 29 21  Alk Phosphatase 39 - 117 IU/L - 77 69  Total Bilirubin 0.0 - 1.2 mg/dL - 0.4 0.4           Depression screen PHQ 2/9 12/28/2018 11/16/2018 11/23/2017 07/24/2017 03/15/2017  Decreased Interest 0 2 0 0 0  Down, Depressed, Hopeless 0 0 0 0 0  PHQ - 2 Score 0 2 0 0 0  Altered sleeping 3 1 1 1 1  Tired, decreased energy 0 0 1 0 0  Change in appetite 0 1 0 0 0  Feeling bad or failure   about yourself  0 0 0 0 0  Trouble concentrating 0 0 0 0 0  Moving slowly or fidgety/restless 0 1 0 0 0  Suicidal thoughts 0 0 0 0 0  PHQ-9 Score 3 5 2 1 1  Difficult doing work/chores Not difficult at all Not difficult at all Not difficult at all Not difficult at all Not difficult at all     GAD 7 : Generalized Anxiety Score 12/28/2018 11/16/2018  Nervous, Anxious, on Edge 1 0  Control/stop worrying 1 3  Worry too much - different things 1 3  Trouble relaxing 0 0  Restless 1 1  Easily annoyed or irritable 1 0  Afraid - awful might happen 1 0  Total GAD 7 Score 6 7  Anxiety Difficulty Not difficult at all Not difficult at all       Impression and Recommendations:    1. Grief reaction- loss of husband James Aug 8th, 2020   2. Reaction, situational, acute, to stress   3. Hyperlipidemia, unspecified hyperlipidemia type   4. Hypertriglyceridemia   5. Lumbar discogenic pain syndrome   6. Radiculopathy, lumbar region   7. Generalized OA   8. Prediabetes   9. Vitamin D insufficiency   10. Elevated TSH     Vitamin D Insufficiency - Last checked eleven months ago, January 2020, 61.3. - Discussed goal range of 40-60. - Continue supplementation as prescribed. - Will continue to monitor and re-check as discussed.  Prediabetes - Down to 5.9 from 6.0 last check. - Encouraged patient to continue to decrease carb intake.  - Counseled patient on prevention of diabetes and discussed dietary and lifestyle modification as first line.     - Importance of low carb,  heart-healthy diet discussed with patient in addition to regular aerobic exercise of 30min 5d/week or more.   - We will continue to monitor and re-check as discussed.  Lumbar Discogenic Pain Syndrome; Lumbar Radiculopathy - Managed on Cymbalta and Lyrica. - Stable at this time. - Patient will continue treatment plan as prescribed.  See med list. - Will continue to monitor  Acute Stress & Grief Reaction - Loss of Husband August 18 2018 - Started Prozac last OV. - Per patient, experienced some numbed emotions at first, but tolerating well now without S-E. - Discussed that patient may see if half-tablet works better for her in terms of S-E than full-tablet.  - Discussed goal to keep patient's mood more manageable during her ongoing time of grief.  - Encouraged patient to continue counseling/therapy as established.  - In addition to therapy/counseling and prescription intervention, reviewed the "spokes of the wheel" of mood and health management.  Stressed the importance of ongoing prudent habits, including regular exercise, appropriate sleep hygiene, healthful dietary habits, and prayer/meditation to relax.  - Will continue to monitor.  Hyperlipidemia, Hypertriglyceridemia - Started Crestor last OV.  Last FLP obtained 3 days ago: - Triglycerides = down to 118 from 247 prior. - HDL = increased to 54 from 49 prior. - LDL = down to 113 from 163 prior.  - Patient agrees to increase Crestor to one 5 mg tablet per night.  See med list.  Discussed that with adequate hydration and regular physical activity, side-effects are minimized on statin management.  - Ongoing dietary changes such as low saturated & trans fat diets for hyperlipidemia and low carb diets for hypertriglyceridemia discussed with patient.    - Encouraged patient to follow AHA guidelines for regular exercise and also engage in   weight loss if BMI above 25.   - Educational handouts provided at patient's desire and/ or told to  look online at the American Heart Association website for further information.  - We will continue to monitor and re-check as discussed.  Lifestyle & Preventative Health Maintenance - Advised patient to continue working toward exercising to improve overall mental, physical, and emotional health.    - Encouraged patient to engage in daily physical activity, especially a formal exercise routine.  Recommended that the patient eventually strive for at least 150 minutes of moderate cardiovascular activity per week according to guidelines established by the AHA.   - Healthy dietary habits encouraged, including low-carb, and high amounts of lean protein in diet.   - Patient should also consume adequate amounts of water.  - Health counseling performed.  All questions answered.  Novel COVID-19 Counseling - Novel Covid -19 counseling done; all questions were answered.   - Current CDC / federal and [place] guidelines reviewed with patient  - Reminded pt of extreme importance of social distancing; wearing a mask when out in public; insensate handwashing and cleaning of surfaces, avoiding unnecessary trips for shopping and avoiding ALL but emergency appts etc. - Told patient to be prepared, not scared; and be smart for the sake of others - Patient will call with any additional concerns  Recommendations - Re-check lab work including A1c, thyroid, CMP, Vit D, FLP in 4 months after increased dose of statin.   - As part of my medical decision making, I reviewed the following data within the electronic medical record:  History obtained from pt /family, CMA notes reviewed and incorporated if applicable, Labs reviewed, Radiograph/ tests reviewed if applicable and OV notes from prior OV's with me, as well as other specialists she/he has seen since seeing me last, were all reviewed and used in my medical decision making process today.   - Additionally, discussion had with patient regarding txmnt plan, their  biases about that plan etc were used in my medical decision making today.   - The patient agreed with the plan and demonstrated an understanding of the instructions.   No barriers to understanding were identified.    - Red flag symptoms and signs discussed in detail.  Patient expressed understanding regarding what to do in case of emergency\ urgent symptoms.  The patient was advised to call back or seek an in-person evaluation if the symptoms worsen or if the condition fails to improve as anticipated.   Return for lab work, A1c, thyroid, CMP, Vit D, FLP in 4 months, after increasing dose of statin.    Orders Placed This Encounter  Procedures  . TSH  . T4, free- Future  . Comprehensive metabolic panel  . Hemoglobin A1c  . Lipid panel  . VITAMIN D 25 Hydroxy (Vit-D Deficiency, Fractures)    Meds ordered this encounter  Medications  . rosuvastatin (CRESTOR) 5 MG tablet    Sig: 1 tab every night b-4 bed    Dispense:  90 tablet    Refill:  1    Medications Discontinued During This Encounter  Medication Reason  . fluticasone (FLONASE) 50 MCG/ACT nasal spray No longer needed (for PRN medications)  . rosuvastatin (CRESTOR) 5 MG tablet Reorder      Note:  This note was prepared with assistance of Dragon voice recognition software. Occasional wrong-word or sound-a-like substitutions may have occurred due to the inherent limitations of voice recognition software.  This document serves as a record of services   personally performed by Paula Opalski, DO. It was created on her behalf by Paula Conway, a trained medical scribe. The creation of this record is based on the scribe's personal observations and the provider's statements to them.   This case required medical decision making of at least moderate complexity. The above documentation has been reviewed to be accurate and was completed by Paula J. Conway, D.O.      Patient Care Team    Relationship Specialty Notifications  Start End  Conway, Deborah, DO PCP - General Family Medicine  07/16/15     -Vitals obtained; medications/ allergies reconciled;  personal medical, social, Sx etc.histories were updated by CMA, reviewed by me and are reflected in chart  Patient Active Problem List   Diagnosis Date Noted  . Obesity, Class I, BMI 30-34.9 08/30/2016  . Prediabetes 07/30/2015  . Elevated LDL cholesterol level 07/30/2015  . Hyperlipidemia 07/20/2015  . Family history of breast cancer in Mother 07/20/2015  . GERD (gastroesophageal reflux disease) 07/20/2015  . Generalized OA 07/20/2015  . Environmental and seasonal allergies 07/20/2015  . Spinal stenosis: Chronic back pain  07/20/2015  . Statin declined 11/16/2018  . Hypertriglyceridemia 11/16/2018  . Grief reaction- loss of husband James Aug 8th, 2020 10/02/2018  . Reaction, situational, acute, to stress 10/02/2018  . Dysfunction of both eustachian tubes 07/24/2017  . BPPV (benign paroxysmal positional vertigo), unspecified laterality 07/24/2017  . Vitamin D insufficiency 03/15/2017  . Lumbar discogenic pain syndrome 02/15/2017  . Radiculopathy, lumbar region 02/15/2017  . Arthritis of facet joints at multiple vertebral levels 02/15/2017  . Low back pain 02/15/2017  . Glucose intolerance (impaired glucose tolerance) 09/14/2016  . Health education/counseling 08/30/2016  . Elevated TSH 07/30/2015  . Encounter for screening mammogram for breast cancer 07/16/2015  . Neoplasm of connective tissue 11/23/2011     Current Meds  Medication Sig  . Ascorbic Acid (VITAMIN C) 1000 MG tablet Take 1,000 mg by mouth daily.  . Cholecalciferol (VITAMIN D3) 5000 units CAPS Take 5,000 Units by mouth daily.  . DULoxetine (CYMBALTA) 20 MG capsule Take 1 capsule (20 mg total) by mouth daily.  . FLUoxetine (PROZAC) 10 MG tablet Use one half tablet daily for 6d; then 1 tablet daily  . Glucos-Chond-Hyal Ac-Ca Fructo (MOVE FREE JOINT HEALTH ADVANCE PO) Take 1 tablet by mouth  daily.  . loratadine (CLARITIN) 10 MG tablet Take 10 mg by mouth daily.  . Magnesium 250 MG TABS Take 1 tablet by mouth daily.  . Multiple Vitamin (MULTIVITAMIN) tablet Take 1 tablet by mouth daily.  . pregabalin (LYRICA) 75 MG capsule Take 1 capsule (75 mg total) by mouth 2 (two) times daily. (Patient taking differently: Take 75 mg by mouth daily. )  . Probiotic Product (PROBIOTIC PO) Take 1 tablet by mouth daily.  . rosuvastatin (CRESTOR) 5 MG tablet 1 tab every night b-4 bed  . Vitamin D, Ergocalciferol, (DRISDOL) 1.25 MG (50000 UT) CAPS capsule TAKE 1 CAPSULE BY MOUTH EVERY 7 DAYS.  . [DISCONTINUED] rosuvastatin (CRESTOR) 5 MG tablet 1 tab every other night b-4 bed     No Known Allergies   ROS:  See above HPI for pertinent positives and negatives   Objective:   Blood pressure 118/83, pulse 65, temperature 97.8 F (36.6 C), temperature source Oral, height 5' 6" (1.676 m), weight 182 lb (82.6 kg).  (if some vitals are omitted, this means that patient was UNABLE to obtain them even though they were asked to get them prior to OV   today.  They were asked to call us at their earliest convenience with these once obtained.)  General: A & O * 3; visually in no acute distress; in usual state of health.  Skin: Visible skin appears normal and pt's usual skin color HEENT:  EOMI, head is normocephalic and atraumatic.  Sclera are anicteric. Neck has a good range of motion.  Lips are noncyanotic Chest: normal chest excursion and movement Respiratory: speaking in full sentences, no conversational dyspnea; no use of accessory muscles Psych: insight good, mood- appears full

## 2019-01-01 ENCOUNTER — Ambulatory Visit (INDEPENDENT_AMBULATORY_CARE_PROVIDER_SITE_OTHER): Payer: BC Managed Care – PPO | Admitting: Licensed Clinical Social Worker

## 2019-01-01 DIAGNOSIS — F324 Major depressive disorder, single episode, in partial remission: Secondary | ICD-10-CM

## 2019-01-15 ENCOUNTER — Ambulatory Visit (INDEPENDENT_AMBULATORY_CARE_PROVIDER_SITE_OTHER): Payer: BC Managed Care – PPO | Admitting: Licensed Clinical Social Worker

## 2019-01-15 DIAGNOSIS — F324 Major depressive disorder, single episode, in partial remission: Secondary | ICD-10-CM | POA: Diagnosis not present

## 2019-01-30 ENCOUNTER — Ambulatory Visit (INDEPENDENT_AMBULATORY_CARE_PROVIDER_SITE_OTHER): Payer: BC Managed Care – PPO | Admitting: Licensed Clinical Social Worker

## 2019-01-30 DIAGNOSIS — F324 Major depressive disorder, single episode, in partial remission: Secondary | ICD-10-CM | POA: Diagnosis not present

## 2019-02-06 ENCOUNTER — Other Ambulatory Visit: Payer: Self-pay | Admitting: Family Medicine

## 2019-02-06 DIAGNOSIS — E559 Vitamin D deficiency, unspecified: Secondary | ICD-10-CM

## 2019-02-13 ENCOUNTER — Ambulatory Visit (INDEPENDENT_AMBULATORY_CARE_PROVIDER_SITE_OTHER): Payer: BC Managed Care – PPO | Admitting: Licensed Clinical Social Worker

## 2019-02-13 DIAGNOSIS — F324 Major depressive disorder, single episode, in partial remission: Secondary | ICD-10-CM

## 2019-02-27 ENCOUNTER — Ambulatory Visit (INDEPENDENT_AMBULATORY_CARE_PROVIDER_SITE_OTHER): Payer: BC Managed Care – PPO | Admitting: Licensed Clinical Social Worker

## 2019-02-27 DIAGNOSIS — F324 Major depressive disorder, single episode, in partial remission: Secondary | ICD-10-CM

## 2019-03-14 ENCOUNTER — Ambulatory Visit (INDEPENDENT_AMBULATORY_CARE_PROVIDER_SITE_OTHER): Payer: BC Managed Care – PPO | Admitting: Licensed Clinical Social Worker

## 2019-03-14 DIAGNOSIS — F324 Major depressive disorder, single episode, in partial remission: Secondary | ICD-10-CM

## 2019-03-27 ENCOUNTER — Ambulatory Visit (INDEPENDENT_AMBULATORY_CARE_PROVIDER_SITE_OTHER): Payer: BC Managed Care – PPO | Admitting: Licensed Clinical Social Worker

## 2019-03-27 DIAGNOSIS — F324 Major depressive disorder, single episode, in partial remission: Secondary | ICD-10-CM

## 2019-03-29 ENCOUNTER — Other Ambulatory Visit: Payer: Self-pay | Admitting: Family Medicine

## 2019-03-29 DIAGNOSIS — F430 Acute stress reaction: Secondary | ICD-10-CM

## 2019-03-29 DIAGNOSIS — F4321 Adjustment disorder with depressed mood: Secondary | ICD-10-CM

## 2019-04-16 ENCOUNTER — Ambulatory Visit (INDEPENDENT_AMBULATORY_CARE_PROVIDER_SITE_OTHER): Payer: BC Managed Care – PPO | Admitting: Psychology

## 2019-04-16 DIAGNOSIS — F321 Major depressive disorder, single episode, moderate: Secondary | ICD-10-CM

## 2019-04-29 ENCOUNTER — Other Ambulatory Visit: Payer: BC Managed Care – PPO

## 2019-04-29 ENCOUNTER — Other Ambulatory Visit: Payer: Self-pay

## 2019-04-29 DIAGNOSIS — E559 Vitamin D deficiency, unspecified: Secondary | ICD-10-CM

## 2019-04-29 DIAGNOSIS — R7989 Other specified abnormal findings of blood chemistry: Secondary | ICD-10-CM

## 2019-04-29 DIAGNOSIS — E785 Hyperlipidemia, unspecified: Secondary | ICD-10-CM

## 2019-04-29 DIAGNOSIS — R7303 Prediabetes: Secondary | ICD-10-CM

## 2019-04-30 LAB — COMPREHENSIVE METABOLIC PANEL
ALT: 17 IU/L (ref 0–32)
AST: 18 IU/L (ref 0–40)
Albumin/Globulin Ratio: 2 (ref 1.2–2.2)
Albumin: 4.5 g/dL (ref 3.8–4.8)
Alkaline Phosphatase: 86 IU/L (ref 39–117)
BUN/Creatinine Ratio: 13 (ref 12–28)
BUN: 13 mg/dL (ref 8–27)
Bilirubin Total: 0.4 mg/dL (ref 0.0–1.2)
CO2: 20 mmol/L (ref 20–29)
Calcium: 9.6 mg/dL (ref 8.7–10.3)
Chloride: 100 mmol/L (ref 96–106)
Creatinine, Ser: 1.01 mg/dL — ABNORMAL HIGH (ref 0.57–1.00)
GFR calc Af Amer: 68 mL/min/{1.73_m2} (ref >59)
GFR calc non Af Amer: 59 mL/min/{1.73_m2} — ABNORMAL LOW (ref >59)
Globulin, Total: 2.2 g/dL (ref 1.5–4.5)
Glucose: 96 mg/dL (ref 65–99)
Potassium: 4.6 mmol/L (ref 3.5–5.2)
Sodium: 137 mmol/L (ref 134–144)
Total Protein: 6.7 g/dL (ref 6.0–8.5)

## 2019-04-30 LAB — LIPID PANEL
Chol/HDL Ratio: 2.9 ratio (ref 0.0–4.4)
Cholesterol, Total: 159 mg/dL (ref 100–199)
HDL: 55 mg/dL (ref >39)
LDL Chol Calc (NIH): 77 mg/dL (ref 0–99)
Triglycerides: 157 mg/dL — ABNORMAL HIGH (ref 0–149)
VLDL Cholesterol Cal: 27 mg/dL (ref 5–40)

## 2019-04-30 LAB — TSH: TSH: 3.97 u[IU]/mL (ref 0.450–4.500)

## 2019-04-30 LAB — HEMOGLOBIN A1C
Est. average glucose Bld gHb Est-mCnc: 123 mg/dL
Hgb A1c MFr Bld: 5.9 % — ABNORMAL HIGH (ref 4.8–5.6)

## 2019-04-30 LAB — VITAMIN D 25 HYDROXY (VIT D DEFICIENCY, FRACTURES): Vit D, 25-Hydroxy: 90.6 ng/mL (ref 30.0–100.0)

## 2019-04-30 LAB — T4, FREE: Free T4: 1.05 ng/dL (ref 0.82–1.77)

## 2019-05-02 ENCOUNTER — Encounter: Payer: Self-pay | Admitting: Family Medicine

## 2019-05-02 ENCOUNTER — Other Ambulatory Visit: Payer: Self-pay

## 2019-05-02 ENCOUNTER — Ambulatory Visit (INDEPENDENT_AMBULATORY_CARE_PROVIDER_SITE_OTHER): Payer: BC Managed Care – PPO | Admitting: Family Medicine

## 2019-05-02 DIAGNOSIS — F4321 Adjustment disorder with depressed mood: Secondary | ICD-10-CM | POA: Diagnosis not present

## 2019-05-02 DIAGNOSIS — F430 Acute stress reaction: Secondary | ICD-10-CM

## 2019-05-02 MED ORDER — DULOXETINE HCL 20 MG PO CPEP: 20.0000 mg | ORAL_CAPSULE | Freq: Every day | ORAL | 0 refills | Status: DC

## 2019-05-02 NOTE — Patient Instructions (Signed)
If you have insomnia or difficulty sleeping, this information is for you:  - Avoid caffeinated beverages after lunch,  no alcoholic beverages,  no eating within 2-3 hours of lying down,  avoid exposure to blue light before bed,  avoid daytime naps, and  needs to maintain a regular sleep schedule- go to sleep and wake up around the same time every night.   - Resolve concerns or worries before entering bedroom:  Discussed relaxation techniques with patient and to keep a journal to write down fears\ worries.  I suggested seeing a counselor for CBT.   - Recommend patient meditate or do deep breathing exercises to help relax.   Incorporate the use of white noise machines or listen to "sleep meditation music", or recordings of guided meditations for sleep from YouTube which are free, such as  "guided meditation for detachment from over thinking"  by Mayford Knife.      Risk factors for prediabetes and type 2 diabetes  Researchers don't fully understand why some people develop prediabetes and type 2 diabetes and others don't.  It's clear that certain factors increase the risk, however, including:  Weight. The more fatty tissue you have, the more resistant your cells become to insulin.  Inactivity. The less active you are, the greater your risk. Physical activity helps you control your weight, uses up glucose as energy and makes your cells more sensitive to insulin.  Family history. Your risk increases if a parent or sibling has type 2 diabetes.  Race. Although it's unclear why, people of certain races -- including blacks, Hispanics, American Indians and Asian-Americans -- are at higher risk.  Age. Your risk increases as you get older. This may be because you tend to exercise less, lose muscle mass and gain weight as you age. But type 2 diabetes is also increasing dramatically among children, adolescents and younger adults.  Gestational diabetes. If you developed gestational diabetes when you were  pregnant, your risk of developing prediabetes and type 2 diabetes later increases. If you gave birth to a baby weighing more than 9 pounds (4 kilograms), you're also at risk of type 2 diabetes.  Polycystic ovary syndrome. For women, having polycystic ovary syndrome -- a common condition characterized by irregular menstrual periods, excess hair growth and obesity -- increases the risk of diabetes.  High blood pressure. Having blood pressure over 140/90 millimeters of mercury (mm Hg) is linked to an increased risk of type 2 diabetes.  Abnormal cholesterol and triglyceride levels. If you have low levels of high-density lipoprotein (HDL), or "good," cholesterol, your risk of type 2 diabetes is higher. Triglycerides are another type of fat carried in the blood. People with high levels of triglycerides have an increased risk of type 2 diabetes. Your doctor can let you know what your cholesterol and triglyceride levels are.  A good guide to good carbs: The glycemic index ---If you have diabetes, or at risk for diabetes, you know all too well that when you eat carbohydrates, your blood sugar goes up. The total amount of carbs you consume at a meal or in a snack mostly determines what your blood sugar will do. But the food itself also plays a role. A serving of white rice has almost the same effect as eating pure table sugar -- a quick, high spike in blood sugar. A serving of lentils has a slower, smaller effect.  ---Picking good sources of carbs can help you control your blood sugar and your weight. Even if you don't  have diabetes, eating healthier carbohydrate-rich foods can help ward off a host of chronic conditions, from heart disease to various cancers to, well, diabetes.  ---One way to choose foods is with the glycemic index (GI). This tool measures how much a food boosts blood sugar.  The glycemic index rates the effect of a specific amount of a food on blood sugar compared with the same amount of pure  glucose. A food with a glycemic index of 28 boosts blood sugar only 28% as much as pure glucose. One with a GI of 95 acts like pure glucose.    High glycemic foods result in a quick spike in insulin and blood sugar (also known as blood glucose).  Low glycemic foods have a slower, smaller effect- these are healthier for you.   Using the glycemic index Using the glycemic index is easy: choose foods in the low GI category instead of those in the high GI category (see below), and go easy on those in between. Low glycemic index (GI of 55 or less): Most fruits and vegetables, beans, minimally processed grains, pasta, low-fat dairy foods, and nuts.  Moderate glycemic index (GI 56 to 69): White and sweet potatoes, corn, white rice, couscous, breakfast cereals such as Cream of Wheat and Mini Wheats.  High glycemic index (GI of 70 or higher): White bread, rice cakes, most crackers, bagels, cakes, doughnuts, croissants, most packaged breakfast cereals. You can see the values for 100 commons foods and get links to more at www.health.CheapToothpicks.si.  Swaps for lowering glycemic index  Instead of this high-glycemic index food Eat this lower-glycemic index food  White rice Brown rice or converted rice  Instant oatmeal Steel-cut oats  Cornflakes Bran flakes  Baked potato Pasta, bulgur  White bread Whole-grain bread  Corn Peas or leafy greens       Prediabetes Eating Plan  Prediabetes--also called impaired glucose tolerance or impaired fasting glucose--is a condition that causes blood sugar (blood glucose) levels to be higher than normal. Following a healthy diet can help to keep prediabetes under control. It can also help to lower the risk of type 2 diabetes and heart disease, which are increased in people who have prediabetes. Along with regular exercise, a healthy diet:  Promotes weight loss.  Helps to control blood sugar levels.  Helps to improve the way that the body uses  insulin.   WHAT DO I NEED TO KNOW ABOUT THIS EATING PLAN?   Use the glycemic index (GI) to plan your meals. The index tells you how quickly a food will raise your blood sugar. Choose low-GI foods. These foods take a longer time to raise blood sugar.  Pay close attention to the amount of carbohydrates in the food that you eat. Carbohydrates increase blood sugar levels.  Keep track of how many calories you take in. Eating the right amount of calories will help you to achieve a healthy weight. Losing about 7 percent of your starting weight can help to prevent type 2 diabetes.  You may want to follow a Mediterranean diet. This diet includes a lot of vegetables, lean meats or fish, whole grains, fruits, and healthy oils and fats.   WHAT FOODS CAN I EAT?  Grains Whole grains, such as whole-wheat or whole-grain breads, crackers, cereals, and pasta. Unsweetened oatmeal. Bulgur. Barley. Quinoa. Brown rice. Corn or whole-wheat flour tortillas or taco shells. Vegetables Lettuce. Spinach. Peas. Beets. Cauliflower. Cabbage. Broccoli. Carrots. Tomatoes. Squash. Eggplant. Herbs. Peppers. Onions. Cucumbers. Brussels sprouts. Fruits Berries. Bananas. Apples.  Oranges. Grapes. Papaya. Mango. Pomegranate. Kiwi. Grapefruit. Cherries. Meats and Other Protein Sources Seafood. Lean meats, such as chicken and [place] or lean cuts of pork and beef. Tofu. Eggs. Nuts. Beans. Dairy Low-fat or fat-free dairy products, such as yogurt, cottage cheese, and cheese. Beverages Water. Tea. Coffee. Sugar-free or diet soda. Seltzer water. Milk. Milk alternatives, such as soy or almond milk. Condiments Mustard. Relish. Low-fat, low-sugar ketchup. Low-fat, low-sugar barbecue sauce. Low-fat or fat-free mayonnaise. Sweets and Desserts Sugar-free or low-fat pudding. Sugar-free or low-fat ice cream and other frozen treats. Fats and Oils Avocado. Walnuts. Olive oil. The items listed above may not be a complete list of  recommended foods or beverages. Contact your dietitian for more options.    WHAT FOODS ARE NOT RECOMMENDED?  Grains Refined white flour and flour products, such as bread, pasta, snack foods, and cereals. Beverages Sweetened drinks, such as sweet iced tea and soda. Sweets and Desserts Baked goods, such as cake, cupcakes, pastries, cookies, and cheesecake. The items listed above may not be a complete list of foods and beverages to avoid. Contact your dietitian for more information.   This information is not intended to replace advice given to you by your health care provider. Make sure you discuss any questions you have with your health care provider.   Document Released: 05/13/2014 Document Reviewed: 05/13/2014 Elsevier Interactive Patient Education [employer].

## 2019-05-02 NOTE — Progress Notes (Signed)
Impression and Recommendations:    1. Grief reaction- loss of husband Paula Conway Aug 8th, 2020   2. Reaction, situational, acute, to stress     Grief reaction- loss of husband Paula Conway Aug 8th, 2020 - Plan: DULoxetine (CYMBALTA) 20 MG capsule  Reaction, situational, acute, to stress - Plan: DULoxetine (CYMBALTA) 20 MG capsule   Mood Management - Advised patient to take her Cymbalta on a regular basis- not prn.  - Cont fluoxetine as well  - To help with her grief management, encouraged patient to sequester her husband's belongings to a few specific places in the house.  - In addition to prescription intervention, reviewed the "spokes of the wheel" of mood and health management.  Stressed the importance of ongoing prudent habits, including regular exercise, appropriate sleep hygiene, healthful dietary habits, and prayer/meditation to relax.  - Strongly encouraged patient to engage in positive self-talk.  - Will continue to monitor.   Pain mgt:  - Told patient to take her Lyrica regularly BID for assistance with pain management as well - Continue management as discussed.  See med list. - Education provided to patient today and all questions answered.   Lab Review - Reviewed recent lab work (04/29/2019) in depth with patient today.  All lab work within normal limits unless otherwise noted.  Extensive education provided and all questions answered.   Prediabetes - A1c 5.9 last check three days ago, stable from 5.9 five months ago.   - Counseled patient on prevention of diabetes and discussed dietary and lifestyle modification as first line.    - Importance of low carb, heart-healthy diet discussed with patient in addition to regular aerobic exercise of 44mn 5d/week or more.   - Check FBS and 2 hours after the biggest meal of your day.  Keep log and bring in next OV for my review.     - We will continue to monitor and re-check as discussed.   Kidney Health - Worsened due to  use of NSAID's - Serum creatinine worsened to 1.01 from 0.80 one year ago. - GFR worsened to 59 from 79 one year ago.  - Patient reports use of NSAID's prior to lab work.  - Told patient to discontinue use of Advil/alleve/ibuprofen. - Encouraged patient to take tylenol for pain relief if needed. - Otherwise, for alleviation of arthritis pain, encouraged patient to exercise.  - To help preserve the health of the kidneys, encouraged patient to keep her blood pressure and blood sugar under control, engage in daily physical activity (especially a formal exercise routine), hydrate adequately, and avoid nephrotoxic substances.  - Will continue to monitor.   Hyperlipidemia - Last OV, increased Crestor to one 5 mg tablet nightly.  Last FLP obtained three days ago: - Triglycerides = 157, elevated, up from 118 four months ago. - HDL = 55, WNL, stable from 54 prior. - LDL = 77, WNL, improved from 113 four months ago.  - Cholesterol levels are currently improved from prior. - Pt will continue current treatment regimen.  See med list.  - Prudent dietary changes such as low saturated & trans fat diets for hyperlipidemia and low carb diets for hypertriglyceridemia discussed with patient.    - Encouraged patient to follow AHA guidelines for regular exercise and also engage in weight loss if BMI above 25.   - We will continue to monitor.   Vitamin D Deficiency - 90.6 last check three days ago, up from 61.3 one year ago. - Discussed  goal range of management 40-60.  - Advised patient to discontinue either daily or weekly Vitamin D supplementation during the summer. - In the winter, patient will resume daily OTC in addition to weekly.  - Continue management as established.  See med list.  - Will continue to monitor and re-check as discussed.   BMI Counseling - Body mass index is 30.71 kg/m Explained to patient what BMI refers to, and what it means medically.    Told patient to think about  it as a "medical risk stratification measurement" and how increasing BMI is associated with increasing risk/ or worsening state of various diseases such as hypertension, hyperlipidemia, diabetes, premature OA, depression etc.  American Heart Association guidelines for healthy diet, basically Mediterranean diet, and exercise guidelines of 30 minutes 5 days per week or more discussed in detail.  Health counseling performed.  All questions answered.   Health Counseling & Preventative Health Maintenance - Advised patient to continue working toward exercising to improve overall mental, physical, and emotional health.    - Encouraged patient to engage in daily physical activity as tolerated, especially a formal exercise routine.  Recommended that the patient eventually strive for at least 150-300 minutes of moderate cardiovascular activity per week according to guidelines established by the [facility].   - Healthy dietary habits encouraged, including low-carb, and high amounts of lean protein in diet.   - Patient should also consume adequate amounts of water.   Meds ordered this encounter  Medications  . DULoxetine (CYMBALTA) 20 MG capsule    Sig: Take 1 capsule (20 mg total) by mouth daily.    Dispense:  90 capsule    Refill:  0    Medications Discontinued During This Encounter  Medication Reason  . DULoxetine (CYMBALTA) 20 MG capsule Reorder      Please see AVS handed out to patient at the end of our visit for further patient instructions/ counseling done pertaining to today's office visit.   Return for f/up 4 months, re-check A1c, BMP.     Note:  This note was prepared with assistance of Dragon voice recognition software. Occasional wrong-word or sound-a-like substitutions may have occurred due to the inherent limitations of voice recognition software.   The [place] was signed into law in 2016 which includes the topic of electronic health records.  This provides immediate  access to information in MyChart.  This includes consultation notes, operative notes, office notes, lab results and pathology reports.  If you have any questions about what you read please let us know at your next visit or call us at the office.  We are right here with you.    This case required medical decision making of at least moderate complexity.  This document serves as a record of services personally performed by Mellody Dance, DO. It was created on her behalf by Toni Amend, a trained medical scribe. The creation of this record is based on the scribe's personal observations and the provider's statements to them.    The above documentation from Toni Amend, medical scribe, has been reviewed by Marjory Sneddon, D.O.      --------------------------------------------------------------------------------------------------------------------------------------------------------------------------------------------------------------------------------------------    Subjective:     Paula Conway, am serving as scribe for Dr.Carlus Stay.   HPI: Paula Conway is a 64 y.o. female who presents to [place] at [facility] today for issues as discussed below.  - Kidney Function Notes she has been taking Advil/ibuprofen/alleve lately.  - Vitamin D  Insufficiency Takes 5000 units of Vitamin D per day, and takes once weekly tablet as well.  - Mood Management Confirms that she feels stable overall in terms of mood.  Says "I've kind of cut down," on the use of Cymbalta and Prozac.  Says she knew she would have a meltdown today during this appointment, and "I don't want to [facility] meltdowns] in front of everybody."  When she takes a whole tablet, feels her mood/tendency to have a meltdown "seems to be worse" than if she takes less, reporting an incident recently.  "I guess I'm just taking it one day at a time.  When I feel I need em, I do [take them]."  She  typically takes her Cymbalta at night with her Crestor.  Feels overall things have gotten easier after the passing of her husband.  "I still haven't emptied the top drawer."  Notes that the closet still has his coats in it.  She's been shredding things and giving clothing and belongings away where applicable and continues moving forward.  HPI:  Hyperlipidemia:  64 y.o. female here for cholesterol follow-up.   - Patient reports good compliance with treatment plan of:  medication and/ or lifestyle management.   She feels her triglycerides have gone up due to eating sweets.   - Patient denies any acute concerns or problems with management plan   - She denies new onset of: myalgias, arthralgias, increased fatigue more than normal, chest pains, exercise intolerance, shortness of breath, dizziness, visual changes, headache, lower extremity swelling or claudication.   Most recent cholesterol panel was:  Lab Results  Component Value Date   CHOL 159 04/29/2019   HDL 55 04/29/2019   LDLCALC 77 04/29/2019   TRIG 157 (H) 04/29/2019   CHOLHDL 2.9 04/29/2019   Hepatic Function Latest Ref Rng & Units 04/29/2019 12/25/2018 01/26/2018  Total Protein 6.0 - 8.5 g/dL 6.7 - 7.1  Albumin 3.8 - 4.8 g/dL 4.5 - 4.7  AST 0 - 40 IU/L 18 - 24  ALT 0 - 32 IU/L 17 20 29   Alk Phosphatase 39 - 117 IU/L 86 - 77  Total Bilirubin 0.0 - 1.2 mg/dL 0.4 - 0.4      Wt Readings from Last 3 Encounters:  05/02/19 178 lb 14.4 oz (81.1 kg)  12/28/18 182 lb (82.6 kg)  11/16/18 192 lb 1.6 oz (87.1 kg)   BP Readings from Last 3 Encounters:  05/02/19 121/77  12/28/18 118/83  11/16/18 129/83   Pulse Readings from Last 3 Encounters:  05/02/19 62  12/28/18 65  11/16/18 71   BMI Readings from Last 3 Encounters:  05/02/19 30.71 kg/m  12/28/18 29.38 kg/m  11/16/18 31.01 kg/m     Patient Care Team    Relationship Specialty Notifications Start End  Mellody Dance, DO PCP - General Family Medicine  07/16/15       Patient Active Problem List   Diagnosis Date Noted  . Obesity, Class I, BMI 30-34.9 08/30/2016  . Prediabetes 07/30/2015  . Elevated LDL cholesterol level 07/30/2015  . Hyperlipidemia 07/20/2015  . Family history of breast cancer in Mother 07/20/2015  . GERD (gastroesophageal reflux disease) 07/20/2015  . Generalized OA 07/20/2015  . Environmental and seasonal allergies 07/20/2015  . Spinal stenosis: Chronic back pain  07/20/2015  . Statin declined 11/16/2018  . Hypertriglyceridemia 11/16/2018  . Grief reaction- loss of husband Paula Conway Aug 8th, 2020 10/02/2018  . Reaction, situational, acute, to stress 10/02/2018  . Dysfunction of both eustachian tubes 07/24/2017  .  BPPV (benign paroxysmal positional vertigo), unspecified laterality 07/24/2017  . Vitamin D insufficiency 03/15/2017  . Lumbar discogenic pain syndrome 02/15/2017  . Radiculopathy, lumbar region 02/15/2017  . Arthritis of facet joints at multiple vertebral levels 02/15/2017  . Low back pain 02/15/2017  . Glucose intolerance (impaired glucose tolerance) 09/14/2016  . Health education/counseling 08/30/2016  . Elevated TSH 07/30/2015  . Encounter for screening mammogram for breast cancer 07/16/2015  . Neoplasm of connective tissue 11/23/2011    Past Medical history, Surgical history, Family history, Social history, Allergies and Medications have been entered into the medical record, reviewed and changed as needed.    Current Meds  Medication Sig  . Ascorbic Acid (VITAMIN C) 1000 MG tablet Take 1,000 mg by mouth daily.  . Cholecalciferol (VITAMIN D3) 5000 units CAPS Take 5,000 Units by mouth daily.  . DULoxetine (CYMBALTA) 20 MG capsule Take 1 capsule (20 mg total) by mouth daily.  Marland Kitchen FLUoxetine (PROZAC) 10 MG tablet Use one half tablet daily for 6d; then 1 tablet daily (Patient taking differently: Use one half tablet daily for 6d; then 1 tablet daily  Only taking sometimes-says it causes her to have more 'break  downs')  . Glucos-Chond-Hyal Ac-Ca Fructo (MOVE FREE JOINT HEALTH ADVANCE PO) Take 1 tablet by mouth daily.  Marland Kitchen loratadine (CLARITIN) 10 MG tablet Take 10 mg by mouth daily.  . Magnesium 250 MG TABS Take 1 tablet by mouth daily.  . Multiple Vitamin (MULTIVITAMIN) tablet Take 1 tablet by mouth daily.  . pregabalin (LYRICA) 75 MG capsule Take 1 capsule (75 mg total) by mouth 2 (two) times daily. (Patient taking differently: Take 75 mg by mouth daily. )  . rosuvastatin (CRESTOR) 5 MG tablet 1 tab every night b-4 bed  . Vitamin D, Ergocalciferol, (DRISDOL) 1.25 MG (50000 UNIT) CAPS capsule TAKE 1 CAPSULE BY MOUTH EVERY 7 DAYS.  . [DISCONTINUED] DULoxetine (CYMBALTA) 20 MG capsule Take 1 capsule (20 mg total) by mouth daily. **PATIENT NEEDS APT FOR FURTHER REFILLS**    Allergies:  No Known Allergies   Review of Systems:  A fourteen system review of systems was performed and found to be positive as per HPI.   Objective:   Blood pressure 121/77, pulse 62, temperature 97.8 F (36.6 C), temperature source Oral, height 5' 4"  (1.626 m), weight 178 lb 14.4 oz (81.1 kg), SpO2 99 %. Body mass index is 30.71 kg/m. General:  Well Developed, well nourished, appropriate for stated age.  Neuro:  Alert and oriented,  extra-ocular muscles intact  HEENT:  Normocephalic, atraumatic, neck supple, no carotid bruits appreciated  Skin:  no gross rash, warm, pink. Cardiac:  RRR, S1 S2 Respiratory:  ECTA B/L and A/P, Not using accessory muscles, speaking in full sentences- unlabored. Vascular:  Ext warm, no cyanosis apprec.; cap RF less 2 sec. Psych:  No HI/SI, judgement and insight good, Euthymic mood. Full Affect.

## 2019-05-03 ENCOUNTER — Ambulatory Visit (INDEPENDENT_AMBULATORY_CARE_PROVIDER_SITE_OTHER): Payer: BC Managed Care – PPO | Admitting: Psychology

## 2019-05-03 DIAGNOSIS — F321 Major depressive disorder, single episode, moderate: Secondary | ICD-10-CM

## 2019-05-16 ENCOUNTER — Ambulatory Visit (INDEPENDENT_AMBULATORY_CARE_PROVIDER_SITE_OTHER): Payer: BC Managed Care – PPO | Admitting: Psychology

## 2019-05-16 DIAGNOSIS — F321 Major depressive disorder, single episode, moderate: Secondary | ICD-10-CM | POA: Diagnosis not present

## 2019-05-31 ENCOUNTER — Ambulatory Visit (INDEPENDENT_AMBULATORY_CARE_PROVIDER_SITE_OTHER): Payer: BC Managed Care – PPO | Admitting: Psychology

## 2019-05-31 DIAGNOSIS — F321 Major depressive disorder, single episode, moderate: Secondary | ICD-10-CM | POA: Diagnosis not present

## 2019-06-13 ENCOUNTER — Ambulatory Visit (INDEPENDENT_AMBULATORY_CARE_PROVIDER_SITE_OTHER): Payer: BC Managed Care – PPO | Admitting: Psychology

## 2019-06-13 DIAGNOSIS — F321 Major depressive disorder, single episode, moderate: Secondary | ICD-10-CM | POA: Diagnosis not present

## 2019-06-26 ENCOUNTER — Ambulatory Visit (INDEPENDENT_AMBULATORY_CARE_PROVIDER_SITE_OTHER): Payer: BC Managed Care – PPO | Admitting: Psychology

## 2019-06-26 DIAGNOSIS — F321 Major depressive disorder, single episode, moderate: Secondary | ICD-10-CM

## 2019-07-10 ENCOUNTER — Encounter (HOSPITAL_COMMUNITY): Payer: Self-pay | Admitting: Emergency Medicine

## 2019-07-10 ENCOUNTER — Emergency Department (HOSPITAL_COMMUNITY)
Admission: EM | Admit: 2019-07-10 | Discharge: 2019-07-10 | Disposition: A | Discharge status: Home / Self Care | Payer: BC Managed Care – PPO | Attending: Emergency Medicine | Admitting: Emergency Medicine

## 2019-07-10 ENCOUNTER — Other Ambulatory Visit: Payer: Self-pay

## 2019-07-10 ENCOUNTER — Emergency Department (HOSPITAL_COMMUNITY): Payer: BC Managed Care – PPO

## 2019-07-10 ENCOUNTER — Other Ambulatory Visit: Payer: Self-pay | Admitting: Physician Assistant

## 2019-07-10 DIAGNOSIS — R109 Unspecified abdominal pain: Secondary | ICD-10-CM | POA: Diagnosis present

## 2019-07-10 DIAGNOSIS — R112 Nausea with vomiting, unspecified: Secondary | ICD-10-CM | POA: Diagnosis not present

## 2019-07-10 DIAGNOSIS — E785 Hyperlipidemia, unspecified: Secondary | ICD-10-CM

## 2019-07-10 DIAGNOSIS — E781 Pure hyperglyceridemia: Secondary | ICD-10-CM

## 2019-07-10 DIAGNOSIS — N201 Calculus of ureter: Secondary | ICD-10-CM | POA: Insufficient documentation

## 2019-07-10 DIAGNOSIS — Z87891 Personal history of nicotine dependence: Secondary | ICD-10-CM | POA: Diagnosis not present

## 2019-07-10 LAB — CBC WITH DIFFERENTIAL/PLATELET
Abs Immature Granulocytes: 0.07 10*3/uL (ref 0.00–0.07)
Basophils Absolute: 0.1 10*3/uL (ref 0.0–0.1)
Basophils Relative: 0 %
Eosinophils Absolute: 0 10*3/uL (ref 0.0–0.5)
Eosinophils Relative: 0 %
HCT: 49.2 % — ABNORMAL HIGH (ref 36.0–46.0)
Hemoglobin: 15.9 g/dL — ABNORMAL HIGH (ref 12.0–15.0)
Immature Granulocytes: 0 %
Lymphocytes Relative: 7 %
Lymphs Abs: 1.2 10*3/uL (ref 0.7–4.0)
MCH: 30 pg (ref 26.0–34.0)
MCHC: 32.3 g/dL (ref 30.0–36.0)
MCV: 92.8 fL (ref 80.0–100.0)
Monocytes Absolute: 0.8 10*3/uL (ref 0.1–1.0)
Monocytes Relative: 5 %
Neutro Abs: 14.1 10*3/uL — ABNORMAL HIGH (ref 1.7–7.7)
Neutrophils Relative %: 88 %
Platelets: 232 10*3/uL (ref 150–400)
RBC: 5.3 MIL/uL — ABNORMAL HIGH (ref 3.87–5.11)
RDW: 12.9 % (ref 11.5–15.5)
WBC: 16.2 10*3/uL — ABNORMAL HIGH (ref 4.0–10.5)
nRBC: 0 % (ref 0.0–0.2)

## 2019-07-10 LAB — URINALYSIS, ROUTINE W REFLEX MICROSCOPIC
Bilirubin Urine: NEGATIVE
Glucose, UA: NEGATIVE mg/dL
Ketones, ur: 20 mg/dL — AB
Leukocytes,Ua: NEGATIVE
Nitrite: NEGATIVE
Protein, ur: 30 mg/dL — AB
Specific Gravity, Urine: 1.013 (ref 1.005–1.030)
pH: 9 — ABNORMAL HIGH (ref 5.0–8.0)

## 2019-07-10 LAB — COMPREHENSIVE METABOLIC PANEL
ALT: 25 U/L (ref 0–44)
AST: 34 U/L (ref 15–41)
Albumin: 4.9 g/dL (ref 3.5–5.0)
Alkaline Phosphatase: 78 U/L (ref 38–126)
Anion gap: 16 — ABNORMAL HIGH (ref 5–15)
BUN: 16 mg/dL (ref 8–23)
CO2: 16 mmol/L — ABNORMAL LOW (ref 22–32)
Calcium: 9.9 mg/dL (ref 8.9–10.3)
Chloride: 109 mmol/L (ref 98–111)
Creatinine, Ser: 1.08 mg/dL — ABNORMAL HIGH (ref 0.44–1.00)
GFR calc Af Amer: >60 mL/min (ref >60)
GFR calc non Af Amer: 54 mL/min — ABNORMAL LOW (ref >60)
Glucose, Bld: 116 mg/dL — ABNORMAL HIGH (ref 70–99)
Potassium: 4.3 mmol/L (ref 3.5–5.1)
Sodium: 141 mmol/L (ref 135–145)
Total Bilirubin: 1 mg/dL (ref 0.3–1.2)
Total Protein: 8.4 g/dL — ABNORMAL HIGH (ref 6.5–8.1)

## 2019-07-10 LAB — LIPASE, BLOOD: Lipase: 36 U/L (ref 11–51)

## 2019-07-10 MED ORDER — SODIUM CHLORIDE 0.9 % IV BOLUS
1000.0000 mL | Freq: Once | INTRAVENOUS | Status: AC
  Administered 2019-07-10: 1000 mL via INTRAVENOUS

## 2019-07-10 MED ORDER — ONDANSETRON 8 MG PO TBDP: 8.0000 mg | ORAL_TABLET | Freq: Three times a day (TID) | ORAL | 0 refills | Status: DC | PRN

## 2019-07-10 MED ORDER — ONDANSETRON HCL 4 MG/2ML IJ SOLN
4.0000 mg | Freq: Once | INTRAMUSCULAR | Status: AC
  Administered 2019-07-10: 4 mg via INTRAVENOUS
  Filled 2019-07-10: qty 2

## 2019-07-10 MED ORDER — HYDROCODONE-ACETAMINOPHEN 5-325 MG PO TABS: 1.0000 | ORAL_TABLET | Freq: Four times a day (QID) | ORAL | 0 refills | Status: DC | PRN

## 2019-07-10 MED ORDER — HYDROMORPHONE HCL 1 MG/ML IJ SOLN
1.0000 mg | Freq: Once | INTRAMUSCULAR | Status: AC
  Administered 2019-07-10: 1 mg via INTRAVENOUS
  Filled 2019-07-10: qty 1

## 2019-07-10 MED ORDER — SODIUM CHLORIDE 0.9 % IV SOLN: INTRAVENOUS | Status: DC

## 2019-07-10 NOTE — ED Provider Notes (Signed)
[facility] DEPT Provider Note   CSN: [id number] Arrival date & time: 07/10/19  7673     History Chief Complaint  Patient presents with  . Abdominal Pain  . Emesis    Paula Conway is a 64 y.o. female.  HPI   Patient presents emergency room with complaints of nausea vomiting and abdominal pain.  Patient states she started having symptoms this morning.  She had intense pain in her left side of her abdomen.  It went to the lower part of her abdomen as well as her flank.  The pain was sharp and intense.  Patient states she had several episodes of it becoming very severe.  She started having nausea and vomiting.  The last multiple episodes were dry heaves.  After the left episode of pain she started getting numbness and tingling in both her hands and her feet.  Patient denies any dysuria.  She does have intermittent constipation but that is typical for her nothing more than usual.  She denies any fevers or chills.  Past Medical History:  Diagnosis Date  . Allergy   . Arthritis   . Environmental and seasonal allergies 07/20/2015  . Family history of breast cancer in Mother 07/20/2015   Mother was diagnosed at 32 years old and died age 75 of breast cancer   . GERD (gastroesophageal reflux disease)   . Hyperlipidemia   . Obesity 07/20/2015  . Spinal stenosis     Patient Active Problem List   Diagnosis Date Noted  . Statin declined 11/16/2018  . Hypertriglyceridemia 11/16/2018  . Grief reaction- loss of husband [name] Aug 8th, 2020 10/02/2018  . Reaction, situational, acute, to stress 10/02/2018  . Dysfunction of both eustachian tubes 07/24/2017  . BPPV (benign paroxysmal positional vertigo), unspecified laterality 07/24/2017  . Vitamin D insufficiency 03/15/2017  . Lumbar discogenic pain syndrome 02/15/2017  . Radiculopathy, lumbar region 02/15/2017  . Arthritis of facet joints at multiple vertebral levels 02/15/2017  . Low back pain 02/15/2017  .  Glucose intolerance (impaired glucose tolerance) 09/14/2016  . Obesity, Class I, BMI 30-34.9 08/30/2016  . Health education/counseling 08/30/2016  . Prediabetes 07/30/2015  . Elevated LDL cholesterol level 07/30/2015  . Elevated TSH 07/30/2015  . Family history of breast cancer in Mother 07/20/2015  . GERD (gastroesophageal reflux disease) 07/20/2015  . Generalized OA 07/20/2015  . Hyperlipidemia 07/20/2015  . Environmental and seasonal allergies 07/20/2015  . Spinal stenosis: Chronic back pain  07/20/2015  . Encounter for screening mammogram for breast cancer 07/16/2015  . Neoplasm of connective tissue 11/23/2011    Past Surgical History:  Procedure Laterality Date  . APPENDECTOMY    . BREAST BIOPSY Right   . core biopsy       OB History   No obstetric history on file.     Family History  Problem Relation Age of Onset  . Breast cancer Mother   . Diabetes Father   . Hypertension Father   . Heart disease Father     Social History   Tobacco Use  . Smoking status: Former Smoker    Packs/day: 0.25    Years: 3.00    Pack years: 0.75    Quit date: 01/11/1975    Years since quitting: 44.5  . Smokeless tobacco: Never Used  Vaping Use  . Vaping Use: Never used  Substance Use Topics  . Alcohol use: Yes    Comment: socially  . Drug use: No    Home Medications Prior  to Admission medications   Medication Sig Start Date End Date Taking? Authorizing Provider  Ascorbic Acid (VITAMIN C) 1000 MG tablet Take 1,000 mg by mouth daily.   Yes [provider]  Cholecalciferol (VITAMIN D3) 5000 units CAPS Take 5,000 Units by mouth daily.   Yes [provider]  DULoxetine (CYMBALTA) 20 MG capsule Take 1 capsule (20 mg total) by mouth daily. 05/02/19  Yes Opalski, Neoma Laming, DO  loratadine (CLARITIN) 10 MG tablet Take 10 mg by mouth daily.   Yes [provider]  Multiple Vitamin (MULTIVITAMIN) tablet Take 1 tablet by mouth daily.   Yes [provider]    pregabalin (LYRICA) 75 MG capsule Take 1 capsule (75 mg total) by mouth 2 (two) times daily. Patient taking differently: Take 75 mg by mouth daily.  10/02/18  Yes Opalski, Deborah, DO  Probiotic Product (PROBIOTIC PO) Take 1 tablet by mouth daily.   Yes [provider]  rosuvastatin (CRESTOR) 5 MG tablet 1 tab every night b-4 bed Patient taking differently: Take 5 mg by mouth at bedtime.  12/28/18  Yes Opalski, Neoma Laming, DO  FLUoxetine (PROZAC) 10 MG tablet Use one half tablet daily for 6d; then 1 tablet daily Patient not taking: Reported on 07/10/2019 11/16/18   Mellody Dance, DO  Glucos-Chond-Hyal Ac-Ca Fructo (MOVE FREE JOINT HEALTH ADVANCE PO) Take 1 tablet by mouth daily.    [provider]  HYDROcodone-acetaminophen (NORCO/VICODIN) 5-325 MG tablet Take 1-2 tablets by mouth every 6 (six) hours as needed. 07/10/19   Dorie Rank, MD  ondansetron (ZOFRAN ODT) 8 MG disintegrating tablet Take 1 tablet (8 mg total) by mouth every 8 (eight) hours as needed for nausea or vomiting. 07/10/19   Dorie Rank, MD  Turmeric (QC TUMERIC COMPLEX PO) Take 1 tablet by mouth once.    [provider]  Vitamin D, Ergocalciferol, (DRISDOL) 1.25 MG (50000 UNIT) CAPS capsule TAKE 1 CAPSULE BY MOUTH EVERY 7 DAYS. Patient not taking: Reported on 07/10/2019 02/06/19   Mellody Dance, DO    Allergies    Patient has no known allergies.  Review of Systems   Review of Systems  All other systems reviewed and are negative.   Physical Exam Updated Vital Signs BP (!) 166/81 (BP Location: Left Arm)   Pulse 63   Temp 97.7 F (36.5 C) (Oral)   Resp 17   Ht 1.626 m (5\' 4" )   Wt 82 kg   SpO2 100%   BMI 31.03 kg/m   Physical Exam Vitals and nursing note reviewed.  Constitutional:      General: She is not in acute distress.    Appearance: She is well-developed.  HENT:     Head: Normocephalic and atraumatic.     Right Ear: External ear normal.     Left Ear: External ear normal.  Eyes:      General: No scleral icterus.       Right eye: No discharge.        Left eye: No discharge.     Conjunctiva/sclera: Conjunctivae normal.  Neck:     Trachea: No tracheal deviation.  Cardiovascular:     Rate and Rhythm: Normal rate and regular rhythm.  Pulmonary:     Effort: Pulmonary effort is normal. No respiratory distress.     Breath sounds: Normal breath sounds. No stridor. No wheezing or rales.  Abdominal:     General: Bowel sounds are normal. There is no distension.     Palpations: Abdomen is soft. There  is no mass or pulsatile mass.     Tenderness: There is abdominal tenderness in the left upper quadrant and left lower quadrant. There is left CVA tenderness. There is no guarding or rebound.     Hernia: No hernia is present. There is no hernia in the left inguinal area or right inguinal area.     Comments: No pulsatile mass appreciated  Musculoskeletal:        General: No tenderness.     Cervical back: Neck supple.     Comments: Normal pulses and perfusion distally  Skin:    General: Skin is warm and dry.     Findings: No rash.  Neurological:     Mental Status: She is alert.     Cranial Nerves: No cranial nerve deficit (no facial droop, extraocular movements intact, no slurred speech).     Sensory: No sensory deficit.     Motor: No abnormal muscle tone or seizure activity.     Coordination: Coordination normal.     Comments: 5 out of 5 strength bilateral plantar flexion and dorsiflexion, sensation intact bilaterally     ED Results / Procedures / Treatments   Labs (all labs ordered are listed, but only abnormal results are displayed) Labs Reviewed  COMPREHENSIVE METABOLIC PANEL - Abnormal; Notable for the following components:      Result Value   CO2 16 (*)    Glucose, Bld 116 (*)    Creatinine, Ser 1.08 (*)    Total Protein 8.4 (*)    GFR calc non Af Amer 54 (*)    Anion gap 16 (*)    All other components within normal limits  CBC WITH DIFFERENTIAL/PLATELET -  Abnormal; Notable for the following components:   WBC 16.2 (*)    RBC 5.30 (*)    Hemoglobin 15.9 (*)    HCT 49.2 (*)    Neutro Abs 14.1 (*)    All other components within normal limits  URINALYSIS, ROUTINE W REFLEX MICROSCOPIC - Abnormal; Notable for the following components:   pH 9.0 (*)    Hgb urine dipstick SMALL (*)    Ketones, ur 20 (*)    Protein, ur 30 (*)    Bacteria, UA RARE (*)    All other components within normal limits  LIPASE, BLOOD    EKG None  Radiology CT ABDOMEN PELVIS WO CONTRAST  Result Date: 07/10/2019 CLINICAL DATA:  Left lower quadrant abdominal pain and left flank pain with nausea and vomiting since early this morning. EXAM: CT ABDOMEN AND PELVIS WITHOUT CONTRAST TECHNIQUE: Multidetector CT imaging of the abdomen and pelvis was performed following the standard protocol without IV contrast. COMPARISON:  None. FINDINGS: Lower chest: Small hiatal hernia.  Otherwise negative. Hepatobiliary: Numerous gallstones. Gallbladder wall is not thickened and the gallbladder is not distended. No biliary ductal dilatation. Liver parenchyma is normal except for an 8 mm cyst in the caudate lobe. Pancreas: Unremarkable. No pancreatic ductal dilatation or surrounding inflammatory changes. Spleen: Normal in size without focal abnormality. Adrenals/Urinary Tract: Left hydronephrosis with a tiny stone in the left ureterovesical junction. Perinephric soft tissue stranding on the left. No left renal calculi. 12 mm exophytic cyst in the mid right kidney. Right kidney is otherwise normal. Bladder is normal. Stomach/Bowel: Small hiatal hernia. Scattered diverticula in the left side of the colon without diverticulitis. The bowel is otherwise normal including the terminal ileum and appendix. Vascular/Lymphatic: No significant vascular findings are present. No enlarged abdominal or pelvic lymph nodes. Reproductive: Uterus  and bilateral adnexa are unremarkable. Other: No abdominal wall hernia or  abnormality. No abdominopelvic ascites. Musculoskeletal: No acute abnormality. Minimal arthritic changes of both hips. Multilevel degenerative disc and joint disease in the lumbar spine. IMPRESSION: 1. Left hydronephrosis with a tiny stone in the left ureterovesical junction. 2. Cholelithiasis. 3. Small hiatal hernia. 4. Diverticulosis of the left side of the colon. Electronically Signed   By: Lorriane Shire M.D.   On: 07/10/2019 13:58    Procedures Procedures (including critical care time)  Medications Ordered in ED Medications  sodium chloride 0.9 % bolus 1,000 mL (1,000 mLs Intravenous Bolus from Bag 07/10/19 1133)    And  0.9 %  sodium chloride infusion (has no administration in time range)  HYDROmorphone (DILAUDID) injection 1 mg (1 mg Intravenous Given 07/10/19 1133)  ondansetron (ZOFRAN) injection 4 mg (4 mg Intravenous Given 07/10/19 1133)    ED Course  I have reviewed the triage vital signs and the nursing notes.  Pertinent labs & imaging results that were available during my care of the patient were reviewed by me and considered in my medical decision making (see chart for details).  Clinical Course as of Jul 10 1435  Wed Jul 09, 3568  1779 Metabolic panel shows decreased bicarb.  Increased anion gap.  Urinalysis does show 6-10 red blood cells and 6-10 white blood cells   [JK]  1410 CT scan demonstrates left ureteral stone with hydronephrosis   [JK]  1411 Patient does have incidental gallstones and hiatal hernia   [JK]    Clinical Course User Index [JK] Dorie Rank, MD   MDM Rules/Calculators/A&P                          Patient presented to the ED for evaluation of abdominal pain.  Symptoms are concerning for the possibility of ureteral colic, diverticulitis, colitis.  Patient mentioned difficulty with numbness but I suspect this was related to hyperventilation from her pain.  Patient's laboratory testing reassuring.  CT scan was performed and it does demonstrate a  left-sided ureteral stone.  Patient symptoms improved with treatment.  She is no longer having any pain.  Will discharge home with pain medications and antiemetics.  Outpatient follow-up with urology. Final Clinical Impression(s) / ED Diagnoses Final diagnoses:  Ureteral stone    Rx / DC Orders ED Discharge Orders         Ordered    ondansetron (ZOFRAN ODT) 8 MG disintegrating tablet  Every 8 hours PRN     Discontinue  Reprint     07/10/19 1436    HYDROcodone-acetaminophen (NORCO/VICODIN) 5-325 MG tablet  Every 6 hours PRN     Discontinue  Reprint     07/10/19 1436           Dorie Rank, MD 07/10/19 1439

## 2019-07-10 NOTE — ED Triage Notes (Signed)
Arrives via EMS from home, C/C N/V with constipation and leg numbness since 5 am. Pain starts in LLQ and radiates up the abdomen.  EMS gave 4mg  Zofran, 300 cc NS. 22 G RH.  Vitals  BP 120/78 HR 64 CBG 188 O2 100% RA

## 2019-07-10 NOTE — ED Notes (Signed)
An After Visit Summary was printed and given to the patient. Discharge instructions given and no further questions at this time.  

## 2019-07-10 NOTE — Discharge Instructions (Signed)
Use the urine strainer to help determine if you pass the stone.  Take the medications as needed for pain and nausea.  Follow-up with urologist if the symptoms persist.  Return to the ER for severe pain, fever or other concerns

## 2019-07-11 ENCOUNTER — Ambulatory Visit (INDEPENDENT_AMBULATORY_CARE_PROVIDER_SITE_OTHER): Payer: BC Managed Care – PPO | Admitting: Psychology

## 2019-07-11 DIAGNOSIS — F321 Major depressive disorder, single episode, moderate: Secondary | ICD-10-CM

## 2019-07-24 ENCOUNTER — Ambulatory Visit (INDEPENDENT_AMBULATORY_CARE_PROVIDER_SITE_OTHER): Payer: BC Managed Care – PPO | Admitting: Psychology

## 2019-07-24 DIAGNOSIS — F321 Major depressive disorder, single episode, moderate: Secondary | ICD-10-CM

## 2019-08-14 ENCOUNTER — Ambulatory Visit (INDEPENDENT_AMBULATORY_CARE_PROVIDER_SITE_OTHER): Payer: BC Managed Care – PPO | Admitting: Psychology

## 2019-08-14 DIAGNOSIS — F321 Major depressive disorder, single episode, moderate: Secondary | ICD-10-CM | POA: Diagnosis not present

## 2019-08-29 ENCOUNTER — Ambulatory Visit (INDEPENDENT_AMBULATORY_CARE_PROVIDER_SITE_OTHER): Payer: BC Managed Care – PPO | Admitting: Psychology

## 2019-08-29 DIAGNOSIS — F321 Major depressive disorder, single episode, moderate: Secondary | ICD-10-CM

## 2019-09-02 ENCOUNTER — Encounter: Payer: Self-pay | Admitting: Physician Assistant

## 2019-09-02 ENCOUNTER — Ambulatory Visit (INDEPENDENT_AMBULATORY_CARE_PROVIDER_SITE_OTHER): Payer: BC Managed Care – PPO | Admitting: Physician Assistant

## 2019-09-02 ENCOUNTER — Other Ambulatory Visit: Payer: Self-pay

## 2019-09-02 VITALS — BP 105/69 | HR 65 | Temp 97.6°F | Ht 64.0 in | Wt 177.0 lb

## 2019-09-02 DIAGNOSIS — R7303 Prediabetes: Secondary | ICD-10-CM | POA: Diagnosis not present

## 2019-09-02 DIAGNOSIS — F4321 Adjustment disorder with depressed mood: Secondary | ICD-10-CM

## 2019-09-02 DIAGNOSIS — E785 Hyperlipidemia, unspecified: Secondary | ICD-10-CM

## 2019-09-02 DIAGNOSIS — M4800 Spinal stenosis, site unspecified: Secondary | ICD-10-CM

## 2019-09-02 DIAGNOSIS — F430 Acute stress reaction: Secondary | ICD-10-CM | POA: Diagnosis not present

## 2019-09-02 DIAGNOSIS — M5126 Other intervertebral disc displacement, lumbar region: Secondary | ICD-10-CM

## 2019-09-02 DIAGNOSIS — M5416 Radiculopathy, lumbar region: Secondary | ICD-10-CM

## 2019-09-02 MED ORDER — DULOXETINE HCL 20 MG PO CPEP: 20.0000 mg | ORAL_CAPSULE | Freq: Every day | ORAL | 0 refills | Status: DC

## 2019-09-02 MED ORDER — PREGABALIN 75 MG PO CAPS: 75.0000 mg | ORAL_CAPSULE | Freq: Two times a day (BID) | ORAL | 1 refills | Status: DC

## 2019-09-02 NOTE — Patient Instructions (Signed)
Preventing Chronic Kidney Disease Chronic kidney disease (CKD) occurs when the kidneys are damaged for at least 3 months and do not function effectively. The kidneys are two organs that do many important jobs in the body, including:  Removing waste and extra fluid from the blood to make urine.  Making hormones that maintain the amount of fluid in tissues and blood vessels.  Maintaining the right amount of fluids and electrolytes in the body. A small amount of kidney damage may not cause problems, but a large amount of damage may make it hard or impossible for the kidneys to work the way they should. CKD gets worse over time (is progressive). You can take steps to prevent CKD or to keep it from getting worse. The best way to prevent kidney damage is to know your risk factors and make changes before you develop symptoms of CKD. How can this condition affect me? At first, you may not notice any signs or symptoms of CKD. Symptoms develop slowly and may not be obvious until the kidney damage becomes severe. If steps are not taken to prevent or slow down the disease, CKD can lead to:  A low red blood cell count (anemia).  Heart disease.  Weak bones.  Nerve damage (neuropathy).  Stroke.  Kidney failure and dialysis. What can increase my risk? You are more likely to develop CKD if you:  Are 50 years of age or older.  Are female.  Are of African American, Native American, Hispanic, Asian, or [place] descent.  Are obese.  Have taken certain medicines for a long time.  Use tobacco or have used it in the past.  Have any of the following conditions: ? Diabetes. ? High blood pressure. ? Heart disease. ? Multiple myeloma. ? An autoimmune disease. ? Frequent urinary tract infections. ? Polycystic kidney disease.  Have a family history of kidney disease, heart disease, diabetes, or high blood pressure.  Have problems with urine flow that may be caused  by: ? Cancer. ? Having kidney stones more than once. ? An enlarged prostate in males. What actions can I take to prevent CKD? Managing conditions that put you at risk  Talk to your health care provider about your kidney health and your risk factors for CKD.  Work with your health care provider to manage conditions such as high blood pressure or diabetes. This may involve taking medicines, eating healthy, or making lifestyle changes to: ? Get high blood pressure down to the target that your health care provider recommends. ? Get blood sugar (glucose) levels down to the target that your health care provider recommends. Eating and drinking   Follow instructions from your health care provider about diet. This may include: ? Limiting salt (sodium) intake. You should have less than 1 tsp (2,300 mg) of sodium a day. If you have heart disease or high blood pressure, you should have less than  tsp (1,500 mg) of sodium a day. ? Limiting protein intake as told by your health care provider. Avoid high-protein foods. ? Eating a balanced, heart-healthy diet. ? Avoiding foods that are high in potassium and phosphorous.  Limit alcohol. If you drink alcohol: ? Limit how much you use to:  0-1 drink a day for nonpregnant women.  0-2 drinks a day for men. ? Be aware of how much alcohol is in your drink. In the [place] one drink equals one 12 oz bottle of beer (355 mL), one 5 oz glass of wine (148 mL), or one  1 oz glass of hard liquor (44 mL).  If you have diabetes, work with a Financial planner (Firefighter) or a certified diabetes educator to develop a healthy eating plan.  Talk with your health care provider about how much fluid you should drink each day. Lifestyle   Exercise for at least 30 minutes on 5 or more days of the week, or as much as told by your health care provider.  Keep your weight at a healthy level. If you are overweight or obese, lose weight as told by your health care  provider.  Do not use any products that contain nicotine or tobacco, such as cigarettes, e-cigarettes, and chewing tobacco. If you need help quitting, ask your health care provider. General instructions  Take over-the-counter and prescription medicines only as told by your health care provider. Do not take any new medicines unless approved by your health care provider.  Use NSAIDs for pain only when necessary. Ask your health care provider about other pain medicines that do not increase your risk of developing CKD.  Have a yearly physical exam.  Learn about your family's medical history. Talk to your relatives and siblings about diabetes, heart disease, and high blood pressure. Where to find more information Learn more about CKD and how to prevent CKD from:  [place]: www.kidney.org  American Association of Kidney Patients: BombTimer.gl  American Diabetes Association: www.diabetes.org Summary  Symptoms of CKD develop slowly and may not be obvious until the kidney damage becomes severe.  The best way to prevent kidney damage is to know your risk factors and make nutrition and lifestyle changes before you develop symptoms of CKD.  Follow instructions from your health care provider about diet, which may include limiting how much salt, protein, and alcohol you consume.  Work with your health care provider to keep your blood pressure and blood sugar levels within the recommended range. This information is not intended to replace advice given to you by your health care provider. Make sure you discuss any questions you have with your health care provider. Document Revised: 04/20/2018 Document Reviewed: 01/31/2018 Elsevier Patient Education  [place].

## 2019-09-02 NOTE — Progress Notes (Signed)
Established Patient Office Visit  Subjective:  Patient ID: Paula Conway, female    DOB: 03/02/1955  Age: 64 y.o. MRN: [id number]  CC:  Chief Complaint  Patient presents with  . Hyperlipidemia  . Thyroid Problem    HPI Paula Conway presents for chronic follow-up on prediabetes, hyperlipidemia and mood management.  Prediabetes: Denies increased thirst or urination.  HLD: Pt taking medication as directed without issues. Denies side effects including myalgias and RUQ pain. She does water aerobics.  Mood: Reports she doesn't take Prozac everyday, usually only as needed. August and September are tough months because of her husband's passing away and their wedding anniversary. She continues to see a therapist and has sessions about every 2-3 weeks. She have strong support system.  Spinal stenosis: States she has chronic back pain.  States she has seen [facility] and had steroid injections, which she is reluctant to get done again due to history of contamination.  Reports occasionally takes ibuprofen for severe pain in addition to her daily medications.  Requesting refill of Lyrica. She has worked on proper posture and back exercises.   Past Medical History:  Diagnosis Date  . Allergy   . Arthritis   . Environmental and seasonal allergies 07/20/2015  . Family history of breast cancer in Mother 07/20/2015   Mother was diagnosed at 22 years old and died age 64 of breast cancer   . GERD (gastroesophageal reflux disease)   . Hyperlipidemia   . Obesity 07/20/2015  . Spinal stenosis     Past Surgical History:  Procedure Laterality Date  . APPENDECTOMY    . BREAST BIOPSY Right   . core biopsy      Family History  Problem Relation Age of Onset  . Breast cancer Mother   . Diabetes Father   . Hypertension Father   . Heart disease Father     Social History   Socioeconomic History  . Marital status: Married    Spouse name: Not on file  . Number of children: Not on  file  . Years of education: Not on file  . Highest education level: Not on file  Occupational History  . Not on file  Tobacco Use  . Smoking status: Former Smoker    Packs/day: 0.25    Years: 3.00    Pack years: 0.75    Quit date: 01/11/1975    Years since quitting: 44.6  . Smokeless tobacco: Never Used  Vaping Use  . Vaping Use: Never used  Substance and Sexual Activity  . Alcohol use: Yes    Comment: socially  . Drug use: No  . Sexual activity: Yes  Other Topics Concern  . Not on file  Social History Narrative  . Not on file   Social Determinants of Health   Financial Resource Strain:   . Difficulty of Paying Living Expenses: Not on file  Food Insecurity:   . Worried About Charity fundraiser in the Last Year: Not on file  . Ran Out of Food in the Last Year: Not on file  Transportation Needs:   . Lack of Transportation (Medical): Not on file  . Lack of Transportation (Non-Medical): Not on file  Physical Activity:   . Days of Exercise per Week: Not on file  . Minutes of Exercise per Session: Not on file  Stress:   . Feeling of Stress : Not on file  Social Connections:   . Frequency of Communication with Friends and Family: Not  on file  . Frequency of Social Gatherings with Friends and Family: Not on file  . Attends Religious Services: Not on file  . Active Member of Clubs or Organizations: Not on file  . Attends Archivist Meetings: Not on file  . Marital Status: Not on file  Intimate Partner Violence:   . Fear of Current or Ex-Partner: Not on file  . Emotionally Abused: Not on file  . Physically Abused: Not on file  . Sexually Abused: Not on file    Outpatient Medications Prior to Visit  Medication Sig Dispense Refill  . Ascorbic Acid (VITAMIN C) 1000 MG tablet Take 1,000 mg by mouth daily.    . Cholecalciferol (VITAMIN D3) 5000 units CAPS Take 5,000 Units by mouth daily.    Marland Kitchen FLUoxetine (PROZAC) 10 MG tablet Use one half tablet daily for 6d; then 1  tablet daily 90 tablet 3  . loratadine (CLARITIN) 10 MG tablet Take 10 mg by mouth daily.    . Multiple Vitamin (MULTIVITAMIN) tablet Take 1 tablet by mouth daily.    . pregabalin (LYRICA) 75 MG capsule Take 1 capsule (75 mg total) by mouth 2 (two) times daily. (Patient taking differently: Take 75 mg by mouth daily. ) 180 capsule 1  . Probiotic Product (PROBIOTIC PO) Take 1 tablet by mouth daily.    . rosuvastatin (CRESTOR) 5 MG tablet Take 1 tablet (5 mg total) by mouth at bedtime. TAKE 1 TABLET BY MOUTH EVERY NIGHT BEFORE BED 90 tablet 0  . Turmeric (QC TUMERIC COMPLEX PO) Take 1 tablet by mouth once.    . DULoxetine (CYMBALTA) 20 MG capsule Take 1 capsule (20 mg total) by mouth daily. 90 capsule 0  . Glucos-Chond-Hyal Ac-Ca Fructo (MOVE FREE JOINT HEALTH ADVANCE PO) Take 1 tablet by mouth daily.    Marland Kitchen HYDROcodone-acetaminophen (NORCO/VICODIN) 5-325 MG tablet Take 1-2 tablets by mouth every 6 (six) hours as needed. (Patient not taking: Reported on 09/02/2019) 12 tablet 0  . ondansetron (ZOFRAN ODT) 8 MG disintegrating tablet Take 1 tablet (8 mg total) by mouth every 8 (eight) hours as needed for nausea or vomiting. (Patient not taking: Reported on 09/02/2019) 12 tablet 0  . Vitamin D, Ergocalciferol, (DRISDOL) 1.25 MG (50000 UNIT) CAPS capsule TAKE 1 CAPSULE BY MOUTH EVERY 7 DAYS. (Patient not taking: Reported on 07/10/2019) 12 capsule 10   No facility-administered medications prior to visit.    No Known Allergies  ROS Review of Systems  A fourteen system review of systems was performed and found to be positive as per HPI.   Objective:    Physical Exam General:  Well Developed, well nourished, appropriate for stated age.  Neuro:  Alert and oriented,  extra-ocular muscles intact  HEENT:  Normocephalic, atraumatic, neck supple Skin:  no gross rash, warm, pink. Cardiac:  RRR, S1 S2 Respiratory:  ECTA B/L and A/P, Not using accessory muscles, speaking in full sentences- unlabored. Vascular:   Ext warm, no cyanosis apprec.; cap RF less 2 sec. Psych:  No HI/SI, judgement and insight good, Euthymic mood. Full Affect.   BP 105/69   Pulse 65   Temp 97.6 F (36.4 C) (Oral)   Ht 5' 4"  (1.626 m)   Wt 177 lb (80.3 kg)   SpO2 98%   BMI 30.38 kg/m  Wt Readings from Last 3 Encounters:  09/02/19 177 lb (80.3 kg)  07/10/19 180 lb 12.4 oz (82 kg)  05/02/19 178 lb 14.4 oz (81.1 kg)     Health  Maintenance Due  Topic Date Due  . COVID-19 Vaccine (1) Never done  . COLONOSCOPY  06/20/2018  . INFLUENZA VACCINE  08/11/2019  . PAP SMEAR-Modifier  08/31/2019    There are no preventive care reminders to display for this patient.  Lab Results  Component Value Date   TSH 3.970 04/29/2019   Lab Results  Component Value Date   WBC 16.2 (H) 07/10/2019   HGB 15.9 (H) 07/10/2019   HCT 49.2 (H) 07/10/2019   MCV 92.8 07/10/2019   PLT 232 07/10/2019   Lab Results  Component Value Date   NA 141 07/10/2019   K 4.3 07/10/2019   CO2 16 (L) 07/10/2019   GLUCOSE 116 (H) 07/10/2019   BUN 16 07/10/2019   CREATININE 1.08 (H) 07/10/2019   BILITOT 1.0 07/10/2019   ALKPHOS 78 07/10/2019   AST 34 07/10/2019   ALT 25 07/10/2019   PROT 8.4 (H) 07/10/2019   ALBUMIN 4.9 07/10/2019   CALCIUM 9.9 07/10/2019   ANIONGAP 16 (H) 07/10/2019   Lab Results  Component Value Date   CHOL 159 04/29/2019   Lab Results  Component Value Date   HDL 55 04/29/2019   Lab Results  Component Value Date   LDLCALC 77 04/29/2019   Lab Results  Component Value Date   TRIG 157 (H) 04/29/2019   Lab Results  Component Value Date   CHOLHDL 2.9 04/29/2019   Lab Results  Component Value Date   HGBA1C 5.9 (H) 04/29/2019      Assessment & Plan:   Problem List Items Addressed This Visit      Other   Hyperlipidemia - Primary (Chronic)   Relevant Orders   Comp Met (CMET)   Lipid Profile   CBC w/Diff   Prediabetes (Chronic)   Relevant Orders   CBC w/Diff   HgB A1c   Grief reaction- loss of  husband [name] Aug 8th, 2020   Relevant Medications   DULoxetine (CYMBALTA) 20 MG capsule   Reaction, situational, acute, to stress   Relevant Medications   DULoxetine (CYMBALTA) 20 MG capsule     Grief reaction-loss of husband, acute situational reaction to stress: -Stable, PHQ-9 score of 3 -Continue with behavioral health therapy sessions. -Discussed with patient compliance with medication in order to reach full therapeutic response, and if unable to tolerate Prozac or feels like mood is worsening should let me know and will change medication.  Patient verbalized understanding and agreeable. -Encourage to use good support system and incorporate nonpharmacological therapy such as relaxation techniques and exercise. -Will continue to monitor.  Prediabetes: -Last A1c 5.9, asymptomatic -Follow low carbohydrate and glucose diet. -Will recheck A1c today.  Hyperlipidemia: -Last lipid panel: Total cholesterol 159, triglycerides 157, HDL 55, LDL 77 -Continue Crestor 5 mg. -Follow heart healthy diet and continue to stay active. -Rechecking lipid panel hepatic function today.  Spinal stenosis: -Stable -Continue current medication regimen.  Provided requested refills. -Continue with water aerobics and stretching exercises. -Will continue to monitor.   Meds ordered this encounter  Medications  . DULoxetine (CYMBALTA) 20 MG capsule    Sig: Take 1 capsule (20 mg total) by mouth daily.    Dispense:  90 capsule    Refill:  0    Follow-up: Return for PreDM, Mood, HLD.    Lorrene Reid, PA-C

## 2019-09-03 LAB — COMPREHENSIVE METABOLIC PANEL
ALT: 15 IU/L (ref 0–32)
AST: 16 IU/L (ref 0–40)
Albumin/Globulin Ratio: 1.9 (ref 1.2–2.2)
Albumin: 4.5 g/dL (ref 3.8–4.8)
Alkaline Phosphatase: 79 IU/L (ref 48–121)
BUN/Creatinine Ratio: 15 (ref 12–28)
BUN: 14 mg/dL (ref 8–27)
Bilirubin Total: 0.3 mg/dL (ref 0.0–1.2)
CO2: 24 mmol/L (ref 20–29)
Calcium: 9.6 mg/dL (ref 8.7–10.3)
Chloride: 106 mmol/L (ref 96–106)
Creatinine, Ser: 0.91 mg/dL (ref 0.57–1.00)
GFR calc Af Amer: 77 mL/min/{1.73_m2} (ref >59)
GFR calc non Af Amer: 67 mL/min/{1.73_m2} (ref >59)
Globulin, Total: 2.4 g/dL (ref 1.5–4.5)
Glucose: 95 mg/dL (ref 65–99)
Potassium: 4.5 mmol/L (ref 3.5–5.2)
Sodium: 143 mmol/L (ref 134–144)
Total Protein: 6.9 g/dL (ref 6.0–8.5)

## 2019-09-03 LAB — CBC WITH DIFFERENTIAL/PLATELET
Basophils Absolute: 0 10*3/uL (ref 0.0–0.2)
Basos: 1 %
EOS (ABSOLUTE): 0.2 10*3/uL (ref 0.0–0.4)
Eos: 3 %
Hematocrit: 42.9 % (ref 34.0–46.6)
Hemoglobin: 13.5 g/dL (ref 11.1–15.9)
Immature Grans (Abs): 0 10*3/uL (ref 0.0–0.1)
Immature Granulocytes: 0 %
Lymphocytes Absolute: 1.9 10*3/uL (ref 0.7–3.1)
Lymphs: 39 %
MCH: 29.3 pg (ref 26.6–33.0)
MCHC: 31.5 g/dL (ref 31.5–35.7)
MCV: 93 fL (ref 79–97)
Monocytes Absolute: 0.4 10*3/uL (ref 0.1–0.9)
Monocytes: 7 %
Neutrophils Absolute: 2.5 10*3/uL (ref 1.4–7.0)
Neutrophils: 50 %
Platelets: 240 10*3/uL (ref 150–450)
RBC: 4.61 x10E6/uL (ref 3.77–5.28)
RDW: 13.1 % (ref 11.7–15.4)
WBC: 5 10*3/uL (ref 3.4–10.8)

## 2019-09-03 LAB — LIPID PANEL
Chol/HDL Ratio: 3 ratio (ref 0.0–4.4)
Cholesterol, Total: 175 mg/dL (ref 100–199)
HDL: 58 mg/dL (ref >39)
LDL Chol Calc (NIH): 95 mg/dL (ref 0–99)
Triglycerides: 124 mg/dL (ref 0–149)
VLDL Cholesterol Cal: 22 mg/dL (ref 5–40)

## 2019-09-03 LAB — HEMOGLOBIN A1C
Est. average glucose Bld gHb Est-mCnc: 126 mg/dL
Hgb A1c MFr Bld: 6 % — ABNORMAL HIGH (ref 4.8–5.6)

## 2019-09-12 ENCOUNTER — Ambulatory Visit (INDEPENDENT_AMBULATORY_CARE_PROVIDER_SITE_OTHER): Payer: BC Managed Care – PPO | Admitting: Psychology

## 2019-09-12 DIAGNOSIS — F321 Major depressive disorder, single episode, moderate: Secondary | ICD-10-CM

## 2019-09-26 ENCOUNTER — Ambulatory Visit (INDEPENDENT_AMBULATORY_CARE_PROVIDER_SITE_OTHER): Payer: BC Managed Care – PPO | Admitting: Psychology

## 2019-09-26 DIAGNOSIS — F321 Major depressive disorder, single episode, moderate: Secondary | ICD-10-CM

## 2019-10-17 ENCOUNTER — Ambulatory Visit (INDEPENDENT_AMBULATORY_CARE_PROVIDER_SITE_OTHER): Payer: BC Managed Care – PPO | Admitting: Psychology

## 2019-10-17 DIAGNOSIS — F321 Major depressive disorder, single episode, moderate: Secondary | ICD-10-CM

## 2019-10-21 ENCOUNTER — Telehealth: Payer: Self-pay | Admitting: Physician Assistant

## 2019-10-21 DIAGNOSIS — E781 Pure hyperglyceridemia: Secondary | ICD-10-CM

## 2019-10-21 DIAGNOSIS — E785 Hyperlipidemia, unspecified: Secondary | ICD-10-CM

## 2019-10-21 MED ORDER — ROSUVASTATIN CALCIUM 5 MG PO TABS: 5.0000 mg | ORAL_TABLET | Freq: Every evening | ORAL | 0 refills | Status: DC

## 2019-10-21 NOTE — Telephone Encounter (Signed)
Refill sent to requested pharmacy. AS, CMA 

## 2019-10-21 NOTE — Addendum Note (Signed)
Addended by: Mickel Crow on: 10/21/2019 10:24 AM   Modules accepted: Orders

## 2019-10-21 NOTE — Telephone Encounter (Signed)
Patient needs a refill on crestor. Please send to [facility]. Thanks

## 2019-11-01 ENCOUNTER — Ambulatory Visit (INDEPENDENT_AMBULATORY_CARE_PROVIDER_SITE_OTHER): Payer: BC Managed Care – PPO | Admitting: Psychology

## 2019-11-01 DIAGNOSIS — F321 Major depressive disorder, single episode, moderate: Secondary | ICD-10-CM | POA: Diagnosis not present

## 2019-11-18 ENCOUNTER — Ambulatory Visit (INDEPENDENT_AMBULATORY_CARE_PROVIDER_SITE_OTHER): Payer: BC Managed Care – PPO | Admitting: Psychology

## 2019-11-18 DIAGNOSIS — F321 Major depressive disorder, single episode, moderate: Secondary | ICD-10-CM | POA: Diagnosis not present

## 2019-12-02 ENCOUNTER — Ambulatory Visit: Payer: BC Managed Care – PPO | Admitting: Psychology

## 2019-12-09 ENCOUNTER — Ambulatory Visit (INDEPENDENT_AMBULATORY_CARE_PROVIDER_SITE_OTHER): Payer: BC Managed Care – PPO | Admitting: Psychology

## 2019-12-09 DIAGNOSIS — F321 Major depressive disorder, single episode, moderate: Secondary | ICD-10-CM | POA: Diagnosis not present

## 2019-12-10 ENCOUNTER — Other Ambulatory Visit: Payer: Self-pay

## 2019-12-10 ENCOUNTER — Encounter: Payer: Self-pay | Admitting: Physician Assistant

## 2019-12-10 ENCOUNTER — Ambulatory Visit: Payer: BC Managed Care – PPO | Admitting: Physician Assistant

## 2019-12-10 VITALS — BP 123/68 | HR 60 | Temp 98.3°F | Ht 64.0 in | Wt 174.3 lb

## 2019-12-10 DIAGNOSIS — H8110 Benign paroxysmal vertigo, unspecified ear: Secondary | ICD-10-CM

## 2019-12-10 DIAGNOSIS — F4321 Adjustment disorder with depressed mood: Secondary | ICD-10-CM | POA: Diagnosis not present

## 2019-12-10 DIAGNOSIS — E785 Hyperlipidemia, unspecified: Secondary | ICD-10-CM | POA: Diagnosis not present

## 2019-12-10 DIAGNOSIS — M25561 Pain in right knee: Secondary | ICD-10-CM

## 2019-12-10 DIAGNOSIS — E781 Pure hyperglyceridemia: Secondary | ICD-10-CM | POA: Diagnosis not present

## 2019-12-10 DIAGNOSIS — R7303 Prediabetes: Secondary | ICD-10-CM | POA: Diagnosis not present

## 2019-12-10 DIAGNOSIS — F430 Acute stress reaction: Secondary | ICD-10-CM

## 2019-12-10 DIAGNOSIS — Z1231 Encounter for screening mammogram for malignant neoplasm of breast: Secondary | ICD-10-CM

## 2019-12-10 NOTE — Patient Instructions (Signed)

## 2019-12-10 NOTE — Assessment & Plan Note (Signed)
-  Last A1c 6.0, stable -Recommend to monitor carbohydrates and glucose. -Continue to stay active. -Will continue to monitor and repeat with CPE.

## 2019-12-10 NOTE — Assessment & Plan Note (Deleted)
-  Last A1c 6.0, stable -Recommend to monitor carbohydrates and glucose. -Continue to stay active. -Will continue to monitor and repeat with CPE.

## 2019-12-10 NOTE — Progress Notes (Signed)
Established Patient Office Visit  Subjective:  Patient ID: Paula Conway, female    DOB: 1955/11/08  Age: 64 y.o. MRN: [id number]  CC:  Chief Complaint  Patient presents with   Hyperlipidemia   Mood   Pre-DM    HPI Paula Conway presents for chronic follow up on hyperlipidemia, mood and prediabetes. Reports experienced a brief episode of vertigo while driving and had to pull over. Dr. Raliegh Scarlet gave her some exercises to do for vertigo which do help relief her symptoms.  HLD: Pt taking medication as directed without issues. Denies side effects. Reports good hydration and has started a new exercise program since the pool is closed for water aerobics.   Mood: Continues to see therapist. Is trying to do her best during this season. Reports medication compliance. States has noticed her meltdowns are shorter lasting and is able to cope a little better.   Prediabetes: Asymptomatic.   Right knee pain: States has been having right knee pain, which is unsure if it is related to her new exercise regimen or spinal stenosis. Has applied topical essential oils which provide some relief. States ran out of they Cymbalta and has not been taking it anymore.  Past Medical History:  Diagnosis Date   Allergy    Arthritis    Environmental and seasonal allergies 07/20/2015   Family history of breast cancer in Mother 07/20/2015   Mother was diagnosed at 17 years old and died age 36 of breast cancer    GERD (gastroesophageal reflux disease)    Hyperlipidemia    Obesity 07/20/2015   Spinal stenosis     Past Surgical History:  Procedure Laterality Date   APPENDECTOMY     BREAST BIOPSY Right    core biopsy      Family History  Problem Relation Age of Onset   Breast cancer Mother    Diabetes Father    Hypertension Father    Heart disease Father     Social History   Socioeconomic History   Marital status: Married    Spouse name: Not on file   Number of children:  Not on file   Years of education: Not on file   Highest education level: Not on file  Occupational History   Not on file  Tobacco Use   Smoking status: Former Smoker    Packs/day: 0.25    Years: 3.00    Pack years: 0.75    Quit date: 01/11/1975    Years since quitting: 44.9   Smokeless tobacco: Never Used  Vaping Use   Vaping Use: Never used  Substance and Sexual Activity   Alcohol use: Yes    Comment: socially   Drug use: No   Sexual activity: Yes  Other Topics Concern   Not on file  Social History Narrative   Not on file   Social Determinants of Health   Financial Resource Strain:    Difficulty of Paying Living Expenses: Not on file  Food Insecurity:    Worried About Charity fundraiser in the Last Year: Not on file   [employer] of Food in the Last Year: Not on file  Transportation Needs:    Lack of Transportation (Medical): Not on file   Lack of Transportation (Non-Medical): Not on file  Physical Activity:    Days of Exercise per Week: Not on file   Minutes of Exercise per Session: Not on file  Stress:    Feeling of Stress : Not on file  Social Connections:    Frequency of Communication with Friends and Family: Not on file   Frequency of Social Gatherings with Friends and Family: Not on file   Attends Religious Services: Not on Electrical engineer or Organizations: Not on file   Attends Archivist Meetings: Not on file   Marital Status: Not on file  Intimate Partner Violence:    Fear of Current or Ex-Partner: Not on file   Emotionally Abused: Not on file   Physically Abused: Not on file   Sexually Abused: Not on file    Outpatient Medications Prior to Visit  Medication Sig Dispense Refill   Ascorbic Acid (VITAMIN C) 1000 MG tablet Take 1,000 mg by mouth daily.     Cholecalciferol (VITAMIN D3) 5000 units CAPS Take 5,000 Units by mouth daily.     FLUoxetine (PROZAC) 10 MG tablet Use one half tablet daily for 6d;  then 1 tablet daily 90 tablet 3   loratadine (CLARITIN) 10 MG tablet Take 10 mg by mouth daily.     Multiple Vitamin (MULTIVITAMIN) tablet Take 1 tablet by mouth daily.     pregabalin (LYRICA) 75 MG capsule Take 1 capsule (75 mg total) by mouth 2 (two) times daily. 180 capsule 1   Probiotic Product (PROBIOTIC PO) Take 1 tablet by mouth daily.     rosuvastatin (CRESTOR) 5 MG tablet Take 1 tablet (5 mg total) by mouth at bedtime. TAKE 1 TABLET BY MOUTH EVERY NIGHT BEFORE BED 90 tablet 0   Turmeric (QC TUMERIC COMPLEX PO) Take 1 tablet by mouth once.     Vitamin D, Ergocalciferol, (DRISDOL) 1.25 MG (50000 UNIT) CAPS capsule TAKE 1 CAPSULE BY MOUTH EVERY 7 DAYS. 12 capsule 10   DULoxetine (CYMBALTA) 20 MG capsule Take 1 capsule (20 mg total) by mouth daily. 90 capsule 0   ondansetron (ZOFRAN ODT) 8 MG disintegrating tablet Take 1 tablet (8 mg total) by mouth every 8 (eight) hours as needed for nausea or vomiting. 12 tablet 0   No facility-administered medications prior to visit.    No Known Allergies  ROS Review of Systems A fourteen system review of systems was performed and found to be positive as per HPI.   Objective:    Physical Exam General:  Well Developed, well nourished, appropriate for stated age.  Neuro:  Alert and oriented,  extra-ocular muscles intact  HEENT:  Normocephalic, atraumatic, neck supple Skin:  no gross rash, warm, pink. Cardiac:  RRR, S1 S2 Respiratory:  ECTA B/L, Not using accessory muscles, speaking in full sentences- unlabored. MSK: Good ROM, mild swelling of right knee when compared to left knee, no bony abnormality, no joint line tenderness of b/l knees  Vascular:  Ext warm, no cyanosis apprec.; cap RF less 2 sec. Psych:  Judgement and insight good, Euthymic mood. Full Affect.  BP 123/68    Pulse 60    Temp 98.3 F (36.8 C) (Oral)    Ht 5\' 4"  (1.626 m)    Wt 174 lb 4.8 oz (79.1 kg)    SpO2 98% Comment: on RA   BMI 29.92 kg/m  Wt Readings from Last  3 Encounters:  12/10/19 174 lb 4.8 oz (79.1 kg)  09/02/19 177 lb (80.3 kg)  07/10/19 180 lb 12.4 oz (82 kg)     Health Maintenance Due  Topic Date Due   COVID-19 Vaccine (1) Never done   COLONOSCOPY  06/20/2018   INFLUENZA VACCINE  Never done  PAP SMEAR-Modifier  08/31/2019    There are no preventive care reminders to display for this patient.  Lab Results  Component Value Date   TSH 3.970 04/29/2019   Lab Results  Component Value Date   WBC 5.0 09/02/2019   HGB 13.5 09/02/2019   HCT 42.9 09/02/2019   MCV 93 09/02/2019   PLT 240 09/02/2019   Lab Results  Component Value Date   NA 143 09/02/2019   K 4.5 09/02/2019   CO2 24 09/02/2019   GLUCOSE 95 09/02/2019   BUN 14 09/02/2019   CREATININE 0.91 09/02/2019   BILITOT 0.3 09/02/2019   ALKPHOS 79 09/02/2019   AST 16 09/02/2019   ALT 15 09/02/2019   PROT 6.9 09/02/2019   ALBUMIN 4.5 09/02/2019   CALCIUM 9.6 09/02/2019   ANIONGAP 16 (H) 07/10/2019   Lab Results  Component Value Date   CHOL 175 09/02/2019   Lab Results  Component Value Date   HDL 58 09/02/2019   Lab Results  Component Value Date   LDLCALC 95 09/02/2019   Lab Results  Component Value Date   TRIG 124 09/02/2019   Lab Results  Component Value Date   CHOLHDL 3.0 09/02/2019   Lab Results  Component Value Date   HGBA1C 6.0 (H) 09/02/2019      Assessment & Plan:   Problem List Items Addressed This Visit      Nervous and Auditory   BPPV (benign paroxysmal positional vertigo), unspecified laterality    -Fairly controlled. -Continue with exercises as needed.        Other   Hyperlipidemia - Primary (Chronic)    -Last lipid panel: total cholesterol 175, triglycerides 124, HDL 58, LDL 95 -Continue Crestor. -Follow a heart healthy diet. -Continue with exercise regimen. -Plan to repeat lipid panel and hepatic function with CPE.      Prediabetes (Chronic)    -Last A1c 6.0, stable -Recommend to monitor carbohydrates and  glucose. -Continue to stay active. -Will continue to monitor and repeat with CPE.      Grief reaction- loss of husband [name] Aug 8th, 2020   Reaction, situational, acute, to stress   Hypertriglyceridemia    -Last triglycerides 124, wnl -Continue current medication regimen and monitor simple carbohydrates.       Other Visit Diagnoses    Encounter for screening mammogram for malignant neoplasm of breast       Relevant Orders   MM Digital Screening   Right knee pain, unspecified chronicity          Grief reaction, Acute situational reaction to stress: -PHQ-9 score of 1, denies SI/HI. -Recommend to continue with [facility] therapy. -Continue to use good support system. -Continue current medication regimen.  -Will continue to monitor.  Right knee pain: -Continue conservative therapy and recommend to alternate heat/ice or apply topical anti-inflammatory such as diclofenac gel (Voltaren gel). -Continue to wear knee brace for support. -Discussed restarting Cymbalta if knee pain/other joint pain worsens. Patient verbalized understanding.  No orders of the defined types were placed in this encounter.   Follow-up: Return in about 4 months (around 04/08/2020) for CPE and FBW.    Lorrene Reid, PA-C

## 2019-12-10 NOTE — Assessment & Plan Note (Signed)
-  Last triglycerides 124, wnl -Continue current medication regimen and monitor simple carbohydrates.

## 2019-12-10 NOTE — Assessment & Plan Note (Addendum)
-  Fairly controlled. -Continue with exercises as needed.

## 2019-12-10 NOTE — Assessment & Plan Note (Addendum)
>>  ASSESSMENT AND PLAN FOR HYPERLIPIDEMIA WRITTEN ON 12/10/2019 12:46 PM BY Janee Ureste, PA-C  -Last lipid panel: total cholesterol 175, triglycerides 124, HDL 58, LDL 95 -Continue Crestor. -Follow a heart healthy diet. -Continue with exercise regimen. -Plan to repeat lipid panel and hepatic function with CPE.  >>ASSESSMENT AND PLAN FOR HYPERTRIGLYCERIDEMIA WRITTEN ON 12/10/2019 12:47 PM BY Philopateer Strine, PA-C  -Last triglycerides 124, wnl -Continue current medication regimen and monitor simple carbohydrates.

## 2019-12-23 ENCOUNTER — Ambulatory Visit (INDEPENDENT_AMBULATORY_CARE_PROVIDER_SITE_OTHER): Payer: BC Managed Care – PPO | Admitting: Psychology

## 2019-12-23 DIAGNOSIS — F321 Major depressive disorder, single episode, moderate: Secondary | ICD-10-CM

## 2020-01-13 ENCOUNTER — Ambulatory Visit: Payer: BC Managed Care – PPO | Admitting: Psychology

## 2020-01-14 ENCOUNTER — Other Ambulatory Visit: Payer: Self-pay | Admitting: Physician Assistant

## 2020-01-14 DIAGNOSIS — E781 Pure hyperglyceridemia: Secondary | ICD-10-CM

## 2020-01-14 DIAGNOSIS — E785 Hyperlipidemia, unspecified: Secondary | ICD-10-CM

## 2020-01-29 ENCOUNTER — Ambulatory Visit (INDEPENDENT_AMBULATORY_CARE_PROVIDER_SITE_OTHER): Payer: Medicare PPO | Admitting: Psychologist

## 2020-01-29 DIAGNOSIS — F321 Major depressive disorder, single episode, moderate: Secondary | ICD-10-CM

## 2020-02-10 ENCOUNTER — Ambulatory Visit
Admission: RE | Admit: 2020-02-10 | Discharge: 2020-02-10 | Disposition: A | Discharge status: Home / Self Care | Payer: Medicare PPO | Source: Ambulatory Visit | Attending: Physician Assistant | Admitting: Physician Assistant

## 2020-02-10 ENCOUNTER — Other Ambulatory Visit: Payer: Self-pay

## 2020-02-10 DIAGNOSIS — Z1231 Encounter for screening mammogram for malignant neoplasm of breast: Secondary | ICD-10-CM

## 2020-02-13 ENCOUNTER — Ambulatory Visit (INDEPENDENT_AMBULATORY_CARE_PROVIDER_SITE_OTHER): Payer: Medicare PPO | Admitting: Psychologist

## 2020-02-13 DIAGNOSIS — F321 Major depressive disorder, single episode, moderate: Secondary | ICD-10-CM | POA: Diagnosis not present

## 2020-02-26 ENCOUNTER — Ambulatory Visit (INDEPENDENT_AMBULATORY_CARE_PROVIDER_SITE_OTHER): Payer: Medicare PPO | Admitting: Psychologist

## 2020-02-26 DIAGNOSIS — F321 Major depressive disorder, single episode, moderate: Secondary | ICD-10-CM

## 2020-03-12 ENCOUNTER — Ambulatory Visit: Payer: Medicare PPO | Admitting: Psychologist

## 2020-03-17 ENCOUNTER — Ambulatory Visit (INDEPENDENT_AMBULATORY_CARE_PROVIDER_SITE_OTHER): Payer: Medicare PPO | Admitting: Psychologist

## 2020-03-17 DIAGNOSIS — F321 Major depressive disorder, single episode, moderate: Secondary | ICD-10-CM | POA: Diagnosis not present

## 2020-04-03 ENCOUNTER — Other Ambulatory Visit: Payer: Self-pay | Admitting: Physician Assistant

## 2020-04-03 DIAGNOSIS — E785 Hyperlipidemia, unspecified: Secondary | ICD-10-CM

## 2020-04-03 DIAGNOSIS — Z79899 Other long term (current) drug therapy: Secondary | ICD-10-CM

## 2020-04-03 DIAGNOSIS — R7303 Prediabetes: Secondary | ICD-10-CM

## 2020-04-03 DIAGNOSIS — R7989 Other specified abnormal findings of blood chemistry: Secondary | ICD-10-CM

## 2020-04-03 DIAGNOSIS — Z0000 Encounter for general adult medical examination without abnormal findings: Secondary | ICD-10-CM

## 2020-04-03 DIAGNOSIS — E559 Vitamin D deficiency, unspecified: Secondary | ICD-10-CM

## 2020-04-06 ENCOUNTER — Other Ambulatory Visit: Payer: Self-pay

## 2020-04-06 ENCOUNTER — Other Ambulatory Visit: Payer: Medicare PPO

## 2020-04-06 DIAGNOSIS — E559 Vitamin D deficiency, unspecified: Secondary | ICD-10-CM

## 2020-04-06 DIAGNOSIS — Z79899 Other long term (current) drug therapy: Secondary | ICD-10-CM

## 2020-04-06 DIAGNOSIS — R7989 Other specified abnormal findings of blood chemistry: Secondary | ICD-10-CM

## 2020-04-06 DIAGNOSIS — E785 Hyperlipidemia, unspecified: Secondary | ICD-10-CM

## 2020-04-06 DIAGNOSIS — R7303 Prediabetes: Secondary | ICD-10-CM

## 2020-04-06 DIAGNOSIS — Z0000 Encounter for general adult medical examination without abnormal findings: Secondary | ICD-10-CM

## 2020-04-07 LAB — CBC
Hematocrit: 41.8 % (ref 34.0–46.6)
Hemoglobin: 13.8 g/dL (ref 11.1–15.9)
MCH: 30.1 pg (ref 26.6–33.0)
MCHC: 33 g/dL (ref 31.5–35.7)
MCV: 91 fL (ref 79–97)
Platelets: 210 10*3/uL (ref 150–450)
RBC: 4.59 x10E6/uL (ref 3.77–5.28)
RDW: 12.3 % (ref 11.7–15.4)
WBC: 5.8 10*3/uL (ref 3.4–10.8)

## 2020-04-07 LAB — TSH: TSH: 4.36 u[IU]/mL (ref 0.450–4.500)

## 2020-04-07 LAB — COMPREHENSIVE METABOLIC PANEL
ALT: 14 IU/L (ref 0–32)
AST: 18 IU/L (ref 0–40)
Albumin/Globulin Ratio: 2 (ref 1.2–2.2)
Albumin: 4.5 g/dL (ref 3.8–4.8)
Alkaline Phosphatase: 79 IU/L (ref 44–121)
BUN/Creatinine Ratio: 13 (ref 12–28)
BUN: 12 mg/dL (ref 8–27)
Bilirubin Total: 0.3 mg/dL (ref 0.0–1.2)
CO2: 22 mmol/L (ref 20–29)
Calcium: 9.7 mg/dL (ref 8.7–10.3)
Chloride: 101 mmol/L (ref 96–106)
Creatinine, Ser: 0.91 mg/dL (ref 0.57–1.00)
Globulin, Total: 2.3 g/dL (ref 1.5–4.5)
Glucose: 98 mg/dL (ref 65–99)
Potassium: 4.7 mmol/L (ref 3.5–5.2)
Sodium: 137 mmol/L (ref 134–144)
Total Protein: 6.8 g/dL (ref 6.0–8.5)
eGFR: 70 mL/min/{1.73_m2} (ref >59)

## 2020-04-07 LAB — LIPID PANEL
Chol/HDL Ratio: 3 ratio (ref 0.0–4.4)
Cholesterol, Total: 171 mg/dL (ref 100–199)
HDL: 57 mg/dL (ref >39)
LDL Chol Calc (NIH): 87 mg/dL (ref 0–99)
Triglycerides: 156 mg/dL — ABNORMAL HIGH (ref 0–149)
VLDL Cholesterol Cal: 27 mg/dL (ref 5–40)

## 2020-04-07 LAB — HEMOGLOBIN A1C
Est. average glucose Bld gHb Est-mCnc: 128 mg/dL
Hgb A1c MFr Bld: 6.1 % — ABNORMAL HIGH (ref 4.8–5.6)

## 2020-04-07 LAB — VITAMIN D 25 HYDROXY (VIT D DEFICIENCY, FRACTURES): Vit D, 25-Hydroxy: 76.9 ng/mL (ref 30.0–100.0)

## 2020-04-08 ENCOUNTER — Other Ambulatory Visit: Payer: Self-pay

## 2020-04-08 ENCOUNTER — Ambulatory Visit (INDEPENDENT_AMBULATORY_CARE_PROVIDER_SITE_OTHER): Payer: Medicare PPO | Admitting: Physician Assistant

## 2020-04-08 ENCOUNTER — Encounter: Payer: Self-pay | Admitting: Physician Assistant

## 2020-04-08 VITALS — BP 121/77 | HR 67 | Temp 98.9°F | Ht 64.0 in | Wt 184.0 lb

## 2020-04-08 DIAGNOSIS — E781 Pure hyperglyceridemia: Secondary | ICD-10-CM

## 2020-04-08 DIAGNOSIS — Z0000 Encounter for general adult medical examination without abnormal findings: Secondary | ICD-10-CM

## 2020-04-08 DIAGNOSIS — M5416 Radiculopathy, lumbar region: Secondary | ICD-10-CM | POA: Diagnosis not present

## 2020-04-08 DIAGNOSIS — E785 Hyperlipidemia, unspecified: Secondary | ICD-10-CM

## 2020-04-08 DIAGNOSIS — M5126 Other intervertebral disc displacement, lumbar region: Secondary | ICD-10-CM | POA: Diagnosis not present

## 2020-04-08 MED ORDER — ROSUVASTATIN CALCIUM 5 MG PO TABS: 5.0000 mg | ORAL_TABLET | Freq: Every day | ORAL | 1 refills | Status: DC

## 2020-04-08 MED ORDER — PREGABALIN 75 MG PO CAPS: 75.0000 mg | ORAL_CAPSULE | Freq: Two times a day (BID) | ORAL | 1 refills | Status: DC

## 2020-04-08 NOTE — Progress Notes (Signed)
Subjective:     Paula Conway is a 65 y.o. female and is here for a comprehensive physical exam. The patient reports no problems.  Social History   Socioeconomic History  . Marital status: Widowed    Spouse name: Not on file  . Number of children: Not on file  . Years of education: Not on file  . Highest education level: Not on file  Occupational History  . Not on file  Tobacco Use  . Smoking status: Former Smoker    Packs/day: 0.25    Years: 3.00    Pack years: 0.75    Quit date: 01/11/1975    Years since quitting: 45.2  . Smokeless tobacco: Never Used  Vaping Use  . Vaping Use: Never used  Substance and Sexual Activity  . Alcohol use: Yes    Comment: socially  . Drug use: No  . Sexual activity: Yes  Other Topics Concern  . Not on file  Social History Narrative  . Not on file   Social Determinants of Health   Financial Resource Strain: Not on file  Food Insecurity: Not on file  Transportation Needs: Not on file  Physical Activity: Not on file  Stress: Not on file  Social Connections: Not on file  Intimate Partner Violence: Not on file   Health Maintenance  Topic Date Due  . COVID-19 Vaccine (1) Never done  . COLONOSCOPY (Pts 45-81yrs Insurance coverage will need to be confirmed)  06/20/2018  . INFLUENZA VACCINE  Never done  . PAP SMEAR-Modifier  08/31/2019  . DEXA SCAN  Never done  . PNA vac Low Risk Adult (1 of 2 - PCV13) Never done  . TETANUS/TDAP  10/27/2021  . MAMMOGRAM  02/09/2022  . Hepatitis C Screening  Completed  . HIV Screening  Completed  . HPV VACCINES  Aged Out    The following portions of the patient's history were reviewed and updated as appropriate: allergies, current medications, past family history, past medical history, past social history, past surgical history and problem list.  Review of Systems Pertinent items noted in HPI and remainder of comprehensive ROS otherwise negative.   Objective:    BP 121/77   Pulse 67   Temp  98.9 F (37.2 C)   Ht 5\' 4"  (1.626 m)   Wt 184 lb (83.5 kg)   SpO2 100%   BMI 31.58 kg/m  General appearance: alert, cooperative and no distress Head: Normocephalic, without obvious abnormality, atraumatic Eyes: conjunctivae/corneas clear. PERRL, EOM's intact. Fundi benign. Ears: normal TM's and external ear canals both ears Nose: Nares normal. Septum midline. Mucosa normal. No drainage or sinus tenderness. Throat: lips, mucosa, and tongue normal; teeth and gums normal Neck: no adenopathy, no JVD, supple, symmetrical, trachea midline and thyroid not enlarged, symmetric, no tenderness/mass/nodules Back: symmetric, no curvature. ROM normal. No CVA tenderness. Lungs: clear to auscultation bilaterally Heart: regular rate and rhythm, S1, S2 normal, no murmur, click, rub or gallop Abdomen: soft, non-tender; bowel sounds normal; no masses,  no organomegaly Pelvic: postmenopausal  Extremities: edema left lower extremity (trace) and no ulcers, gangrene or trophic changes Pulses: 2+ and symmetric Skin: Skin color, texture, turgor normal. No rashes or lesions Lymph nodes: Cervical adenopathy: normal and Supraclavicular adenopathy: normal Neurologic: Grossly normal    Assessment:    Healthy female exam.    Plan:  -Discussed with patient most recent lab results which are essentially within normal limits or stable from prior with the exception of mildly elevated A1c. Recommend  to reduce carbohydrates and glucose and increase physical activity. -UTD on Tdap, Covid vaccine, and mammogram. Declined colonoscopy due to prior complications with anesthesia and will contact insurance about Cologuard. Deferred bone density. Advised vaccines can be obtained at pharmacy (now has [facility]). -Recommend to stay well hydrated. -Continue current medication regimen. Provided refills. D/c vitamin D 50,000 units, Vit D well within normal range.  -Follow up in 4 months for HLD, PreDM, mood  See After Visit  Summary for Counseling Recommendations

## 2020-04-08 NOTE — Patient Instructions (Signed)
Preventive Care 65 Years and Older, Female Preventive care refers to lifestyle choices and visits with your health care provider that can promote health and wellness. This includes:  A yearly physical exam. This is also called an annual wellness visit.  Regular dental and eye exams.  Immunizations.  Screening for certain conditions.  Healthy lifestyle choices, such as: ? Eating a healthy diet. ? Getting regular exercise. ? Not using drugs or products that contain nicotine and tobacco. ? Limiting alcohol use. What can I expect for my preventive care visit? Physical exam Your health care provider will check your:  Height and weight. These may be used to calculate your BMI (body mass index). BMI is a measurement that tells if you are at a healthy weight.  Heart rate and blood pressure.  Body temperature.  Skin for abnormal spots. Counseling Your health care provider may ask you questions about your:  Past medical problems.  Family's medical history.  Alcohol, tobacco, and drug use.  Emotional well-being.  Home life and relationship well-being.  Sexual activity.  Diet, exercise, and sleep habits.  History of falls.  Memory and ability to understand (cognition).  Work and work Statistician.  Pregnancy and menstrual history.  Access to firearms. What immunizations do I need? Vaccines are usually given at various ages, according to a schedule. Your health care provider will recommend vaccines for you based on your age, medical history, and lifestyle or other factors, such as travel or where you work.   What tests do I need? Blood tests  Lipid and cholesterol levels. These may be checked every 5 years, or more often depending on your overall health.  Hepatitis C test.  Hepatitis B test. Screening  Lung cancer screening. You may have this screening every year starting at age 65 if you have a 30-pack-year history of smoking and currently smoke or have quit within  the past 15 years.  Colorectal cancer screening. ? All adults should have this screening starting at age 65 and continuing until age 65. ? Your health care provider may recommend screening at age 65 if you are at increased risk. ? You will have tests every 1-10 years, depending on your results and the type of screening test.  Diabetes screening. ? This is done by checking your blood sugar (glucose) after you have not eaten for a while (fasting). ? You may have this done every 1-3 years.  Mammogram. ? This may be done every 1-2 years. ? Talk with your health care provider about how often you should have regular mammograms.  Abdominal aortic aneurysm (AAA) screening. You may need this if you are a current or former smoker.  BRCA-related cancer screening. This may be done if you have a family history of breast, ovarian, tubal, or peritoneal cancers. Other tests  STD (sexually transmitted disease) testing, if you are at risk.  Bone density scan. This is done to screen for osteoporosis. You may have this done starting at age 65. Talk with your health care provider about your test results, treatment options, and if necessary, the need for more tests. Follow these instructions at home: Eating and drinking  Eat a diet that includes fresh fruits and vegetables, whole grains, lean protein, and low-fat dairy products. Limit your intake of foods with high amounts of sugar, saturated fats, and salt.  Take vitamin and mineral supplements as recommended by your health care provider.  Do not drink alcohol if your health care provider tells you not to drink.  If you drink alcohol: ? Limit how much you have to 65-1 drink a day. ? Be aware of how much alcohol is in your drink. In the [place] one drink equals one 12 oz bottle of beer (355 mL), one 5 oz glass of wine (148 mL), or one 1 oz glass of hard liquor (44 mL).   Lifestyle  Take daily care of your teeth and gums. Brush your teeth every morning  and night with fluoride toothpaste. Floss one time each day.  Stay active. Exercise for at least 30 minutes 5 or more days each week.  Do not use any products that contain nicotine or tobacco, such as cigarettes, e-cigarettes, and chewing tobacco. If you need help quitting, ask your health care provider.  Do not use drugs.  If you are sexually active, practice safe sex. Use a condom or other form of protection in order to prevent STIs (sexually transmitted infections).  Talk with your health care provider about taking a low-dose aspirin or statin.  Find healthy ways to cope with stress, such as: ? Meditation, yoga, or listening to music. ? Journaling. ? Talking to a trusted person. ? Spending time with friends and family. Safety  Always wear your seat belt while driving or riding in a vehicle.  Do not drive: ? If you have been drinking alcohol. Do not ride with someone who has been drinking. ? When you are tired or distracted. ? While texting.  Wear a helmet and other protective equipment during sports activities.  If you have firearms in your house, make sure you follow all gun safety procedures. What's next?  Visit your health care provider once a year for an annual wellness visit.  Ask your health care provider how often you should have your eyes and teeth checked.  Stay up to date on all vaccines. This information is not intended to replace advice given to you by your health care provider. Make sure you discuss any questions you have with your health care provider. Document Revised: 12/18/2019 Document Reviewed: 12/21/2017 Elsevier Patient Education  2021 Elsevier Inc.  

## 2020-04-13 ENCOUNTER — Ambulatory Visit (INDEPENDENT_AMBULATORY_CARE_PROVIDER_SITE_OTHER): Payer: Medicare PPO | Admitting: Psychologist

## 2020-04-13 DIAGNOSIS — F321 Major depressive disorder, single episode, moderate: Secondary | ICD-10-CM

## 2020-05-25 ENCOUNTER — Ambulatory Visit: Payer: Medicare PPO | Admitting: Psychologist

## 2020-06-02 ENCOUNTER — Encounter: Payer: Self-pay | Admitting: Physician Assistant

## 2020-06-16 ENCOUNTER — Ambulatory Visit (INDEPENDENT_AMBULATORY_CARE_PROVIDER_SITE_OTHER): Payer: Medicare PPO | Admitting: Physician Assistant

## 2020-06-16 ENCOUNTER — Encounter: Payer: Self-pay | Admitting: Physician Assistant

## 2020-06-16 ENCOUNTER — Other Ambulatory Visit: Payer: Self-pay

## 2020-06-16 VITALS — BP 123/72 | HR 66 | Temp 98.7°F | Ht 64.0 in | Wt 185.0 lb

## 2020-06-16 DIAGNOSIS — M25562 Pain in left knee: Secondary | ICD-10-CM

## 2020-06-16 DIAGNOSIS — S8992XA Unspecified injury of left lower leg, initial encounter: Secondary | ICD-10-CM

## 2020-06-16 DIAGNOSIS — M25511 Pain in right shoulder: Secondary | ICD-10-CM | POA: Diagnosis not present

## 2020-06-16 NOTE — Progress Notes (Signed)
Established Patient Office Visit  Subjective:  Patient ID: Paula Conway, female    DOB: April 21, 1955  Age: 65 y.o. MRN: [id number]  CC:  Chief Complaint  Patient presents with  . Knee Injury  . Shoulder Pain    HPI EMUNAH TEXIDOR presents for c/o right shoulder pain and left knee pain. Patient reports in January 2022 fell on her right shoulder and since then has been experiencing intermittent pain. States has trouble with putting on clothes especially when reaching towards her back. Also reports twisting her left knee while mowing the lawn last month. Initially had significant swelling and some bruising which mildly improved with ice and elevation. States continues to have left knee pain. Has trouble with going upstairs and has to walk one step at a time. Has applied various topical cream including essential oils, diclofenac gel, arnicare, and magnesium with CBD oil. Reports pain is exacerbated with rotational movements. Has been using knee sleeve for support.  Past Medical History:  Diagnosis Date  . Allergy   . Arthritis   . Environmental and seasonal allergies 07/20/2015  . Family history of breast cancer in Mother 07/20/2015   Mother was diagnosed at 16 years old and died age 16 of breast cancer   . GERD (gastroesophageal reflux disease)   . Hyperlipidemia   . Obesity 07/20/2015  . Spinal stenosis     Past Surgical History:  Procedure Laterality Date  . APPENDECTOMY    . BREAST BIOPSY Right   . core biopsy      Family History  Problem Relation Age of Onset  . Breast cancer Mother   . Diabetes Father   . Hypertension Father   . Heart disease Father     Social History   Socioeconomic History  . Marital status: Widowed    Spouse name: Not on file  . Number of children: Not on file  . Years of education: Not on file  . Highest education level: Not on file  Occupational History  . Not on file  Tobacco Use  . Smoking status: Former Smoker    Packs/day: 0.25     Years: 3.00    Pack years: 0.75    Quit date: 01/11/1975    Years since quitting: 45.4  . Smokeless tobacco: Never Used  Vaping Use  . Vaping Use: Never used  Substance and Sexual Activity  . Alcohol use: Yes    Comment: socially  . Drug use: No  . Sexual activity: Yes  Other Topics Concern  . Not on file  Social History Narrative  . Not on file   Social Determinants of Health   Financial Resource Strain: Not on file  Food Insecurity: Not on file  Transportation Needs: Not on file  Physical Activity: Not on file  Stress: Not on file  Social Connections: Not on file  Intimate Partner Violence: Not on file    Outpatient Medications Prior to Visit  Medication Sig Dispense Refill  . Ascorbic Acid (VITAMIN C) 1000 MG tablet Take 1,000 mg by mouth daily.    . Cholecalciferol (VITAMIN D3) 5000 units CAPS Take 5,000 Units by mouth daily.    Marland Kitchen loratadine (CLARITIN) 10 MG tablet Take 10 mg by mouth daily.    . Multiple Vitamin (MULTIVITAMIN) tablet Take 1 tablet by mouth daily.    . pregabalin (LYRICA) 75 MG capsule Take 1 capsule (75 mg total) by mouth 2 (two) times daily. 180 capsule 1  . Probiotic Product (PROBIOTIC PO)  Take 1 tablet by mouth daily.    . rosuvastatin (CRESTOR) 5 MG tablet Take 1 tablet (5 mg total) by mouth at bedtime. 90 tablet 1  . Turmeric (QC TUMERIC COMPLEX PO) Take 1 tablet by mouth once.     No facility-administered medications prior to visit.    No Known Allergies  ROS Review of Systems Review of Systems:  A fourteen system review of systems was performed and found to be positive as per HPI.   Objective:    Physical Exam General:  Well Developed, well nourished, appropriate for stated age.  Neuro:  Alert and oriented,  extra-ocular muscles intact  HEENT:  Normocephalic, atraumatic, neck supple, no carotid bruits appreciated  Skin:  no gross rash, warm, pink. Cardiac:  RRR, S1 S2 Respiratory:  ECTA B/L and A/P, Not using accessory muscles,  speaking in full sentences- unlabored. MSK: Good ROM of right shoulder with some discomfort with adduction and internal rotation, no tenderness over bicipital groove or deltoid/biceps muscle, +empy can test, some discomfort with Hawkins test, normal left shoulder. Mild swelling of left knee over medial aspect when compared to right knee, joint line tenderness of left knee, negative anterior and posterior drawer test, discomfort with McMurray test and external rotation.    Vascular:  Ext warm, no cyanosis apprec.; cap RF less 2 sec. Psych:  No HI/SI, judgement and insight good, Euthymic mood. Full Affect.   BP 123/72   Pulse 66   Temp 98.7 F (37.1 C)   Ht 5' 4"  (1.626 m)   Wt 185 lb (83.9 kg)   SpO2 99%   BMI 31.76 kg/m  Wt Readings from Last 3 Encounters:  06/16/20 185 lb (83.9 kg)  04/08/20 184 lb (83.5 kg)  12/10/19 174 lb 4.8 oz (79.1 kg)     Health Maintenance Due  Topic Date Due  . Pneumococcal Vaccine 57-47 Years old (1 of 4 - PCV13) Never done  . Zoster Vaccines- Shingrix (1 of 2) Never done  . COLONOSCOPY (Pts 45-53yr Insurance coverage will need to be confirmed)  06/20/2018  . PAP SMEAR-Modifier  08/31/2019  . DEXA SCAN  Never done  . PNA vac Low Risk Adult (1 of 2 - PCV13) Never done    There are no preventive care reminders to display for this patient.  Lab Results  Component Value Date   TSH 4.360 04/06/2020   Lab Results  Component Value Date   WBC 5.8 04/06/2020   HGB 13.8 04/06/2020   HCT 41.8 04/06/2020   MCV 91 04/06/2020   PLT 210 04/06/2020   Lab Results  Component Value Date   NA 137 04/06/2020   K 4.7 04/06/2020   CO2 22 04/06/2020   GLUCOSE 98 04/06/2020   BUN 12 04/06/2020   CREATININE 0.91 04/06/2020   BILITOT 0.3 04/06/2020   ALKPHOS 79 04/06/2020   AST 18 04/06/2020   ALT 14 04/06/2020   PROT 6.8 04/06/2020   ALBUMIN 4.5 04/06/2020   CALCIUM 9.7 04/06/2020   ANIONGAP 16 (H) 07/10/2019   EGFR 70 04/06/2020   Lab Results   Component Value Date   CHOL 171 04/06/2020   Lab Results  Component Value Date   HDL 57 04/06/2020   Lab Results  Component Value Date   LDLCALC 87 04/06/2020   Lab Results  Component Value Date   TRIG 156 (H) 04/06/2020   Lab Results  Component Value Date   CHOLHDL 3.0 04/06/2020   Lab Results  Component Value  Date   HGBA1C 6.1 (H) 04/06/2020      Assessment & Plan:   Problem List Items Addressed This Visit   None   Visit Diagnoses    Acute pain of left knee    -  Primary   Relevant Orders   Ambulatory referral to Orthopedic Surgery   Left knee injury, initial encounter       Relevant Orders   Ambulatory referral to Orthopedic Surgery   Right shoulder pain, unspecified chronicity       Relevant Orders   Ambulatory referral to Orthopedic Surgery     Left knee injury, initial encounter and left knee pain: -Discussed with patient suspicious for potential mensicus/ligament injury given PE findings and mechanism of injury so will place referral to Orthopedics. -Recommend to continue conservative therapy. Take Tylenol as needed for pain relief.  Right shoulder pain: -Will place referral to Orthopedics given symptoms have mildly improved with conservative therapy for the past 5 months. Discussed with patient likely rotator cuff tendinopathy/impingement syndrome. Offered corticosteroid therapy, patient declined at this time.   No orders of the defined types were placed in this encounter.   Follow-up: Return if symptoms worsen or fail to improve.    Lorrene Reid, PA-C

## 2020-06-24 ENCOUNTER — Ambulatory Visit (INDEPENDENT_AMBULATORY_CARE_PROVIDER_SITE_OTHER): Payer: Medicare PPO

## 2020-06-24 ENCOUNTER — Ambulatory Visit (INDEPENDENT_AMBULATORY_CARE_PROVIDER_SITE_OTHER): Payer: Medicare PPO | Admitting: Orthopaedic Surgery

## 2020-06-24 ENCOUNTER — Encounter: Payer: Self-pay | Admitting: Orthopaedic Surgery

## 2020-06-24 ENCOUNTER — Other Ambulatory Visit: Payer: Self-pay

## 2020-06-24 DIAGNOSIS — G8929 Other chronic pain: Secondary | ICD-10-CM

## 2020-06-24 DIAGNOSIS — M25511 Pain in right shoulder: Secondary | ICD-10-CM | POA: Diagnosis not present

## 2020-06-24 DIAGNOSIS — M25562 Pain in left knee: Secondary | ICD-10-CM | POA: Diagnosis not present

## 2020-06-24 NOTE — Progress Notes (Signed)
Office Visit Note   Patient: Paula Conway           Date of Birth: 01/10/1956           MRN: [id number] Visit Date: 06/24/2020              Requested by: Paula Reid, PA-C [address],  [place] [address] PCP: Paula Reid, PA-C   Assessment & Plan: Visit Diagnoses:  1. Chronic right shoulder pain   2. Chronic pain of left knee     Plan: In regards to the left knee impression is OA exacerbation versus medial meniscal tear.  Based on our discussion of treatment options she elected to undergo aspiration and cortisone injection today.  We obtained about 20 cc of blood-tinged fluid which was sent to the lab.  She will take it easy over the next month or so.  She will also wear compression knee sleeve during activity.  We provided her with home exercises today.  In regards to the right shoulder my impression is rotator cuff tendinosis with some partial-thickness tearing.  Based on treatment options she elected to have a subacromial injection and a set of home exercises.  She will let us know how she responds to these treatments.  Follow-Up Instructions: Return if symptoms worsen or fail to improve.   Orders:  Orders Placed This Encounter  Procedures   XR Shoulder Right   XR KNEE 3 VIEW LEFT   Cell count + diff,  w/ cryst-synvl fld   No orders of the defined types were placed in this encounter.     Procedures: No procedures performed   Clinical Data: No additional findings.   Subjective: Chief Complaint  Patient presents with   Left Knee - Pain   Right Shoulder - Pain    Paula Conway is a 65 year old female who comes in for evaluation of left knee pain and right shoulder pain.  In regards to the left knee she has had several incidents in which she twisted her knee.  This happened once after Easter and once about a month ago.  She has had catching and clicking as a result.  She feels swelling in the knee as well as medial sided pain.  The pain  is worse with going up stairs.  She also has some pain in the back of the knee.  In regards to her right shoulder she has noticed limited range of motion especially with reaching behind her back for putting on her bra.  She slipped on ice in January and fell directly on her right shoulder.  She has been taking Aleve and ibuprofen for the symptoms.   Review of Systems  Constitutional: Negative.   HENT: Negative.    Eyes: Negative.   Respiratory: Negative.    Cardiovascular: Negative.   Endocrine: Negative.   Musculoskeletal: Negative.   Neurological: Negative.   Hematological: Negative.   Psychiatric/Behavioral: Negative.    All other systems reviewed and are negative.   Objective: Vital Signs: There were no vitals taken for this visit.  Physical Exam Vitals and nursing note reviewed.  Constitutional:      Appearance: She is well-developed.  HENT:     Head: Normocephalic and atraumatic.  Pulmonary:     Effort: Pulmonary effort is normal.  Abdominal:     Palpations: Abdomen is soft.  Musculoskeletal:     Cervical back: Neck supple.  Skin:    General: Skin is warm.  Capillary Refill: Capillary refill takes less than 2 seconds.  Neurological:     Mental Status: She is alert and oriented to person, place, and time.  Psychiatric:        Behavior: Behavior normal.        Thought Content: Thought content normal.        Judgment: Judgment normal.    Ortho Exam Left knee shows a medium joint effusion.  Medial joint line tenderness.  Collaterals and cruciates are stable.  Pain with McMurray testing at the medial joint line.  Range of motion is mildly restricted.  Right shoulder shows mild pain with full passive and active range of motion.  Manual muscle testing of the rotator cuff shows very slight weakness and mild to moderate pain.  Negative for Hawkins and Neer sign.  Negative Speed sign. Specialty Comments:  No specialty comments available.  Imaging: XR KNEE 3 VIEW  LEFT  Result Date: 06/24/2020 Moderate medial compartment joint space narrowing  XR Shoulder Right  Result Date: 06/24/2020 Os acromiale.  No acute abnormalities.    PMFS History: Patient Active Problem List   Diagnosis Date Noted   Statin declined 11/16/2018   Hypertriglyceridemia 11/16/2018   Grief reaction- loss of husband Paula Conway Aug 8th, 2020 10/02/2018   Reaction, situational, acute, to stress 10/02/2018   Dysfunction of both eustachian tubes 07/24/2017   BPPV (benign paroxysmal positional vertigo), unspecified laterality 07/24/2017   Vitamin D insufficiency 03/15/2017   Lumbar discogenic pain syndrome 02/15/2017   Radiculopathy, lumbar region 02/15/2017   Arthritis of facet joints at multiple vertebral levels 02/15/2017   Low back pain 02/15/2017   Glucose intolerance (impaired glucose tolerance) 09/14/2016   Obesity, Class I, BMI 30-34.9 08/30/2016   Health education/counseling 08/30/2016   Prediabetes 07/30/2015   Elevated LDL cholesterol level 07/30/2015   Elevated TSH 07/30/2015   Family history of breast cancer in Mother 07/20/2015   GERD (gastroesophageal reflux disease) 07/20/2015   Generalized OA 07/20/2015   Hyperlipidemia 07/20/2015   Environmental and seasonal allergies 07/20/2015   Spinal stenosis: Chronic back pain  07/20/2015   Encounter for screening mammogram for breast cancer 07/16/2015   Neoplasm of connective tissue 11/23/2011   Past Medical History:  Diagnosis Date   Allergy    Arthritis    Environmental and seasonal allergies 07/20/2015   Family history of breast cancer in Mother 07/20/2015   Mother was diagnosed at 80 years old and died age 73 of breast cancer    GERD (gastroesophageal reflux disease)    Hyperlipidemia    Obesity 07/20/2015   Spinal stenosis     Family History  Problem Relation Age of Onset   Breast cancer Mother    Diabetes Father    Hypertension Father    Heart disease Father     Past Surgical History:  Procedure  Laterality Date   APPENDECTOMY     BREAST BIOPSY Right    core biopsy     Social History   Occupational History   Not on file  Tobacco Use   Smoking status: Former    Packs/day: 0.25    Years: 3.00    Pack years: 0.75    Types: Cigarettes    Quit date: 01/11/1975    Years since quitting: 45.4   Smokeless tobacco: Never  Vaping Use   Vaping Use: Never used  Substance and Sexual Activity   Alcohol use: Yes    Comment: socially   Drug use: No   Sexual activity: Yes

## 2020-06-25 LAB — SYNOVIAL FLUID ANALYSIS, COMPLETE
Basophils, %: 0 %
Eosinophils-Synovial: 0 % (ref 0–2)
Lymphocytes-Synovial Fld: 100 % — ABNORMAL HIGH (ref 0–74)
Monocyte/Macrophage: 0 % (ref 0–69)
Neutrophil, Synovial: 0 % (ref 0–24)
Synoviocytes, %: 0 % (ref 0–15)
WBC, Synovial: 75 cells/uL (ref <150)

## 2020-06-25 LAB — TIQ-NTM

## 2020-06-25 NOTE — Progress Notes (Signed)
Fluid looks normal

## 2020-06-25 NOTE — Progress Notes (Signed)
Patient aware.

## 2020-08-05 ENCOUNTER — Encounter: Payer: Self-pay | Admitting: Orthopaedic Surgery

## 2020-08-05 ENCOUNTER — Ambulatory Visit: Payer: Medicare PPO | Admitting: Orthopaedic Surgery

## 2020-08-05 DIAGNOSIS — M25562 Pain in left knee: Secondary | ICD-10-CM

## 2020-08-05 DIAGNOSIS — G8929 Other chronic pain: Secondary | ICD-10-CM | POA: Insufficient documentation

## 2020-08-05 DIAGNOSIS — Z96651 Presence of right artificial knee joint: Secondary | ICD-10-CM

## 2020-08-05 NOTE — Progress Notes (Signed)
Post-Op Visit Note   Patient: Paula Conway           Date of Birth: 03-03-1955           MRN: [id number] Visit Date: 08/05/2020 PCP: Lorrene Reid, PA-C   Assessment & Plan:  Chief Complaint:  Chief Complaint  Patient presents with   Left Knee - Pain   Visit Diagnoses:  1. Chronic pain of left knee     Plan: Robin follows up today for continued left knee pain.  I saw her a month ago for suspected medial meniscal tear.  She only had slight improvement from the injection and the aspiration which we took 20 cc out of her knee.  She continues to have medial sided knee pain that wraps around to the posterior side.  Continues to have mechanical symptoms.  Left knee exam shows medial joint line tenderness.  There is no effusion.  Pain with flexion of the knee past 90 degrees.  At this point given incomplete relief cortisone injection and continued symptoms we will need to obtain MRI of the left knee to evaluate for structural abnormalities.  Follow-up after the MRI.  Follow-Up Instructions: No follow-ups on file.   Orders:  No orders of the defined types were placed in this encounter.  No orders of the defined types were placed in this encounter.   Imaging: No results found.  PMFS History: Patient Active Problem List   Diagnosis Date Noted   Chronic pain of left knee 08/05/2020   Statin declined 11/16/2018   Hypertriglyceridemia 11/16/2018   Grief reaction- loss of husband [name] Aug 8th, 2020 10/02/2018   Reaction, situational, acute, to stress 10/02/2018   Dysfunction of both eustachian tubes 07/24/2017   BPPV (benign paroxysmal positional vertigo), unspecified laterality 07/24/2017   Vitamin D insufficiency 03/15/2017   Lumbar discogenic pain syndrome 02/15/2017   Radiculopathy, lumbar region 02/15/2017   Arthritis of facet joints at multiple vertebral levels 02/15/2017   Low back pain 02/15/2017   Glucose intolerance (impaired glucose tolerance) 09/14/2016    Obesity, Class I, BMI 30-34.9 08/30/2016   Health education/counseling 08/30/2016   Prediabetes 07/30/2015   Elevated LDL cholesterol level 07/30/2015   Elevated TSH 07/30/2015   Family history of breast cancer in Mother 07/20/2015   GERD (gastroesophageal reflux disease) 07/20/2015   Generalized OA 07/20/2015   Hyperlipidemia 07/20/2015   Environmental and seasonal allergies 07/20/2015   Spinal stenosis: Chronic back pain  07/20/2015   Encounter for screening mammogram for breast cancer 07/16/2015   Neoplasm of connective tissue 11/23/2011   Past Medical History:  Diagnosis Date   Allergy    Arthritis    Environmental and seasonal allergies 07/20/2015   Family history of breast cancer in Mother 07/20/2015   Mother was diagnosed at 76 years old and died age 10 of breast cancer    GERD (gastroesophageal reflux disease)    Hyperlipidemia    Obesity 07/20/2015   Spinal stenosis     Family History  Problem Relation Age of Onset   Breast cancer Mother    Diabetes Father    Hypertension Father    Heart disease Father     Past Surgical History:  Procedure Laterality Date   APPENDECTOMY     BREAST BIOPSY Right    core biopsy     Social History   Occupational History   Not on file  Tobacco Use   Smoking status: Former    Packs/day: 0.25    Years:  3.00    Pack years: 0.75    Types: Cigarettes    Quit date: 01/11/1975    Years since quitting: 45.5   Smokeless tobacco: Never  Vaping Use   Vaping Use: Never used  Substance and Sexual Activity   Alcohol use: Yes    Comment: socially   Drug use: No   Sexual activity: Yes

## 2020-08-10 ENCOUNTER — Encounter: Payer: Self-pay | Admitting: Physician Assistant

## 2020-08-10 ENCOUNTER — Other Ambulatory Visit: Payer: Self-pay

## 2020-08-10 ENCOUNTER — Ambulatory Visit (INDEPENDENT_AMBULATORY_CARE_PROVIDER_SITE_OTHER): Payer: Medicare PPO | Admitting: Physician Assistant

## 2020-08-10 VITALS — BP 132/86 | HR 66 | Temp 97.4°F | Ht 64.5 in | Wt 185.9 lb

## 2020-08-10 DIAGNOSIS — R7303 Prediabetes: Secondary | ICD-10-CM | POA: Diagnosis not present

## 2020-08-10 DIAGNOSIS — M4800 Spinal stenosis, site unspecified: Secondary | ICD-10-CM

## 2020-08-10 DIAGNOSIS — E785 Hyperlipidemia, unspecified: Secondary | ICD-10-CM | POA: Diagnosis not present

## 2020-08-10 DIAGNOSIS — R300 Dysuria: Secondary | ICD-10-CM

## 2020-08-10 DIAGNOSIS — F4321 Adjustment disorder with depressed mood: Secondary | ICD-10-CM

## 2020-08-10 LAB — POCT URINALYSIS DIPSTICK
Bilirubin, UA: NEGATIVE
Blood, UA: NEGATIVE
Glucose, UA: NEGATIVE
Ketones, UA: NEGATIVE
Nitrite, UA: POSITIVE
Protein, UA: NEGATIVE
Spec Grav, UA: 1.02 (ref 1.010–1.025)
Urobilinogen, UA: 0.2 E.U./dL
pH, UA: 6.5 (ref 5.0–8.0)

## 2020-08-10 NOTE — Progress Notes (Signed)
Established Patient Office Visit  Subjective:  Patient ID: Paula Conway, female    DOB: 1955-09-01  Age: 65 y.o. MRN: [id number]  CC:  Chief Complaint  Patient presents with   Follow-up    PreDM   Hyperlipidemia    HPI Paula Conway presents for follow up on mood, hyperlipidemia and prediabetes.  Patient reports 2 weeks ago had urinary frequency, urgency, dysuria, pelvic pressure and cold chills.  States that she treated with over-the-counter Azo and cranberry.  Symptoms have resolved and wants to ensure no infection present.  Mood: Patient reports July, August and September are challenging months because they remember her of her husband. Does have a good support system at home. Continues to cope with the passing of her husband.  HLD: Pt taking medication as directed without issues. Reports continues to work on improving her diet. States has reducing frying foods and bakes instead.   Prediabetes: Denies increased thirst or urination.   Spinal stenosis: Reports symptoms are stable. Taking medication as directed without issues. States has learned how to manage the chronic pain. Does not want surgical intervention.  Past Medical History:  Diagnosis Date   Allergy    Arthritis    Environmental and seasonal allergies 07/20/2015   Family history of breast cancer in Mother 07/20/2015   Mother was diagnosed at 79 years old and died age 73 of breast cancer    GERD (gastroesophageal reflux disease)    Hyperlipidemia    Obesity 07/20/2015   Spinal stenosis     Past Surgical History:  Procedure Laterality Date   APPENDECTOMY     BREAST BIOPSY Right    core biopsy      Family History  Problem Relation Age of Onset   Breast cancer Mother    Diabetes Father    Hypertension Father    Heart disease Father     Social History   Socioeconomic History   Marital status: Widowed    Spouse name: Not on file   Number of children: Not on file   Years of education: Not on file    Highest education level: Not on file  Occupational History   Not on file  Tobacco Use   Smoking status: Former    Packs/day: 0.25    Years: 3.00    Pack years: 0.75    Types: Cigarettes    Quit date: 01/11/1975    Years since quitting: 45.6   Smokeless tobacco: Never  Vaping Use   Vaping Use: Never used  Substance and Sexual Activity   Alcohol use: Yes    Comment: socially   Drug use: No   Sexual activity: Yes  Other Topics Concern   Not on file  Social History Narrative   Not on file   Social Determinants of Health   Financial Resource Strain: Not on file  Food Insecurity: Not on file  Transportation Needs: Not on file  Physical Activity: Not on file  Stress: Not on file  Social Connections: Not on file  Intimate Partner Violence: Not on file    Outpatient Medications Prior to Visit  Medication Sig Dispense Refill   Ascorbic Acid (VITAMIN C) 1000 MG tablet Take 1,000 mg by mouth daily.     Cholecalciferol (VITAMIN D3) 5000 units CAPS Take 5,000 Units by mouth daily.     loratadine (CLARITIN) 10 MG tablet Take 10 mg by mouth daily.     Multiple Vitamin (MULTIVITAMIN) tablet Take 1 tablet by mouth daily.  pregabalin (LYRICA) 75 MG capsule Take 1 capsule (75 mg total) by mouth 2 (two) times daily. 180 capsule 1   Probiotic Product (PROBIOTIC PO) Take 1 tablet by mouth daily.     rosuvastatin (CRESTOR) 5 MG tablet Take 1 tablet (5 mg total) by mouth at bedtime. 90 tablet 1   Turmeric (QC TUMERIC COMPLEX PO) Take 1 tablet by mouth once.     No facility-administered medications prior to visit.    No Known Allergies  ROS Review of Systems A fourteen system review of systems was performed and found to be positive as per HPI.   Objective:    Physical Exam General:  Well Developed, well nourished, appropriate for stated age.  Neuro:  Alert and oriented,  extra-ocular muscles intact  HEENT:  Normocephalic, atraumatic, neck supple, no carotid bruits appreciated   Skin:  no gross rash, warm, pink. Abdomen: Non-tender, nondistended, NBS, negative CVA tenderness b/l Cardiac:  RRR, S1 S2 wnl's Respiratory:  CTA B/L, Not using accessory muscles, speaking in full sentences- unlabored. Vascular:  Ext warm, no cyanosis apprec.; cap RF less 2 sec. Psych:  No HI/SI, judgement and insight good, Euthymic mood. Full Affect.  BP 132/86   Pulse 66   Temp (!) 97.4 F (36.3 C)   Ht 5' 4.5" (1.638 m)   Wt 185 lb 14.4 oz (84.3 kg)   SpO2 99%   BMI 31.42 kg/m  Wt Readings from Last 3 Encounters:  08/10/20 185 lb 14.4 oz (84.3 kg)  06/16/20 185 lb (83.9 kg)  04/08/20 184 lb (83.5 kg)     Health Maintenance Due  Topic Date Due   Zoster Vaccines- Shingrix (1 of 2) Never done   COLONOSCOPY (Pts 45-18yr Insurance coverage will need to be confirmed)  06/20/2018   PAP SMEAR-Modifier  08/31/2019   DEXA SCAN  Never done   PNA vac Low Risk Adult (1 of 2 - PCV13) Never done   COVID-19 Vaccine (4 - Booster for Pfizer series) 03/09/2020   INFLUENZA VACCINE  08/10/2020    There are no preventive care reminders to display for this patient.  Lab Results  Component Value Date   TSH 4.360 04/06/2020   Lab Results  Component Value Date   WBC 5.8 04/06/2020   HGB 13.8 04/06/2020   HCT 41.8 04/06/2020   MCV 91 04/06/2020   PLT 210 04/06/2020   Lab Results  Component Value Date   NA 137 04/06/2020   K 4.7 04/06/2020   CO2 22 04/06/2020   GLUCOSE 98 04/06/2020   BUN 12 04/06/2020   CREATININE 0.91 04/06/2020   BILITOT 0.3 04/06/2020   ALKPHOS 79 04/06/2020   AST 18 04/06/2020   ALT 14 04/06/2020   PROT 6.8 04/06/2020   ALBUMIN 4.5 04/06/2020   CALCIUM 9.7 04/06/2020   ANIONGAP 16 (H) 07/10/2019   EGFR 70 04/06/2020   Lab Results  Component Value Date   CHOL 171 04/06/2020   Lab Results  Component Value Date   HDL 57 04/06/2020   Lab Results  Component Value Date   LDLCALC 87 04/06/2020   Lab Results  Component Value Date   TRIG 156 (H)  04/06/2020   Lab Results  Component Value Date   CHOLHDL 3.0 04/06/2020   Lab Results  Component Value Date   HGBA1C 6.1 (H) 04/06/2020      Assessment & Plan:   Problem List Items Addressed This Visit       Other   Hyperlipidemia -  Primary (Chronic)    - Last lipid panel: Total cholesterol 171, triglycerides 156, HDL 57, LDL 87 -The 10-year ASCVD risk score Mikey Bussing DC Jr., et al., 2013) is: 5.4% -Continue current medication regimen.  We will repeat lipid panel and hepatic function at follow-up visit. -Encouraged to continue with dietary changes and follow a low-fat diet. -We will continue to monitor.       Spinal stenosis: Chronic back pain  (Chronic)    -Stable. -Continue current medication regimen.       Prediabetes (Chronic)    - Last A1c 6.1, stable. -Recommend to continue with dietary changes and reduce carbohydrate/glucose intake. -Will continue to monitor.       Grief reaction- loss of husband [name] Aug 8th, 2020    - PHQ-9 score of 5, GAD-7 score of 6.  Stable -Recommend to continue to use good support system.       Other Visit Diagnoses     Dysuria       Relevant Orders   POCT urinalysis dipstick (Completed)   Urine Culture      Dysuria: -UA collected, SG 1.020, negative for glucose, bilirubin, ketones, blood, protein with moderate leukocytes and positive nitrite, will send for urine culture. Pending urine culture will start antibiotic therapy if indicated. Patient is currently asymptomatic advised if starts to experience urinary symptoms/lower abd pain to let me know and will start empiric antibiotic therapy until culture resulted. Patient verbalized understanding.      No orders of the defined types were placed in this encounter.   Follow-up: Return in about 4 months (around 12/10/2020) for HLD, prediabetes, Wt and FBW (lipid panel, cmp, a1c) few days prior .    Lorrene Reid, PA-C

## 2020-08-10 NOTE — Assessment & Plan Note (Signed)
-  Stable. Continue current medication regimen. 

## 2020-08-10 NOTE — Assessment & Plan Note (Signed)
-   Last lipid panel: Total cholesterol 171, triglycerides 156, HDL 57, LDL 87 -The 10-year ASCVD risk score Mikey Bussing DC Jr., et al., 2013) is: 5.4% -Continue current medication regimen.  We will repeat lipid panel and hepatic function at follow-up visit. -Encouraged to continue with dietary changes and follow a low-fat diet. -We will continue to monitor.

## 2020-08-10 NOTE — Assessment & Plan Note (Signed)
-   Last A1c 6.1, stable. -Recommend to continue with dietary changes and reduce carbohydrate/glucose intake. -Will continue to monitor.

## 2020-08-10 NOTE — Assessment & Plan Note (Signed)
-   PHQ-9 score of 5, GAD-7 score of 6.  Stable -Recommend to continue to use good support system.

## 2020-08-12 ENCOUNTER — Other Ambulatory Visit: Payer: Self-pay | Admitting: Physician Assistant

## 2020-08-12 DIAGNOSIS — N3090 Cystitis, unspecified without hematuria: Secondary | ICD-10-CM

## 2020-08-12 LAB — URINE CULTURE

## 2020-08-12 MED ORDER — NITROFURANTOIN MONOHYD MACRO 100 MG PO CAPS: 100.0000 mg | ORAL_CAPSULE | Freq: Two times a day (BID) | ORAL | 0 refills | Status: DC

## 2020-08-13 ENCOUNTER — Encounter: Payer: Self-pay | Admitting: Orthopaedic Surgery

## 2020-08-13 ENCOUNTER — Other Ambulatory Visit: Payer: Self-pay

## 2020-08-13 DIAGNOSIS — G8929 Other chronic pain: Secondary | ICD-10-CM

## 2020-08-29 ENCOUNTER — Ambulatory Visit
Admission: RE | Admit: 2020-08-29 | Discharge: 2020-08-29 | Disposition: A | Discharge status: Home / Self Care | Payer: Medicare PPO | Source: Ambulatory Visit | Attending: Orthopaedic Surgery | Admitting: Orthopaedic Surgery

## 2020-08-29 DIAGNOSIS — G8929 Other chronic pain: Secondary | ICD-10-CM

## 2020-08-29 DIAGNOSIS — M25562 Pain in left knee: Secondary | ICD-10-CM

## 2020-09-04 ENCOUNTER — Other Ambulatory Visit: Payer: Self-pay

## 2020-09-04 ENCOUNTER — Encounter: Payer: Self-pay | Admitting: Orthopaedic Surgery

## 2020-09-04 ENCOUNTER — Ambulatory Visit: Payer: Medicare PPO | Admitting: Orthopaedic Surgery

## 2020-09-04 DIAGNOSIS — S83242A Other tear of medial meniscus, current injury, left knee, initial encounter: Secondary | ICD-10-CM | POA: Diagnosis not present

## 2020-09-04 NOTE — Progress Notes (Signed)
Office Visit Note   Patient: Paula Conway           Date of Birth: Jun 04, 1955           MRN: [id number] Visit Date: 09/04/2020              Requested by: Lorrene Reid, PA-C [place],  [place] [address] PCP: Lorrene Reid, PA-C   Assessment & Plan: Visit Diagnoses:  1. Acute medial meniscus tear of left knee, initial encounter     Plan: MRI of the left knee shows a complex tear of the posterior horn and the root of the medial meniscus.  She does have fair amount of chondromalacia of the medial compartment and the patellofemoral compartment as well.  Her symptoms do correlate with the torn meniscus and less so with the underlying chondromalacia.  I have recommended arthroscopic partial medial meniscectomy based on her continued symptoms however she would like to think about her options for now and see if she can live with the symptoms for now.  She will give [name] a call back if she wants to move forward with the arthroscopic surgery.  Follow-Up Instructions: No follow-ups on file.   Orders:  No orders of the defined types were placed in this encounter.  No orders of the defined types were placed in this encounter.     Procedures: No procedures performed   Clinical Data: No additional findings.   Subjective: Chief Complaint  Patient presents with   Left Knee - Pain    [name] returns today for left knee MRI review.  She continues to have pain with certain twisting or turning movements.  She does not have really any symptoms with straight line walking.   Review of Systems   Objective: Vital Signs: There were no vitals taken for this visit.  Physical Exam  Ortho Exam Left knee exam is unchanged. Specialty Comments:  No specialty comments available.  Imaging: No results found.   PMFS History: Patient Active Problem List   Diagnosis Date Noted   Acute medial meniscus tear of left knee 09/04/2020   Chronic pain of left knee  08/05/2020   Statin declined 11/16/2018   Hypertriglyceridemia 11/16/2018   Grief reaction- loss of husband [name] Aug 8th, 2020 10/02/2018   Reaction, situational, acute, to stress 10/02/2018   Dysfunction of both eustachian tubes 07/24/2017   BPPV (benign paroxysmal positional vertigo), unspecified laterality 07/24/2017   Vitamin D insufficiency 03/15/2017   Lumbar discogenic pain syndrome 02/15/2017   Radiculopathy, lumbar region 02/15/2017   Arthritis of facet joints at multiple vertebral levels 02/15/2017   Low back pain 02/15/2017   Glucose intolerance (impaired glucose tolerance) 09/14/2016   Obesity, Class I, BMI 30-34.9 08/30/2016   Health education/counseling 08/30/2016   Prediabetes 07/30/2015   Elevated LDL cholesterol level 07/30/2015   Elevated TSH 07/30/2015   Family history of breast cancer in Mother 07/20/2015   GERD (gastroesophageal reflux disease) 07/20/2015   Generalized OA 07/20/2015   Hyperlipidemia 07/20/2015   Environmental and seasonal allergies 07/20/2015   Spinal stenosis: Chronic back pain  07/20/2015   Encounter for screening mammogram for breast cancer 07/16/2015   Neoplasm of connective tissue 11/23/2011   Past Medical History:  Diagnosis Date   Allergy    Arthritis    Environmental and seasonal allergies 07/20/2015   Family history of breast cancer in Mother 07/20/2015   Mother was diagnosed at 74 years old and died age 34 of breast  cancer    GERD (gastroesophageal reflux disease)    Hyperlipidemia    Obesity 07/20/2015   Spinal stenosis     Family History  Problem Relation Age of Onset   Breast cancer Mother    Diabetes Father    Hypertension Father    Heart disease Father     Past Surgical History:  Procedure Laterality Date   APPENDECTOMY     BREAST BIOPSY Right    core biopsy     Social History   Occupational History   Not on file  Tobacco Use   Smoking status: Former    Packs/day: 0.25    Years: 3.00    Pack years: 0.75     Types: Cigarettes    Quit date: 01/11/1975    Years since quitting: 45.6   Smokeless tobacco: Never  Vaping Use   Vaping Use: Never used  Substance and Sexual Activity   Alcohol use: Yes    Comment: socially   Drug use: No   Sexual activity: Yes

## 2020-10-14 ENCOUNTER — Other Ambulatory Visit: Payer: Self-pay | Admitting: Physician Assistant

## 2020-10-14 DIAGNOSIS — E785 Hyperlipidemia, unspecified: Secondary | ICD-10-CM

## 2020-10-14 DIAGNOSIS — E781 Pure hyperglyceridemia: Secondary | ICD-10-CM

## 2020-10-14 DIAGNOSIS — M5416 Radiculopathy, lumbar region: Secondary | ICD-10-CM

## 2020-10-14 DIAGNOSIS — M5126 Other intervertebral disc displacement, lumbar region: Secondary | ICD-10-CM

## 2020-10-17 DIAGNOSIS — Z6831 Body mass index (BMI) 31.0-31.9, adult: Secondary | ICD-10-CM | POA: Diagnosis not present

## 2020-10-17 DIAGNOSIS — M4800 Spinal stenosis, site unspecified: Secondary | ICD-10-CM | POA: Diagnosis not present

## 2020-10-17 DIAGNOSIS — Z803 Family history of malignant neoplasm of breast: Secondary | ICD-10-CM | POA: Diagnosis not present

## 2020-10-17 DIAGNOSIS — R030 Elevated blood-pressure reading, without diagnosis of hypertension: Secondary | ICD-10-CM | POA: Diagnosis not present

## 2020-10-17 DIAGNOSIS — Z604 Social exclusion and rejection: Secondary | ICD-10-CM | POA: Diagnosis not present

## 2020-10-17 DIAGNOSIS — M1990 Unspecified osteoarthritis, unspecified site: Secondary | ICD-10-CM | POA: Diagnosis not present

## 2020-10-17 DIAGNOSIS — E669 Obesity, unspecified: Secondary | ICD-10-CM | POA: Diagnosis not present

## 2020-10-17 DIAGNOSIS — Z833 Family history of diabetes mellitus: Secondary | ICD-10-CM | POA: Diagnosis not present

## 2020-10-17 DIAGNOSIS — E785 Hyperlipidemia, unspecified: Secondary | ICD-10-CM | POA: Diagnosis not present

## 2020-12-02 ENCOUNTER — Ambulatory Visit (INDEPENDENT_AMBULATORY_CARE_PROVIDER_SITE_OTHER): Payer: Medicare PPO

## 2020-12-02 ENCOUNTER — Ambulatory Visit: Payer: Medicare PPO | Admitting: Orthopaedic Surgery

## 2020-12-02 ENCOUNTER — Other Ambulatory Visit: Payer: Self-pay

## 2020-12-02 DIAGNOSIS — S83242A Other tear of medial meniscus, current injury, left knee, initial encounter: Secondary | ICD-10-CM

## 2020-12-02 DIAGNOSIS — G8929 Other chronic pain: Secondary | ICD-10-CM | POA: Diagnosis not present

## 2020-12-02 DIAGNOSIS — M25561 Pain in right knee: Secondary | ICD-10-CM

## 2020-12-02 MED ORDER — METHYLPREDNISOLONE ACETATE 40 MG/ML IJ SUSP
40.0000 mg | INTRAMUSCULAR | Status: AC | PRN
Start: 2020-12-02 — End: 2020-12-02
  Administered 2020-12-02: 40 mg via INTRA_ARTICULAR

## 2020-12-02 MED ORDER — BUPIVACAINE HCL 0.5 % IJ SOLN
2.0000 mL | INTRAMUSCULAR | Status: AC | PRN
  Administered 2020-12-02: 2 mL via INTRA_ARTICULAR

## 2020-12-02 MED ORDER — LIDOCAINE HCL 1 % IJ SOLN
2.0000 mL | INTRAMUSCULAR | Status: AC | PRN
Start: 2020-12-02 — End: 2020-12-02
  Administered 2020-12-02: 2 mL

## 2020-12-02 NOTE — Progress Notes (Signed)
Office Visit Note   Patient: Paula Conway           Date of Birth: 1955-12-12           MRN: [id number] Visit Date: 12/02/2020              Requested by: Lorrene Reid, PA-C [place],  [place] [address] PCP: Lorrene Reid, PA-C   Assessment & Plan: Visit Diagnoses:  1. Chronic pain of right knee   2. Acute medial meniscus tear of left knee, initial encounter     Plan: In regards to the left knee we will move forward with scheduling for a partial medial meniscectomy and chondroplasty as indicated.  Risk benefits rehab recovery reviewed with the patient detail.  In regards to the right knee we decide to move forward with a cortisone injection today for followed by 6 weeks of relative rest and gradual increase in activity after that.  If the relief is short-lived then we will need to obtain MRI of the right knee.  Follow-Up Instructions: No follow-ups on file.   Orders:  Orders Placed This Encounter  Procedures   XR KNEE 3 VIEW RIGHT   No orders of the defined types were placed in this encounter.     Procedures: Large Joint Inj: R knee on 12/02/2020 9:28 AM Indications: pain Details: 22 G needle  Arthrogram: No  Medications: 40 mg methylPREDNISolone acetate 40 MG/ML; 2 mL lidocaine 1 %; 2 mL bupivacaine 0.5 % Consent was given by the patient. Patient was prepped and draped in the usual sterile fashion.      Clinical Data: No additional findings.   Subjective: Chief Complaint  Patient presents with   Right Knee - Follow-up   Left Knee - Follow-up    HPI  Paula Conway comes in today for recheck of left knee pain and new problem of right knee pain.  She had a left knee MRI that showed complex tear of the posterior horn the medial meniscus along with chondromalacia.  I did recommend arthroscopic surgery at the time but she needed time to think about her options.  She has decided that she would like to move forward with the surgery at this  time.  In terms of the right knee this has been bothering her more since she has had left knee pain.  She has pain on the medial side.  Not describing any real mechanical symptoms.  The right knee feels tight.  Review of Systems  Constitutional: Negative.   HENT: Negative.    Eyes: Negative.   Respiratory: Negative.    Cardiovascular: Negative.   Endocrine: Negative.   Musculoskeletal: Negative.   Neurological: Negative.   Hematological: Negative.   Psychiatric/Behavioral: Negative.    All other systems reviewed and are negative.   Objective: Vital Signs: There were no vitals taken for this visit.  Physical Exam Vitals and nursing note reviewed.  Constitutional:      Appearance: She is well-developed.  Pulmonary:     Effort: Pulmonary effort is normal.  Skin:    General: Skin is warm.     Capillary Refill: Capillary refill takes less than 2 seconds.  Neurological:     Mental Status: She is alert and oriented to person, place, and time.  Psychiatric:        Behavior: Behavior normal.        Thought Content: Thought content normal.        Judgment: Judgment normal.  Ortho Exam  Left knee exam is unchanged. Right knee shows trace effusion.  No significant joint line tenderness.  Causing cruciates are stable.  Negative [place].  Specialty Comments:  No specialty comments available.  Imaging: No results found.   PMFS History: Patient Active Problem List   Diagnosis Date Noted   Acute medial meniscus tear of left knee 09/04/2020   Chronic pain of right knee 08/05/2020   Statin declined 11/16/2018   Hypertriglyceridemia 11/16/2018   Grief reaction- loss of husband [name] Aug 8th, 2020 10/02/2018   Reaction, situational, acute, to stress 10/02/2018   Dysfunction of both eustachian tubes 07/24/2017   BPPV (benign paroxysmal positional vertigo), unspecified laterality 07/24/2017   Vitamin D insufficiency 03/15/2017   Lumbar discogenic pain syndrome 02/15/2017    Radiculopathy, lumbar region 02/15/2017   Arthritis of facet joints at multiple vertebral levels 02/15/2017   Low back pain 02/15/2017   Glucose intolerance (impaired glucose tolerance) 09/14/2016   Obesity, Class I, BMI 30-34.9 08/30/2016   Health education/counseling 08/30/2016   Prediabetes 07/30/2015   Elevated LDL cholesterol level 07/30/2015   Elevated TSH 07/30/2015   Family history of breast cancer in Mother 07/20/2015   GERD (gastroesophageal reflux disease) 07/20/2015   Generalized OA 07/20/2015   Hyperlipidemia 07/20/2015   Environmental and seasonal allergies 07/20/2015   Spinal stenosis: Chronic back pain  07/20/2015   Encounter for screening mammogram for breast cancer 07/16/2015   Neoplasm of connective tissue 11/23/2011   Past Medical History:  Diagnosis Date   Allergy    Arthritis    Environmental and seasonal allergies 07/20/2015   Family history of breast cancer in Mother 07/20/2015   Mother was diagnosed at 65 years old and died age 65 of breast cancer    GERD (gastroesophageal reflux disease)    Hyperlipidemia    Obesity 07/20/2015   Spinal stenosis     Family History  Problem Relation Age of Onset   Breast cancer Mother    Diabetes Father    Hypertension Father    Heart disease Father     Past Surgical History:  Procedure Laterality Date   APPENDECTOMY     BREAST BIOPSY Right    core biopsy     Social History   Occupational History   Not on file  Tobacco Use   Smoking status: Former    Packs/day: 0.25    Years: 3.00    Pack years: 0.75    Types: Cigarettes    Quit date: 01/11/1975    Years since quitting: 45.9   Smokeless tobacco: Never  Vaping Use   Vaping Use: Never used  Substance and Sexual Activity   Alcohol use: Yes    Comment: socially   Drug use: No   Sexual activity: Yes

## 2020-12-08 ENCOUNTER — Other Ambulatory Visit: Payer: Self-pay

## 2020-12-08 DIAGNOSIS — Z0000 Encounter for general adult medical examination without abnormal findings: Secondary | ICD-10-CM

## 2020-12-08 DIAGNOSIS — R7989 Other specified abnormal findings of blood chemistry: Secondary | ICD-10-CM

## 2020-12-08 DIAGNOSIS — E785 Hyperlipidemia, unspecified: Secondary | ICD-10-CM

## 2020-12-08 DIAGNOSIS — Z79899 Other long term (current) drug therapy: Secondary | ICD-10-CM

## 2020-12-08 DIAGNOSIS — R7303 Prediabetes: Secondary | ICD-10-CM

## 2020-12-09 ENCOUNTER — Other Ambulatory Visit: Payer: Self-pay

## 2020-12-09 ENCOUNTER — Other Ambulatory Visit: Payer: Medicare PPO

## 2020-12-09 DIAGNOSIS — E785 Hyperlipidemia, unspecified: Secondary | ICD-10-CM

## 2020-12-09 DIAGNOSIS — Z79899 Other long term (current) drug therapy: Secondary | ICD-10-CM

## 2020-12-09 DIAGNOSIS — R7303 Prediabetes: Secondary | ICD-10-CM | POA: Diagnosis not present

## 2020-12-09 DIAGNOSIS — R7989 Other specified abnormal findings of blood chemistry: Secondary | ICD-10-CM | POA: Diagnosis not present

## 2020-12-09 DIAGNOSIS — Z0000 Encounter for general adult medical examination without abnormal findings: Secondary | ICD-10-CM | POA: Diagnosis not present

## 2020-12-10 LAB — COMPREHENSIVE METABOLIC PANEL
ALT: 16 IU/L (ref 0–32)
AST: 15 IU/L (ref 0–40)
Albumin/Globulin Ratio: 2.3 — ABNORMAL HIGH (ref 1.2–2.2)
Albumin: 4.6 g/dL (ref 3.8–4.8)
Alkaline Phosphatase: 78 IU/L (ref 44–121)
BUN/Creatinine Ratio: 21 (ref 12–28)
BUN: 17 mg/dL (ref 8–27)
Bilirubin Total: 0.4 mg/dL (ref 0.0–1.2)
CO2: 20 mmol/L (ref 20–29)
Calcium: 9.4 mg/dL (ref 8.7–10.3)
Chloride: 101 mmol/L (ref 96–106)
Creatinine, Ser: 0.81 mg/dL (ref 0.57–1.00)
Globulin, Total: 2 g/dL (ref 1.5–4.5)
Glucose: 92 mg/dL (ref 70–99)
Potassium: 4.2 mmol/L (ref 3.5–5.2)
Sodium: 140 mmol/L (ref 134–144)
Total Protein: 6.6 g/dL (ref 6.0–8.5)
eGFR: 81 mL/min/{1.73_m2} (ref >59)

## 2020-12-10 LAB — LIPID PANEL
Chol/HDL Ratio: 3 ratio (ref 0.0–4.4)
Cholesterol, Total: 175 mg/dL (ref 100–199)
HDL: 59 mg/dL (ref >39)
LDL Chol Calc (NIH): 89 mg/dL (ref 0–99)
Triglycerides: 156 mg/dL — ABNORMAL HIGH (ref 0–149)
VLDL Cholesterol Cal: 27 mg/dL (ref 5–40)

## 2020-12-10 LAB — HEMOGLOBIN A1C
Est. average glucose Bld gHb Est-mCnc: 131 mg/dL
Hgb A1c MFr Bld: 6.2 % — ABNORMAL HIGH (ref 4.8–5.6)

## 2020-12-14 ENCOUNTER — Other Ambulatory Visit: Payer: Self-pay | Admitting: Physician Assistant

## 2020-12-14 MED ORDER — ONDANSETRON HCL 4 MG PO TABS: 4.0000 mg | ORAL_TABLET | Freq: Three times a day (TID) | ORAL | 0 refills | Status: DC | PRN

## 2020-12-14 MED ORDER — HYDROCODONE-ACETAMINOPHEN 5-325 MG PO TABS: 1.0000 | ORAL_TABLET | Freq: Four times a day (QID) | ORAL | 0 refills | Status: DC | PRN

## 2020-12-16 ENCOUNTER — Other Ambulatory Visit: Payer: Self-pay

## 2020-12-16 ENCOUNTER — Encounter: Payer: Self-pay | Admitting: Physician Assistant

## 2020-12-16 ENCOUNTER — Ambulatory Visit (INDEPENDENT_AMBULATORY_CARE_PROVIDER_SITE_OTHER): Payer: Medicare PPO | Admitting: Physician Assistant

## 2020-12-16 VITALS — BP 117/66 | HR 62 | Temp 97.4°F | Ht 64.0 in | Wt 187.1 lb

## 2020-12-16 DIAGNOSIS — R7303 Prediabetes: Secondary | ICD-10-CM

## 2020-12-16 DIAGNOSIS — E785 Hyperlipidemia, unspecified: Secondary | ICD-10-CM | POA: Diagnosis not present

## 2020-12-16 NOTE — Assessment & Plan Note (Addendum)
-  Discussed recent lipid panel results, triglycerides mildly elevated at 156, LDL 89, HDL 59. -Continue current medication regimen. Recent CMP, liver enzymes normal. -The 10-year ASCVD risk score (Arnett DK, et al., 2019) is: 4.2% -Recommend to follow a low fat diet. -Will continue to monitor.

## 2020-12-16 NOTE — Assessment & Plan Note (Signed)
-  Discussed recent A1c which mildly increased from 6.1 to 6.2 most likely secondary to recent glucose intake. Discussed low carbohydrate and glucose diet. Asymptomatic. -Will continue to monitor.

## 2020-12-16 NOTE — Patient Instructions (Signed)

## 2020-12-16 NOTE — Progress Notes (Signed)
Established Patient Office Visit  Subjective:  Patient ID: Paula Conway, female    DOB: 1955-10-12  Age: 65 y.o. MRN: [id number]  CC:  Chief Complaint  Patient presents with   Hyperlipidemia   Prediabetes    HPI Paula Conway presents for follow up on hyperlipidemia and prediabetes.  HLD: Pt taking medication as directed without issues. Tries to follow a balanced diet but likes sweets.   Prediabetes: Denies increased thirst or urination. Reports has not been as diligent with low carbohydrate and sugar diet but plans to start reducing sweets. Also is having left meniscus tear repair tomorrow which will hopefully help with resuming her physical activity level.  Past Medical History:  Diagnosis Date   Allergy    Arthritis    Environmental and seasonal allergies 07/20/2015   Family history of breast cancer in Mother 07/20/2015   Mother was diagnosed at 50 years old and died age 37 of breast cancer    GERD (gastroesophageal reflux disease)    Hyperlipidemia    Obesity 07/20/2015   Spinal stenosis     Past Surgical History:  Procedure Laterality Date   APPENDECTOMY     BREAST BIOPSY Right    core biopsy      Family History  Problem Relation Age of Onset   Breast cancer Mother    Diabetes Father    Hypertension Father    Heart disease Father     Social History   Socioeconomic History   Marital status: Widowed    Spouse name: Not on file   Number of children: Not on file   Years of education: Not on file   Highest education level: Not on file  Occupational History   Not on file  Tobacco Use   Smoking status: Former    Packs/day: 0.25    Years: 3.00    Pack years: 0.75    Types: Cigarettes    Quit date: 01/11/1975    Years since quitting: 45.9   Smokeless tobacco: Never  Vaping Use   Vaping Use: Never used  Substance and Sexual Activity   Alcohol use: Yes    Comment: socially   Drug use: No   Sexual activity: Yes  Other Topics Concern   Not on  file  Social History Narrative   Not on file   Social Determinants of Health   Financial Resource Strain: Not on file  Food Insecurity: Not on file  Transportation Needs: Not on file  Physical Activity: Not on file  Stress: Not on file  Social Connections: Not on file  Intimate Partner Violence: Not on file    Outpatient Medications Prior to Visit  Medication Sig Dispense Refill   Ascorbic Acid (VITAMIN C) 1000 MG tablet Take 1,000 mg by mouth daily.     Cholecalciferol (VITAMIN D3) 5000 units CAPS Take 5,000 Units by mouth daily.     HYDROcodone-acetaminophen (NORCO) 5-325 MG tablet Take 1 tablet by mouth every 6 (six) hours as needed. To be taken after surgery (Patient not taking: Reported on 12/16/2020) 30 tablet 0   loratadine (CLARITIN) 10 MG tablet Take 10 mg by mouth daily.     Multiple Vitamin (MULTIVITAMIN) tablet Take 1 tablet by mouth daily.     nitrofurantoin, macrocrystal-monohydrate, (MACROBID) 100 MG capsule Take 1 capsule (100 mg total) by mouth 2 (two) times daily. 10 capsule 0   ondansetron (ZOFRAN) 4 MG tablet Take 1 tablet (4 mg total) by mouth every 8 (eight) hours as  needed for nausea or vomiting. (Patient not taking: Reported on 12/16/2020) 40 tablet 0   pregabalin (LYRICA) 75 MG capsule TAKE 1 CAPSULE BY MOUTH 2 TIMES DAILY. 180 capsule 1   Probiotic Product (PROBIOTIC PO) Take 1 tablet by mouth daily.     rosuvastatin (CRESTOR) 5 MG tablet TAKE 1 TABLET (5 MG TOTAL) BY MOUTH AT BEDTIME. 90 tablet 1   Turmeric (QC TUMERIC COMPLEX PO) Take 1 tablet by mouth once.     No facility-administered medications prior to visit.    No Known Allergies  ROS Review of Systems A fourteen system review of systems was performed and found to be positive as per HPI.   Objective:    Physical Exam General:  Well Developed, well nourished, in no acute distress  Neuro:  Alert and oriented,  extra-ocular muscles intact  HEENT:  Normocephalic, atraumatic, neck supple Skin:   no gross rash, warm, pink. Cardiac:  RRR, S1 S2 Respiratory: CTA B/L, Not using accessory muscles, speaking in full sentences- unlabored. Vascular:  Ext warm, no cyanosis apprec.; cap RF less 2 sec. Psych:  No HI/SI, judgement and insight good, Euthymic mood. Full Affect.  BP 117/66   Pulse 62   Temp (!) 97.4 F (36.3 C)   Ht 5' 4"  (1.626 m)   Wt 187 lb 1.6 oz (84.9 kg)   SpO2 99%   BMI 32.12 kg/m  Wt Readings from Last 3 Encounters:  12/16/20 187 lb 1.6 oz (84.9 kg)  08/10/20 185 lb 14.4 oz (84.3 kg)  06/16/20 185 lb (83.9 kg)     Health Maintenance Due  Topic Date Due   Pneumonia Vaccine 63+ Years old (1 - PCV) Never done   Zoster Vaccines- Shingrix (1 of 2) Never done   COLONOSCOPY (Pts 45-54yr Insurance coverage will need to be confirmed)  06/20/2018   PAP SMEAR-Modifier  08/31/2019   DEXA SCAN  Never done   COVID-19 Vaccine (4 - Booster for Pfizer series) 02/04/2020   INFLUENZA VACCINE  Never done    There are no preventive care reminders to display for this patient.  Lab Results  Component Value Date   TSH 4.360 04/06/2020   Lab Results  Component Value Date   WBC 5.8 04/06/2020   HGB 13.8 04/06/2020   HCT 41.8 04/06/2020   MCV 91 04/06/2020   PLT 210 04/06/2020   Lab Results  Component Value Date   NA 140 12/09/2020   K 4.2 12/09/2020   CO2 20 12/09/2020   GLUCOSE 92 12/09/2020   BUN 17 12/09/2020   CREATININE 0.81 12/09/2020   BILITOT 0.4 12/09/2020   ALKPHOS 78 12/09/2020   AST 15 12/09/2020   ALT 16 12/09/2020   PROT 6.6 12/09/2020   ALBUMIN 4.6 12/09/2020   CALCIUM 9.4 12/09/2020   ANIONGAP 16 (H) 07/10/2019   EGFR 81 12/09/2020   Lab Results  Component Value Date   CHOL 175 12/09/2020   Lab Results  Component Value Date   HDL 59 12/09/2020   Lab Results  Component Value Date   LDLCALC 89 12/09/2020   Lab Results  Component Value Date   TRIG 156 (H) 12/09/2020   Lab Results  Component Value Date   CHOLHDL 3.0 12/09/2020    Lab Results  Component Value Date   HGBA1C 6.2 (H) 12/09/2020      Assessment & Plan:   Problem List Items Addressed This Visit       Other   Hyperlipidemia (Chronic)    -  Discussed recent lipid panel results, triglycerides mildly elevated at 156, LDL 89, HDL 59. -Continue current medication regimen. Recent CMP, liver enzymes normal. -The 10-year ASCVD risk score (Arnett DK, et al., 2019) is: 4.2% -Recommend to follow a low fat diet. -Will continue to monitor.      Prediabetes - Primary (Chronic)    -Discussed recent A1c which mildly increased from 6.1 to 6.2 most likely secondary to recent glucose intake. Discussed low carbohydrate and glucose diet. Asymptomatic. -Will continue to monitor.       No orders of the defined types were placed in this encounter.   Follow-up: Return for [facility] and [facility] few days prior in 4-6 months.    Lorrene Reid, PA-C

## 2020-12-17 ENCOUNTER — Ambulatory Visit: Payer: Medicare PPO | Admitting: Physician Assistant

## 2020-12-17 ENCOUNTER — Encounter: Payer: Self-pay | Admitting: Orthopaedic Surgery

## 2020-12-17 DIAGNOSIS — M94262 Chondromalacia, left knee: Secondary | ICD-10-CM | POA: Diagnosis not present

## 2020-12-17 DIAGNOSIS — G8918 Other acute postprocedural pain: Secondary | ICD-10-CM | POA: Diagnosis not present

## 2020-12-17 DIAGNOSIS — S83232A Complex tear of medial meniscus, current injury, left knee, initial encounter: Secondary | ICD-10-CM | POA: Diagnosis not present

## 2020-12-17 DIAGNOSIS — Y999 Unspecified external cause status: Secondary | ICD-10-CM | POA: Diagnosis not present

## 2020-12-17 DIAGNOSIS — M659 Synovitis and tenosynovitis, unspecified: Secondary | ICD-10-CM | POA: Diagnosis not present

## 2020-12-17 DIAGNOSIS — S83242A Other tear of medial meniscus, current injury, left knee, initial encounter: Secondary | ICD-10-CM | POA: Diagnosis not present

## 2020-12-17 DIAGNOSIS — X58XXXA Exposure to other specified factors, initial encounter: Secondary | ICD-10-CM | POA: Diagnosis not present

## 2020-12-24 ENCOUNTER — Ambulatory Visit (INDEPENDENT_AMBULATORY_CARE_PROVIDER_SITE_OTHER): Payer: Medicare PPO | Admitting: Orthopaedic Surgery

## 2020-12-24 ENCOUNTER — Other Ambulatory Visit: Payer: Self-pay

## 2020-12-24 ENCOUNTER — Encounter: Payer: Self-pay | Admitting: Orthopaedic Surgery

## 2020-12-24 DIAGNOSIS — Z9889 Other specified postprocedural states: Secondary | ICD-10-CM

## 2020-12-24 NOTE — Progress Notes (Signed)
Post-Op Visit Note   Patient: Paula Conway           Date of Birth: 04/20/1955           MRN: [id number] Visit Date: 12/24/2020 PCP: Lorrene Reid, PA-C   Assessment & Plan:  Chief Complaint:  Chief Complaint  Patient presents with   Left Knee - Pain   Visit Diagnoses:  1. S/P arthroscopy of left knee     Plan: Patient is a very pleasant 65 year old female who comes in today 1 week out left knee arthroscopic debridement medial meniscus and chondroplasty, date of surgery 12/17/2020.  Of note, she had grade 4 changes medial and patellofemoral compartments with grade 2-3 changes to the lateral compartment.  She has been doing well.  She has some discomfort with which is alleviated with Tylenol and Aleve.  She denies any calf pain, fevers or chills.  Examination of the left knee reveals well-healed surgical portals without complication.  Calf soft nontender.  She is neurovascular tact distally.  Today, sutures were removed and Steri-Strips applied.  Intraoperative pictures reviewed.  Home exercise provided.  She will follow-up with [name] in 5 weeks time for repeat evaluation.  Call with concerns or questions in the meantime.  Follow-Up Instructions: Return in about 5 weeks (around 01/28/2021).   Orders:  No orders of the defined types were placed in this encounter.  No orders of the defined types were placed in this encounter.   Imaging: No new imaging  PMFS History: Patient Active Problem List   Diagnosis Date Noted   Acute medial meniscus tear of left knee 09/04/2020   Chronic pain of right knee 08/05/2020   Statin declined 11/16/2018   Hypertriglyceridemia 11/16/2018   Grief reaction- loss of husband [name] Aug 8th, 2020 10/02/2018   Reaction, situational, acute, to stress 10/02/2018   Dysfunction of both eustachian tubes 07/24/2017   BPPV (benign paroxysmal positional vertigo), unspecified laterality 07/24/2017   Vitamin D insufficiency 03/15/2017   Lumbar discogenic  pain syndrome 02/15/2017   Radiculopathy, lumbar region 02/15/2017   Arthritis of facet joints at multiple vertebral levels 02/15/2017   Low back pain 02/15/2017   Glucose intolerance (impaired glucose tolerance) 09/14/2016   Obesity, Class I, BMI 30-34.9 08/30/2016   Health education/counseling 08/30/2016   Prediabetes 07/30/2015   Elevated LDL cholesterol level 07/30/2015   Elevated TSH 07/30/2015   Family history of breast cancer in Mother 07/20/2015   GERD (gastroesophageal reflux disease) 07/20/2015   Generalized OA 07/20/2015   Hyperlipidemia 07/20/2015   Environmental and seasonal allergies 07/20/2015   Spinal stenosis: Chronic back pain  07/20/2015   Encounter for screening mammogram for breast cancer 07/16/2015   Neoplasm of connective tissue 11/23/2011   Past Medical History:  Diagnosis Date   Allergy    Arthritis    Environmental and seasonal allergies 07/20/2015   Family history of breast cancer in Mother 07/20/2015   Mother was diagnosed at 64 years old and died age 39 of breast cancer    GERD (gastroesophageal reflux disease)    Hyperlipidemia    Obesity 07/20/2015   Spinal stenosis     Family History  Problem Relation Age of Onset   Breast cancer Mother    Diabetes Father    Hypertension Father    Heart disease Father     Past Surgical History:  Procedure Laterality Date   APPENDECTOMY     BREAST BIOPSY Right    core biopsy     Social  History   Occupational History   Not on file  Tobacco Use   Smoking status: Former    Packs/day: 0.25    Years: 3.00    Pack years: 0.75    Types: Cigarettes    Quit date: 01/11/1975    Years since quitting: 45.9   Smokeless tobacco: Never  Vaping Use   Vaping Use: Never used  Substance and Sexual Activity   Alcohol use: Yes    Comment: socially   Drug use: No   Sexual activity: Yes

## 2021-01-28 ENCOUNTER — Other Ambulatory Visit: Payer: Self-pay

## 2021-01-28 ENCOUNTER — Encounter: Payer: Self-pay | Admitting: Orthopaedic Surgery

## 2021-01-28 ENCOUNTER — Ambulatory Visit: Payer: Medicare PPO | Admitting: Orthopaedic Surgery

## 2021-01-28 DIAGNOSIS — Z9889 Other specified postprocedural states: Secondary | ICD-10-CM

## 2021-01-28 NOTE — Progress Notes (Signed)
Post-Op Visit Note   Patient: Paula Conway           Date of Birth: 01/12/1955           MRN: [id number] Visit Date: 01/28/2021 PCP: Lorrene Reid, PA-C   Assessment & Plan:  Chief Complaint:  Chief Complaint  Patient presents with   Left Knee - Routine Post Op, Follow-up   Visit Diagnoses:  1. S/P arthroscopy of left knee     Plan: Praise is 6 weeks s/p left knee scope PMM.  Overall doing well.  Reports some startup pain and stiffness.  Presurgical meniscus pain is gone.  Examination of left knee shows fully healed surgical scars.  Excellent ROM.  At this point, [place] has recovered from the surgery and released to activity as tolerated.    Follow-Up Instructions: No follow-ups on file.   Orders:  No orders of the defined types were placed in this encounter.  No orders of the defined types were placed in this encounter.   Imaging: No results found.  PMFS History: Patient Active Problem List   Diagnosis Date Noted   Acute medial meniscus tear of left knee 09/04/2020   Chronic pain of right knee 08/05/2020   Statin declined 11/16/2018   Hypertriglyceridemia 11/16/2018   Grief reaction- loss of husband [name] Aug 8th, 2020 10/02/2018   Reaction, situational, acute, to stress 10/02/2018   Dysfunction of both eustachian tubes 07/24/2017   BPPV (benign paroxysmal positional vertigo), unspecified laterality 07/24/2017   Vitamin D insufficiency 03/15/2017   Lumbar discogenic pain syndrome 02/15/2017   Radiculopathy, lumbar region 02/15/2017   Arthritis of facet joints at multiple vertebral levels 02/15/2017   Low back pain 02/15/2017   Glucose intolerance (impaired glucose tolerance) 09/14/2016   Obesity, Class I, BMI 30-34.9 08/30/2016   Health education/counseling 08/30/2016   Prediabetes 07/30/2015   Elevated LDL cholesterol level 07/30/2015   Elevated TSH 07/30/2015   Family history of breast cancer in Mother 07/20/2015   GERD (gastroesophageal reflux  disease) 07/20/2015   Generalized OA 07/20/2015   Hyperlipidemia 07/20/2015   Environmental and seasonal allergies 07/20/2015   Spinal stenosis: Chronic back pain  07/20/2015   Encounter for screening mammogram for breast cancer 07/16/2015   Neoplasm of connective tissue 11/23/2011   Past Medical History:  Diagnosis Date   Allergy    Arthritis    Environmental and seasonal allergies 07/20/2015   Family history of breast cancer in Mother 07/20/2015   Mother was diagnosed at 52 years old and died age 70 of breast cancer    GERD (gastroesophageal reflux disease)    Hyperlipidemia    Obesity 07/20/2015   Spinal stenosis     Family History  Problem Relation Age of Onset   Breast cancer Mother    Diabetes Father    Hypertension Father    Heart disease Father     Past Surgical History:  Procedure Laterality Date   APPENDECTOMY     BREAST BIOPSY Right    core biopsy     Social History   Occupational History   Not on file  Tobacco Use   Smoking status: Former    Packs/day: 0.25    Years: 3.00    Pack years: 0.75    Types: Cigarettes    Quit date: 01/11/1975    Years since quitting: 46.0   Smokeless tobacco: Never  Vaping Use   Vaping Use: Never used  Substance and Sexual Activity   Alcohol use: Yes  Comment: socially   Drug use: No   Sexual activity: Yes

## 2021-05-06 ENCOUNTER — Other Ambulatory Visit: Payer: Self-pay | Admitting: Physician Assistant

## 2021-05-06 DIAGNOSIS — E781 Pure hyperglyceridemia: Secondary | ICD-10-CM

## 2021-05-06 DIAGNOSIS — E785 Hyperlipidemia, unspecified: Secondary | ICD-10-CM

## 2021-05-07 ENCOUNTER — Other Ambulatory Visit: Payer: Self-pay | Admitting: Physician Assistant

## 2021-05-07 DIAGNOSIS — R7989 Other specified abnormal findings of blood chemistry: Secondary | ICD-10-CM

## 2021-05-07 DIAGNOSIS — E785 Hyperlipidemia, unspecified: Secondary | ICD-10-CM

## 2021-05-07 DIAGNOSIS — E559 Vitamin D deficiency, unspecified: Secondary | ICD-10-CM

## 2021-05-07 DIAGNOSIS — Z0000 Encounter for general adult medical examination without abnormal findings: Secondary | ICD-10-CM

## 2021-05-07 DIAGNOSIS — R7303 Prediabetes: Secondary | ICD-10-CM

## 2021-05-10 ENCOUNTER — Other Ambulatory Visit: Payer: Medicare PPO

## 2021-05-10 DIAGNOSIS — R7303 Prediabetes: Secondary | ICD-10-CM | POA: Diagnosis not present

## 2021-05-10 DIAGNOSIS — E785 Hyperlipidemia, unspecified: Secondary | ICD-10-CM | POA: Diagnosis not present

## 2021-05-10 DIAGNOSIS — Z0000 Encounter for general adult medical examination without abnormal findings: Secondary | ICD-10-CM | POA: Diagnosis not present

## 2021-05-10 DIAGNOSIS — R7989 Other specified abnormal findings of blood chemistry: Secondary | ICD-10-CM

## 2021-05-10 DIAGNOSIS — E559 Vitamin D deficiency, unspecified: Secondary | ICD-10-CM | POA: Diagnosis not present

## 2021-05-11 ENCOUNTER — Telehealth: Payer: Self-pay

## 2021-05-11 ENCOUNTER — Ambulatory Visit: Payer: Medicare PPO | Admitting: Orthopaedic Surgery

## 2021-05-11 ENCOUNTER — Encounter: Payer: Self-pay | Admitting: Orthopaedic Surgery

## 2021-05-11 VITALS — Ht 64.0 in | Wt 189.0 lb

## 2021-05-11 DIAGNOSIS — M170 Bilateral primary osteoarthritis of knee: Secondary | ICD-10-CM | POA: Diagnosis not present

## 2021-05-11 DIAGNOSIS — M1712 Unilateral primary osteoarthritis, left knee: Secondary | ICD-10-CM

## 2021-05-11 DIAGNOSIS — M1711 Unilateral primary osteoarthritis, right knee: Secondary | ICD-10-CM | POA: Diagnosis not present

## 2021-05-11 LAB — CBC
Hematocrit: 42.2 % (ref 34.0–46.6)
Hemoglobin: 13.7 g/dL (ref 11.1–15.9)
MCH: 29.9 pg (ref 26.6–33.0)
MCHC: 32.5 g/dL (ref 31.5–35.7)
MCV: 92 fL (ref 79–97)
Platelets: 227 x10E3/uL (ref 150–450)
RBC: 4.58 x10E6/uL (ref 3.77–5.28)
RDW: 12.7 % (ref 11.7–15.4)
WBC: 5.7 x10E3/uL (ref 3.4–10.8)

## 2021-05-11 LAB — HEMOGLOBIN A1C
Est. average glucose Bld gHb Est-mCnc: 123 mg/dL
Hgb A1c MFr Bld: 5.9 % — ABNORMAL HIGH (ref 4.8–5.6)

## 2021-05-11 LAB — COMPREHENSIVE METABOLIC PANEL
ALT: 15 IU/L (ref 0–32)
AST: 21 IU/L (ref 0–40)
Albumin/Globulin Ratio: 2.1 (ref 1.2–2.2)
Albumin: 4.7 g/dL (ref 3.8–4.8)
Alkaline Phosphatase: 81 IU/L (ref 44–121)
BUN/Creatinine Ratio: 15 (ref 12–28)
BUN: 13 mg/dL (ref 8–27)
Bilirubin Total: 0.4 mg/dL (ref 0.0–1.2)
CO2: 22 mmol/L (ref 20–29)
Calcium: 9.8 mg/dL (ref 8.7–10.3)
Chloride: 102 mmol/L (ref 96–106)
Creatinine, Ser: 0.85 mg/dL (ref 0.57–1.00)
Globulin, Total: 2.2 g/dL (ref 1.5–4.5)
Glucose: 93 mg/dL (ref 70–99)
Potassium: 4.8 mmol/L (ref 3.5–5.2)
Sodium: 138 mmol/L (ref 134–144)
Total Protein: 6.9 g/dL (ref 6.0–8.5)
eGFR: 76 mL/min/{1.73_m2} (ref >59)

## 2021-05-11 LAB — LIPID PANEL
Chol/HDL Ratio: 2.9 ratio (ref 0.0–4.4)
Cholesterol, Total: 170 mg/dL (ref 100–199)
HDL: 58 mg/dL (ref >39)
LDL Chol Calc (NIH): 84 mg/dL (ref 0–99)
Triglycerides: 163 mg/dL — ABNORMAL HIGH (ref 0–149)
VLDL Cholesterol Cal: 28 mg/dL (ref 5–40)

## 2021-05-11 LAB — VITAMIN D 25 HYDROXY (VIT D DEFICIENCY, FRACTURES): Vit D, 25-Hydroxy: 74.8 ng/mL (ref 30.0–100.0)

## 2021-05-11 LAB — TSH: TSH: 5.25 u[IU]/mL — ABNORMAL HIGH (ref 0.450–4.500)

## 2021-05-11 NOTE — Telephone Encounter (Signed)
Please submit for bil knee gel injections  ?

## 2021-05-11 NOTE — Progress Notes (Signed)
? ?Office Visit Note ?  ?Patient: Paula Conway           ?Date of Birth: 1955/05/27           ?MRN: [id number] ?Visit Date: 05/11/2021 ?             ?Requested by: Lorrene Reid, PA-C ?[address] ?[address] ?[place],  [place] [address] ?PCP: Lorrene Reid, PA-C ? ? ?Assessment & Plan: ?Visit Diagnoses:  ?1. Bilateral primary osteoarthritis of knee   ? ? ?Plan: Impression is bilateral knee osteoarthritis.  Today, we discussed repeat cortisone injection versus obtaining approval for viscosupplementation injection.  She notes that she is headed to her daughter's house tomorrow where she will help take care of her 89-year-old grandson and would like to get both knees injected with cortisone today.  She would also like to get approval for viscosupplementation injections.  She will follow-up with [name] once approved.  Call with concerns or questions. ? ?Follow-Up Instructions: Return for once approved for bilateral visco inj.  ? ?Orders:  ?Orders Placed This Encounter  ?Procedures  ? Large Joint Inj: bilateral knee  ? ?No orders of the defined types were placed in this encounter. ? ? ? ? Procedures: ?Large Joint Inj: bilateral knee on 05/11/2021 8:49 AM ?Indications: pain ?Details: 22 G needle, anterolateral approach ?Medications (Right): 0.66 mL bupivacaine 0.25 %; 3 mL lidocaine 1 %; 13.33 mg methylPREDNISolone acetate 40 MG/ML ?Medications (Left): 0.66 mL bupivacaine 0.25 %; 3 mL lidocaine 1 %; 13.33 mg methylPREDNISolone acetate 40 MG/ML ? ? ? ? ?Clinical Data: ?No additional findings. ? ? ?Subjective: ?Chief Complaint  ?Patient presents with  ? Left Knee - Pain  ? Right Knee - Pain  ? ? ?HPI patient is a pleasant 66 year old female who comes in today with bilateral knee pain right greater than left.  History of osteoarthritis to both knees.  She was seen in our office in November where her right knee was injected with cortisone.  This seemed to dull her symptoms.  Her pain has returned and is described as constant  worsening anteromedial aspect.  She has associated swelling.  She denies any mechanical symptoms.  Pain is worse with stair climbing as well as going from a seated to standing position.  She takes occasional over-the-counter pain medication.  In regards to the left knee, she is status post left knee arthroscopic debridement medial meniscus.  It was noted during operative mention she had grade 4 changes to the medial patellofemoral compartments with grade 2-3 changes to the lateral compartment.  She notes that her symptoms have improved following surgery but continues to have pain.  She has not had a cortisone injection to the left knee since surgery.  No history of viscosupplementation injection either knee. ? ?Review of Systems as detailed in HPI.  All others reviewed and are negative. ? ? ?Objective: ?Vital Signs: Ht '5\' 4"'$  (1.626 m)   Wt 189 lb (85.7 kg)   BMI 32.44 kg/m?  ? ?Physical Exam well-developed well-nourished female no acute distress.  Alert and oriented x3. ? ?Ortho Exam right knee exam shows moderate effusion.  Range of motion 0 to 115 degrees.  Minimal medial joint line tenderness.  Moderate patellofemoral crepitus.  Left knee exam shows trace effusion.  Range of motion 0 to 115 degrees.  No joint line tenderness.  She is neurovascular intact distally. ? ?Specialty Comments:  ?No specialty comments available. ? ?Imaging: ?No new imaging ? ? ?PMFS History: ?Patient Active  Problem List  ? Diagnosis Date Noted  ? Acute medial meniscus tear of left knee 09/04/2020  ? Chronic pain of right knee 08/05/2020  ? Statin declined 11/16/2018  ? Hypertriglyceridemia 11/16/2018  ? Grief reaction- loss of husband [name] Aug 8th, 2020 10/02/2018  ? Reaction, situational, acute, to stress 10/02/2018  ? Dysfunction of both eustachian tubes 07/24/2017  ? BPPV (benign paroxysmal positional vertigo), unspecified laterality 07/24/2017  ? Vitamin D insufficiency 03/15/2017  ? Lumbar discogenic pain syndrome 02/15/2017  ?  Radiculopathy, lumbar region 02/15/2017  ? Arthritis of facet joints at multiple vertebral levels 02/15/2017  ? Low back pain 02/15/2017  ? Glucose intolerance (impaired glucose tolerance) 09/14/2016  ? Obesity, Class I, BMI 30-34.9 08/30/2016  ? Health education/counseling 08/30/2016  ? Prediabetes 07/30/2015  ? Elevated LDL cholesterol level 07/30/2015  ? Elevated TSH 07/30/2015  ? Family history of breast cancer in Mother 07/20/2015  ? GERD (gastroesophageal reflux disease) 07/20/2015  ? Generalized OA 07/20/2015  ? Hyperlipidemia 07/20/2015  ? Environmental and seasonal allergies 07/20/2015  ? Spinal stenosis: Chronic back pain  07/20/2015  ? Encounter for screening mammogram for breast cancer 07/16/2015  ? Neoplasm of connective tissue 11/23/2011  ? ?Past Medical History:  ?Diagnosis Date  ? Allergy   ? Arthritis   ? Environmental and seasonal allergies 07/20/2015  ? Family history of breast cancer in Mother 07/20/2015  ? Mother was diagnosed at 46 years old and died age 35 of breast cancer   ? GERD (gastroesophageal reflux disease)   ? Hyperlipidemia   ? Obesity 07/20/2015  ? Spinal stenosis   ?  ?Family History  ?Problem Relation Age of Onset  ? Breast cancer Mother   ? Diabetes Father   ? Hypertension Father   ? Heart disease Father   ?  ?Past Surgical History:  ?Procedure Laterality Date  ? APPENDECTOMY    ? BREAST BIOPSY Right   ? core biopsy    ? ?Social History  ? ?Occupational History  ? Not on file  ?Tobacco Use  ? Smoking status: Former  ?  Packs/day: 0.25  ?  Years: 3.00  ?  Pack years: 0.75  ?  Types: Cigarettes  ?  Quit date: 01/11/1975  ?  Years since quitting: 46.3  ? Smokeless tobacco: Never  ?Vaping Use  ? Vaping Use: Never used  ?Substance and Sexual Activity  ? Alcohol use: Yes  ?  Comment: socially  ? Drug use: No  ? Sexual activity: Yes  ? ? ? ? ? ? ?

## 2021-05-12 MED ORDER — LIDOCAINE HCL 1 % IJ SOLN
3.0000 mL | INTRAMUSCULAR | Status: AC | PRN
  Administered 2021-05-11: 3 mL

## 2021-05-12 MED ORDER — METHYLPREDNISOLONE ACETATE 40 MG/ML IJ SUSP
13.3300 mg | INTRAMUSCULAR | Status: AC | PRN
  Administered 2021-05-11: 13.33 mg via INTRA_ARTICULAR

## 2021-05-12 MED ORDER — BUPIVACAINE HCL 0.25 % IJ SOLN
0.6600 mL | INTRAMUSCULAR | Status: AC | PRN
  Administered 2021-05-11: .66 mL via INTRA_ARTICULAR

## 2021-05-12 NOTE — Telephone Encounter (Signed)
Noted  

## 2021-05-17 ENCOUNTER — Encounter: Payer: Self-pay | Admitting: Physician Assistant

## 2021-05-17 ENCOUNTER — Ambulatory Visit (INDEPENDENT_AMBULATORY_CARE_PROVIDER_SITE_OTHER): Payer: Medicare PPO | Admitting: Physician Assistant

## 2021-05-17 VITALS — BP 133/66 | HR 64 | Temp 97.7°F | Ht 64.0 in | Wt 189.0 lb

## 2021-05-17 DIAGNOSIS — Z0000 Encounter for general adult medical examination without abnormal findings: Secondary | ICD-10-CM | POA: Diagnosis not present

## 2021-05-17 DIAGNOSIS — R7989 Other specified abnormal findings of blood chemistry: Secondary | ICD-10-CM

## 2021-05-17 DIAGNOSIS — M5416 Radiculopathy, lumbar region: Secondary | ICD-10-CM | POA: Diagnosis not present

## 2021-05-17 DIAGNOSIS — M5126 Other intervertebral disc displacement, lumbar region: Secondary | ICD-10-CM

## 2021-05-17 DIAGNOSIS — Z1211 Encounter for screening for malignant neoplasm of colon: Secondary | ICD-10-CM | POA: Diagnosis not present

## 2021-05-17 MED ORDER — PREGABALIN 75 MG PO CAPS: 75.0000 mg | ORAL_CAPSULE | Freq: Two times a day (BID) | ORAL | 1 refills | Status: DC

## 2021-05-17 NOTE — Patient Instructions (Signed)
Preventive Care 7 Years and Older, Female ?Preventive care refers to lifestyle choices and visits with your health care provider that can promote health and wellness. Preventive care visits are also called wellness exams. ?What can I expect for my preventive care visit? ?Counseling ?Your health care provider may ask you questions about your: ?Medical history, including: ?Past medical problems. ?Family medical history. ?Pregnancy and menstrual history. ?History of falls. ?Current health, including: ?Memory and ability to understand (cognition). ?Emotional well-being. ?Home life and relationship well-being. ?Sexual activity and sexual health. ?Lifestyle, including: ?Alcohol, nicotine or tobacco, and drug use. ?Access to firearms. ?Diet, exercise, and sleep habits. ?Work and work Statistician. ?Sunscreen use. ?Safety issues such as seatbelt and bike helmet use. ?Physical exam ?Your health care provider will check your: ?Height and weight. These may be used to calculate your BMI (body mass index). BMI is a measurement that tells if you are at a healthy weight. ?Waist circumference. This measures the distance around your waistline. This measurement also tells if you are at a healthy weight and may help predict your risk of certain diseases, such as type 2 diabetes and high blood pressure. ?Heart rate and blood pressure. ?Body temperature. ?Skin for abnormal spots. ?What immunizations do I need? ? ?Vaccines are usually given at various ages, according to a schedule. Your health care provider will recommend vaccines for you based on your age, medical history, and lifestyle or other factors, such as travel or where you work. ?What tests do I need? ?Screening ?Your health care provider may recommend screening tests for certain conditions. This may include: ?Lipid and cholesterol levels. ?Hepatitis C test. ?Hepatitis B test. ?HIV (human immunodeficiency virus) test. ?STI (sexually transmitted infection) testing, if you are at  risk. ?Lung cancer screening. ?Colorectal cancer screening. ?Diabetes screening. This is done by checking your blood sugar (glucose) after you have not eaten for a while (fasting). ?Mammogram. Talk with your health care provider about how often you should have regular mammograms. ?BRCA-related cancer screening. This may be done if you have a family history of breast, ovarian, tubal, or peritoneal cancers. ?Bone density scan. This is done to screen for osteoporosis. ?Talk with your health care provider about your test results, treatment options, and if necessary, the need for more tests. ?Follow these instructions at home: ?Eating and drinking ? ?Eat a diet that includes fresh fruits and vegetables, whole grains, lean protein, and low-fat dairy products. Limit your intake of foods with high amounts of sugar, saturated fats, and salt. ?Take vitamin and mineral supplements as recommended by your health care provider. ?Do not drink alcohol if your health care provider tells you not to drink. ?If you drink alcohol: ?Limit how much you have to 0-1 drink a day. ?Know how much alcohol is in your drink. In the [place] one drink equals one 12 oz bottle of beer (355 mL), one 5 oz glass of wine (148 mL), or one 1? oz glass of hard liquor (44 mL). ?Lifestyle ?Brush your teeth every morning and night with fluoride toothpaste. Floss one time each day. ?Exercise for at least 30 minutes 5 or more days each week. ?Do not use any products that contain nicotine or tobacco. These products include cigarettes, chewing tobacco, and vaping devices, such as e-cigarettes. If you need help quitting, ask your health care provider. ?Do not use drugs. ?If you are sexually active, practice safe sex. Use a condom or other form of protection in order to prevent STIs. ?Take aspirin only as told by  your health care provider. Make sure that you understand how much to take and what form to take. Work with your health care provider to find out whether it  is safe and beneficial for you to take aspirin daily. ?Ask your health care provider if you need to take a cholesterol-lowering medicine (statin). ?Find healthy ways to manage stress, such as: ?Meditation, yoga, or listening to music. ?Journaling. ?Talking to a trusted person. ?Spending time with friends and family. ?Minimize exposure to UV radiation to reduce your risk of skin cancer. ?Safety ?Always wear your seat belt while driving or riding in a vehicle. ?Do not drive: ?If you have been drinking alcohol. Do not ride with someone who has been drinking. ?When you are tired or distracted. ?While texting. ?If you have been using any mind-altering substances or drugs. ?Wear a helmet and other protective equipment during sports activities. ?If you have firearms in your house, make sure you follow all gun safety procedures. ?What's next? ?Visit your health care provider once a year for an annual wellness visit. ?Ask your health care provider how often you should have your eyes and teeth checked. ?Stay up to date on all vaccines. ?This information is not intended to replace advice given to you by your health care provider. Make sure you discuss any questions you have with your health care provider. ?Document Revised: 06/24/2020 Document Reviewed: 06/24/2020 ?Elsevier Patient Education ? [place]. ? ?

## 2021-05-17 NOTE — Progress Notes (Signed)
? ?Subjective:  ? Paula Conway is a 66 y.o. female who presents for Medicare Annual (Subsequent) preventive examination. ? ?Review of Systems    ?General:   No F/C, wt loss ?Pulm:   No DIB, SOB, pleuritic chest pain ?Card:  No CP, palpitations ?Abd:  No n/v/d or pain ?Ext:  No edema ?   ?Objective:  ?  ?Today's Vitals  ? 05/17/21 1005  ?BP: 133/66  ?Pulse: 64  ?Temp: 97.7 ?F (36.5 ?C)  ?SpO2: 99%  ?Weight: 189 lb (85.7 kg)  ?Height: '5\' 4"'$  (1.626 m)  ? ?Body mass index is 32.44 kg/m?. ? ? ?  07/10/2019  ?  9:25 AM 02/20/2017  ?  8:00 AM 07/16/2015  ?  3:29 PM  ?Advanced Directives  ?Does Patient Have a Medical Advance Directive? No No No  ?Would patient like information on creating a medical advance directive?  No - Patient declined No - patient declined information  ? ? ?Current Medications (verified) ?Outpatient Encounter Medications as of 05/17/2021  ?Medication Sig  ? Ascorbic Acid (VITAMIN C) 1000 MG tablet Take 1,000 mg by mouth daily.  ? Cholecalciferol (VITAMIN D3) 5000 units CAPS Take 5,000 Units by mouth daily.  ? loratadine (CLARITIN) 10 MG tablet Take 10 mg by mouth daily.  ? Multiple Vitamin (MULTIVITAMIN) tablet Take 1 tablet by mouth daily.  ? Probiotic Product (PROBIOTIC PO) Take 1 tablet by mouth daily.  ? rosuvastatin (CRESTOR) 5 MG tablet TAKE 1 TABLET BY MOUTH AT BEDTIME.  ? Turmeric (QC TUMERIC COMPLEX PO) Take 1 tablet by mouth once.  ? [DISCONTINUED] pregabalin (LYRICA) 75 MG capsule TAKE 1 CAPSULE BY MOUTH 2 TIMES DAILY.  ? pregabalin (LYRICA) 75 MG capsule Take 1 capsule (75 mg total) by mouth 2 (two) times daily.  ? ?No facility-administered encounter medications on file as of 05/17/2021.  ? ? ?Allergies (verified) ?Patient has no known allergies.  ? ?History: ?Past Medical History:  ?Diagnosis Date  ? Allergy   ? Arthritis   ? Environmental and seasonal allergies 07/20/2015  ? Family history of breast cancer in Mother 07/20/2015  ? Mother was diagnosed at 63 years old and died age 30 of breast  cancer   ? GERD (gastroesophageal reflux disease)   ? Hyperlipidemia   ? Obesity 07/20/2015  ? Spinal stenosis   ? ?Past Surgical History:  ?Procedure Laterality Date  ? APPENDECTOMY    ? BREAST BIOPSY Right   ? core biopsy    ? ?Family History  ?Problem Relation Age of Onset  ? Breast cancer Mother   ? Diabetes Father   ? Hypertension Father   ? Heart disease Father   ? ?Social History  ? ?Socioeconomic History  ? Marital status: Widowed  ?  Spouse name: Not on file  ? Number of children: 2  ? Years of education: Not on file  ? Highest education level: Not on file  ?Occupational History  ? Not on file  ?Tobacco Use  ? Smoking status: Former  ?  Packs/day: 0.25  ?  Years: 3.00  ?  Pack years: 0.75  ?  Types: Cigarettes  ?  Quit date: 01/11/1975  ?  Years since quitting: 46.3  ? Smokeless tobacco: Never  ?Vaping Use  ? Vaping Use: Never used  ?Substance and Sexual Activity  ? Alcohol use: Yes  ?  Comment: socially  ? Drug use: No  ? Sexual activity: Yes  ?Other Topics Concern  ? Not on file  ?Social  History Narrative  ? Not on file  ? ?Social Determinants of Health  ? ?Financial Resource Strain: Low Risk   ? Difficulty of Paying Living Expenses: Not hard at all  ?Food Insecurity: No Food Insecurity  ? Worried About Charity fundraiser in the Last Year: Never true  ? Ran Out of Food in the Last Year: Never true  ?Transportation Needs: No Transportation Needs  ? Lack of Transportation (Medical): No  ? Lack of Transportation (Non-Medical): No  ?Physical Activity: Inactive  ? Days of Exercise per Week: 0 days  ? Minutes of Exercise per Session: 0 min  ?Stress: No Stress Concern Present  ? Feeling of Stress : Only a little  ?Social Connections: Moderately Integrated  ? Frequency of Communication with Friends and Family: More than three times a week  ? Frequency of Social Gatherings with Friends and Family: Once a week  ? Attends Religious Services: 1 to 4 times per year  ? Active Member of Clubs or Organizations: Yes  ?  Attends Archivist Meetings: 1 to 4 times per year  ? Marital Status: Widowed  ? ? ?Tobacco Counseling ?Counseling given: Not Answered ? ? ? ?Diabetic?NO ? ? ? ?Activities of Daily Living ? ?  05/17/2021  ? 10:08 AM 12/16/2020  ?  2:56 PM  ?In your present state of health, do you have any difficulty performing the following activities:  ?Hearing? 1 0  ?Vision? 1 0  ?Difficulty concentrating or making decisions? 1 0  ?Walking or climbing stairs? 1 1  ?Dressing or bathing? 0 0  ?Doing errands, shopping? 0 0  ? ? ?Patient Care Team: ?Lorrene Reid, PA-C as PCP - General ? ?Indicate any recent Medical Services you may have received from other than [facility] providers in the past year (date may be approximate). ? ?   ?Assessment:  ? This is a routine wellness examination for Paula Conway. ? ?Hearing/Vision screen ?No results found. ? ?Dietary issues and exercise activities discussed: ?-Activity had been limited by knee osteoarthritis and recently had cortisone injections have helped. Is in the process of getting gel injections approved (followed by ortho). Trying to eat more whole grains but likes to eat kettle chips.  ? ? Goals Addressed   ?None ?  ?Depression Screen ? ?  05/17/2021  ? 10:08 AM 12/16/2020  ?  2:56 PM 08/10/2020  ? 10:42 AM 06/16/2020  ?  3:35 PM 04/08/2020  ?  9:33 AM 12/10/2019  ?  8:59 AM 09/02/2019  ?  9:22 AM  ?PHQ 2/9 Scores  ?PHQ - 2 Score 0 0 1 0 2 0 0  ?PHQ- 9 Score '6 3 5 2 6 1 3  '$ ?  ?Fall Risk ? ?  05/17/2021  ? 10:07 AM 12/16/2020  ?  2:56 PM 08/10/2020  ? 10:41 AM 06/16/2020  ?  3:35 PM 04/08/2020  ?  9:32 AM  ?Fall Risk   ?Falls in the past year? '1 1 1 1 1  '$ ?Number falls in past yr: 0 0 0 0   ?Injury with Fall? 0 '1 1 1   '$ ?Risk for fall due to : History of fall(s) History of fall(s) No Fall Risks History of fall(s)   ?Follow up Falls evaluation completed Falls evaluation completed Falls evaluation completed Falls evaluation completed Falls evaluation completed  ? ? ?FALL RISK PREVENTION PERTAINING TO THE  HOME: ? ?Any stairs in or around the home? Yes  ?If so, are there any without handrails? Yes  ?  Home free of loose throw rugs in walkways, pet beds, electrical cords, etc? Yes  ?Adequate lighting in your home to reduce risk of falls? Yes  ? ?ASSISTIVE DEVICES UTILIZED TO PREVENT FALLS: ? ?Life alert? No  ?Use of a cane, walker or w/c? No  ?Grab bars in the bathroom? No  ?Shower chair or bench in shower? No  ?Elevated toilet seat or a handicapped toilet? Yes  ? ?TIMED UP AND GO: ? ?Was the test performed? Yes .  ?Length of time to ambulate 10 feet: 78 sec.  ? ?Gait steady and fast without use of assistive device ? ?Cognitive Function: wnl's ?  ?  ? ?  05/17/2021  ? 10:10 AM  ?6CIT Screen  ?What Year? 0 points  ?What month? 0 points  ?What time? 0 points  ?Count back from 20 0 points  ?Months in reverse 0 points  ?Repeat phrase 0 points  ?Total Score 0 points  ? ? ?Immunizations ?Immunization History  ?Administered Date(s) Administered  ? PFIZER(Purple Top)SARS-COV-2 Vaccination 03/28/2019, 04/18/2019, 12/10/2019  ? Tdap 10/28/2011  ? Zoster Recombinat (Shingrix) 01/14/2021  ? ? ?TDAP status: Due, Education has been provided regarding the importance of this vaccine. Advised may receive this vaccine at local pharmacy or Health Dept. Aware to provide a copy of the vaccination record if obtained from local pharmacy or Health Dept. Verbalized acceptance and understanding. ? ?Flu Vaccine status: Declined, Education has been provided regarding the importance of this vaccine but patient still declined. Advised may receive this vaccine at local pharmacy or Health Dept. Aware to provide a copy of the vaccination record if obtained from local pharmacy or Health Dept. Verbalized acceptance and understanding. ? ?Pneumococcal vaccine status: Up to date ? ?Covid-19 vaccine status: Completed vaccines ? ?Qualifies for Shingles Vaccine? Yes   ?Zostavax completed Yes   ?Shingrix Completed?: Yes ? ?Screening Tests ?Health Maintenance   ?Topic Date Due  ? COLONOSCOPY (Pts 45-107yr Insurance coverage will need to be confirmed)  06/20/2018  ? Pneumonia Vaccine 66 Years old (1 - PCV) Never done  ? DEXA SCAN  Never done  ? COVID-19 Vaccine (4 - Boost

## 2021-06-01 DIAGNOSIS — Z1211 Encounter for screening for malignant neoplasm of colon: Secondary | ICD-10-CM | POA: Diagnosis not present

## 2021-06-10 LAB — COLOGUARD: COLOGUARD: NEGATIVE

## 2021-06-11 ENCOUNTER — Ambulatory Visit
Admission: RE | Admit: 2021-06-11 | Discharge: 2021-06-11 | Disposition: A | Discharge status: Home / Self Care | Payer: Medicare PPO | Source: Ambulatory Visit | Attending: Physician Assistant | Admitting: Physician Assistant

## 2021-06-11 DIAGNOSIS — Z1211 Encounter for screening for malignant neoplasm of colon: Secondary | ICD-10-CM

## 2021-06-11 DIAGNOSIS — Z1231 Encounter for screening mammogram for malignant neoplasm of breast: Secondary | ICD-10-CM | POA: Diagnosis not present

## 2021-06-25 ENCOUNTER — Other Ambulatory Visit: Payer: Self-pay | Admitting: Physician Assistant

## 2021-06-25 DIAGNOSIS — E785 Hyperlipidemia, unspecified: Secondary | ICD-10-CM

## 2021-06-25 DIAGNOSIS — Z0000 Encounter for general adult medical examination without abnormal findings: Secondary | ICD-10-CM

## 2021-06-25 DIAGNOSIS — R7303 Prediabetes: Secondary | ICD-10-CM

## 2021-06-28 ENCOUNTER — Other Ambulatory Visit: Payer: Medicare PPO

## 2021-06-28 DIAGNOSIS — R946 Abnormal results of thyroid function studies: Secondary | ICD-10-CM | POA: Diagnosis not present

## 2021-06-28 DIAGNOSIS — Z0000 Encounter for general adult medical examination without abnormal findings: Secondary | ICD-10-CM

## 2021-06-28 DIAGNOSIS — E785 Hyperlipidemia, unspecified: Secondary | ICD-10-CM | POA: Diagnosis not present

## 2021-06-28 DIAGNOSIS — R7303 Prediabetes: Secondary | ICD-10-CM | POA: Diagnosis not present

## 2021-06-29 ENCOUNTER — Telehealth: Payer: Self-pay

## 2021-06-29 LAB — HEMOGLOBIN A1C
Est. average glucose Bld gHb Est-mCnc: 128 mg/dL
Hgb A1c MFr Bld: 6.1 % — ABNORMAL HIGH (ref 4.8–5.6)

## 2021-06-29 LAB — COMPREHENSIVE METABOLIC PANEL
ALT: 14 IU/L (ref 0–32)
AST: 16 IU/L (ref 0–40)
Albumin/Globulin Ratio: 2.3 — ABNORMAL HIGH (ref 1.2–2.2)
Albumin: 4.5 g/dL (ref 3.8–4.8)
Alkaline Phosphatase: 78 IU/L (ref 44–121)
BUN/Creatinine Ratio: 20 (ref 12–28)
BUN: 16 mg/dL (ref 8–27)
Bilirubin Total: 0.4 mg/dL (ref 0.0–1.2)
CO2: 21 mmol/L (ref 20–29)
Calcium: 9.5 mg/dL (ref 8.7–10.3)
Chloride: 104 mmol/L (ref 96–106)
Creatinine, Ser: 0.81 mg/dL (ref 0.57–1.00)
Globulin, Total: 2 g/dL (ref 1.5–4.5)
Glucose: 85 mg/dL (ref 70–99)
Potassium: 4 mmol/L (ref 3.5–5.2)
Sodium: 140 mmol/L (ref 134–144)
Total Protein: 6.5 g/dL (ref 6.0–8.5)
eGFR: 80 mL/min/{1.73_m2} (ref >59)

## 2021-06-29 LAB — LIPID PANEL
Chol/HDL Ratio: 3 ratio (ref 0.0–4.4)
Cholesterol, Total: 161 mg/dL (ref 100–199)
HDL: 54 mg/dL (ref >39)
LDL Chol Calc (NIH): 82 mg/dL (ref 0–99)
Triglycerides: 142 mg/dL (ref 0–149)
VLDL Cholesterol Cal: 25 mg/dL (ref 5–40)

## 2021-06-29 NOTE — Telephone Encounter (Signed)
Per Herb Grays I called [facility] and Spoke with April and added TSH and free T4 that was drawn on 06/28/2021.

## 2021-07-03 LAB — TSH+FREE T4
Free T4: 1.12 ng/dL (ref 0.82–1.77)
TSH: 4.92 u[IU]/mL — ABNORMAL HIGH (ref 0.450–4.500)

## 2021-07-03 LAB — SPECIMEN STATUS REPORT

## 2021-07-28 ENCOUNTER — Encounter: Payer: Self-pay | Admitting: Orthopaedic Surgery

## 2021-07-29 ENCOUNTER — Telehealth: Payer: Self-pay

## 2021-07-29 NOTE — Telephone Encounter (Signed)
Please precert for bilateral gel. This is Dr.Xu's patient.

## 2021-08-03 NOTE — Telephone Encounter (Signed)
Submited for VOB on [email or web address] 

## 2021-08-05 ENCOUNTER — Telehealth: Payer: Self-pay

## 2021-08-05 NOTE — Telephone Encounter (Signed)
[facility] LMVM for patient to call back about scheduling for bilateral knee monovisc injections $40 copay Once OOP met covered at 1005-patient has met $80 PA not required Deductible does not apply

## 2021-08-12 ENCOUNTER — Encounter: Payer: Self-pay | Admitting: Orthopaedic Surgery

## 2021-08-12 ENCOUNTER — Ambulatory Visit: Payer: Medicare PPO | Admitting: Orthopaedic Surgery

## 2021-08-12 DIAGNOSIS — M1712 Unilateral primary osteoarthritis, left knee: Secondary | ICD-10-CM | POA: Diagnosis not present

## 2021-08-12 DIAGNOSIS — M170 Bilateral primary osteoarthritis of knee: Secondary | ICD-10-CM | POA: Diagnosis not present

## 2021-08-12 DIAGNOSIS — M1711 Unilateral primary osteoarthritis, right knee: Secondary | ICD-10-CM

## 2021-08-12 MED ORDER — HYALURONAN 88 MG/4ML IX SOSY
88.0000 mg | PREFILLED_SYRINGE | INTRA_ARTICULAR | Status: AC | PRN
  Administered 2021-08-12: 88 mg via INTRA_ARTICULAR

## 2021-08-12 NOTE — Progress Notes (Signed)
Office Visit Note   Patient: Paula Conway           Date of Birth: [birth year]           MRN: [id number] Visit Date: 08/12/2021              Requested by: Lorrene Reid, PA-C [address] [place],  [place] [address] PCP: Lorrene Reid, PA-C   Assessment & Plan: Visit Diagnoses:  1. Bilateral primary osteoarthritis of knee     Plan: [name] is here for bilateral knee Monovisc injections.  She tolerated these injections well.  Follow-up as needed.  Follow-Up Instructions: No follow-ups on file.   Orders:  No orders of the defined types were placed in this encounter.  No orders of the defined types were placed in this encounter.     Procedures: Large Joint Inj: bilateral knee on 08/12/2021 10:28 AM Indications: pain Details: 22 G needle  Arthrogram: No  Medications (Right): 88 mg Hyaluronan 88 MG/4ML Medications (Left): 88 mg Hyaluronan 88 MG/4ML Outcome: tolerated well, no immediate complications Patient was prepped and draped in the usual sterile fashion.       Clinical Data: No additional findings.   Subjective: Chief Complaint  Patient presents with   Right Knee - Follow-up    monovisc   Left Knee - Follow-up    monovisc    HPI  Review of Systems   Objective: Vital Signs: There were no vitals taken for this visit.  Physical Exam  Ortho Exam  Specialty Comments:  No specialty comments available.  Imaging: No results found.   PMFS History: Patient Active Problem List   Diagnosis Date Noted   Bilateral primary osteoarthritis of knee 08/12/2021   Acute medial meniscus tear of left knee 09/04/2020   Chronic pain of right knee 08/05/2020   Statin declined 11/16/2018   Hypertriglyceridemia 11/16/2018   Grief reaction- loss of husband [name] Aug 8th, 2020 10/02/2018   Reaction, situational, acute, to stress 10/02/2018   Dysfunction of both eustachian tubes 07/24/2017   BPPV (benign paroxysmal positional vertigo),  unspecified laterality 07/24/2017   Vitamin D insufficiency 03/15/2017   Lumbar discogenic pain syndrome 02/15/2017   Radiculopathy, lumbar region 02/15/2017   Arthritis of facet joints at multiple vertebral levels 02/15/2017   Low back pain 02/15/2017   Glucose intolerance (impaired glucose tolerance) 09/14/2016   Obesity, Class I, BMI 30-34.9 08/30/2016   Health education/counseling 08/30/2016   Prediabetes 07/30/2015   Elevated LDL cholesterol level 07/30/2015   Elevated TSH 07/30/2015   Family history of breast cancer in Mother 07/20/2015   GERD (gastroesophageal reflux disease) 07/20/2015   Generalized OA 07/20/2015   Hyperlipidemia 07/20/2015   Environmental and seasonal allergies 07/20/2015   Spinal stenosis: Chronic back pain  07/20/2015   Encounter for screening mammogram for breast cancer 07/16/2015   Neoplasm of connective tissue 11/23/2011   Past Medical History:  Diagnosis Date   Allergy    Arthritis    Environmental and seasonal allergies 07/20/2015   Family history of breast cancer in Mother 07/20/2015   Mother was diagnosed at 66 years old and died age 52 of breast cancer    GERD (gastroesophageal reflux disease)    Hyperlipidemia    Obesity 07/20/2015   Spinal stenosis     Family History  Problem Relation Age of Onset   Breast cancer Mother    Diabetes Father    Hypertension Father    Heart disease Father  Past Surgical History:  Procedure Laterality Date   APPENDECTOMY     BREAST BIOPSY Right    core biopsy     Social History   Occupational History   Not on file  Tobacco Use   Smoking status: Former    Packs/day: 0.25    Years: 3.00    Total pack years: 0.75    Types: Cigarettes    Quit date: 01/11/1975    Years since quitting: 46.6   Smokeless tobacco: Never  Vaping Use   Vaping Use: Never used  Substance and Sexual Activity   Alcohol use: Yes    Comment: socially   Drug use: No   Sexual activity: Yes

## 2021-08-13 DIAGNOSIS — Z6832 Body mass index (BMI) 32.0-32.9, adult: Secondary | ICD-10-CM | POA: Diagnosis not present

## 2021-08-13 DIAGNOSIS — M1990 Unspecified osteoarthritis, unspecified site: Secondary | ICD-10-CM | POA: Diagnosis not present

## 2021-08-13 DIAGNOSIS — R32 Unspecified urinary incontinence: Secondary | ICD-10-CM | POA: Diagnosis not present

## 2021-08-13 DIAGNOSIS — F325 Major depressive disorder, single episode, in full remission: Secondary | ICD-10-CM | POA: Diagnosis not present

## 2021-08-13 DIAGNOSIS — M792 Neuralgia and neuritis, unspecified: Secondary | ICD-10-CM | POA: Diagnosis not present

## 2021-08-13 DIAGNOSIS — E669 Obesity, unspecified: Secondary | ICD-10-CM | POA: Diagnosis not present

## 2021-08-13 DIAGNOSIS — E785 Hyperlipidemia, unspecified: Secondary | ICD-10-CM | POA: Diagnosis not present

## 2021-08-13 DIAGNOSIS — R030 Elevated blood-pressure reading, without diagnosis of hypertension: Secondary | ICD-10-CM | POA: Diagnosis not present

## 2021-08-13 DIAGNOSIS — K219 Gastro-esophageal reflux disease without esophagitis: Secondary | ICD-10-CM | POA: Diagnosis not present

## 2021-09-07 IMAGING — CT CT ABD-PELV W/O CM
2 of 4 series · 16 of 46 positions shown, 18 images · non-contrast
Comparison: None.

CLINICAL DATA: Left lower quadrant abdominal pain and left flank
pain with nausea and vomiting since early this morning.

EXAM:
CT ABDOMEN AND PELVIS WITHOUT CONTRAST
TECHNIQUE: Multidetector CT imaging of the abdomen and pelvis was performed
following the standard protocol without IV contrast.

[Series 2: axial st · axial · 0.97mm/px · z∈[+1008,+1408]mm · 13 of 90 slices shown, 15 images]
[im 5/90  soft-tissue]
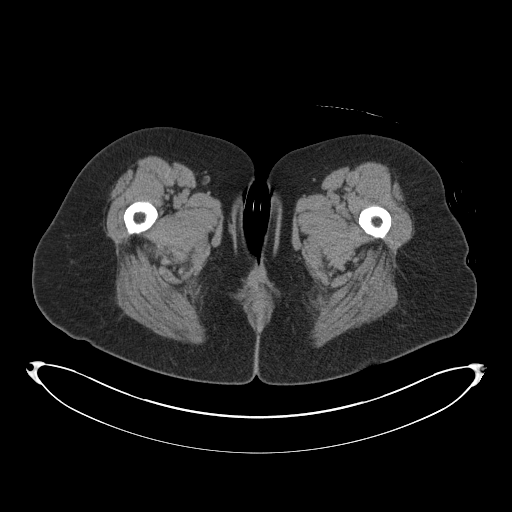
[im 5/90  bone]
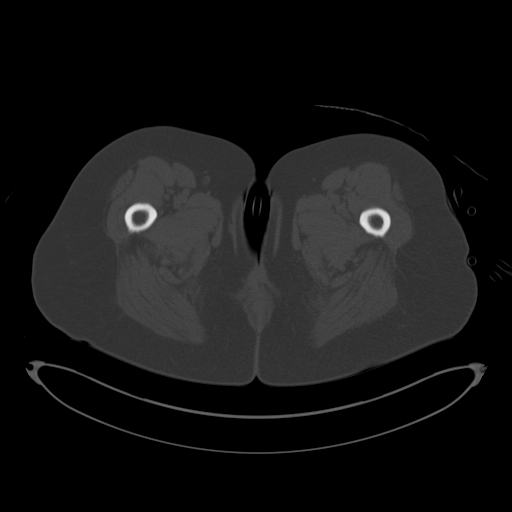
[im 10/90  soft-tissue]
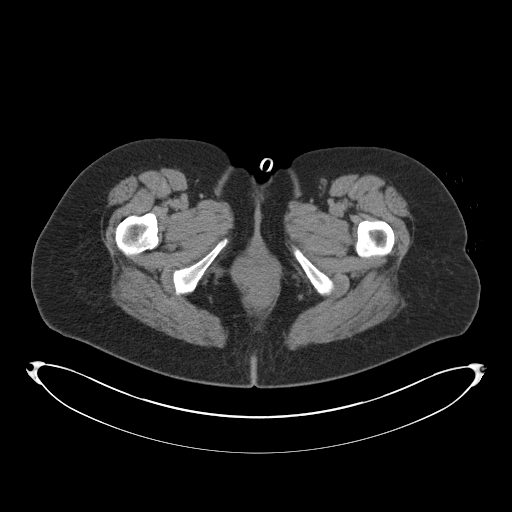
[im 20/90  soft-tissue]
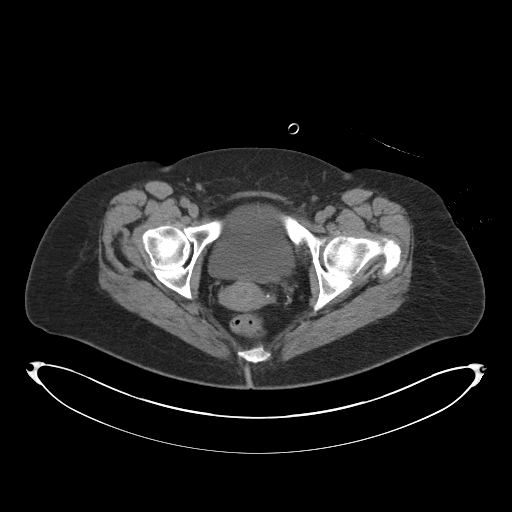
[im 25/90  soft-tissue]
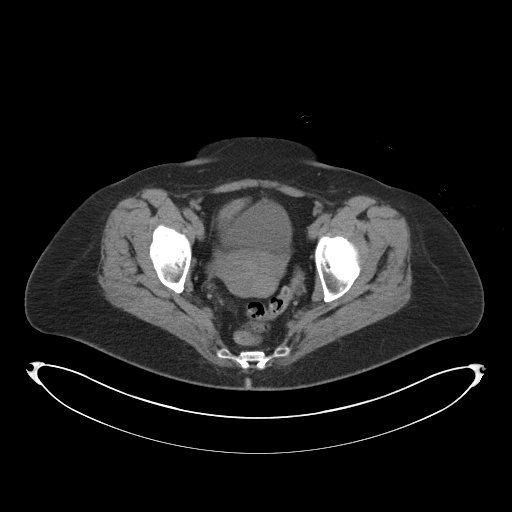
[im 30/90  soft-tissue]
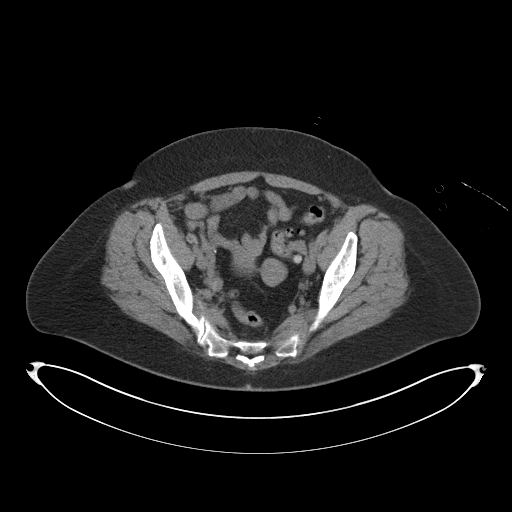
[im 40/90  soft-tissue]
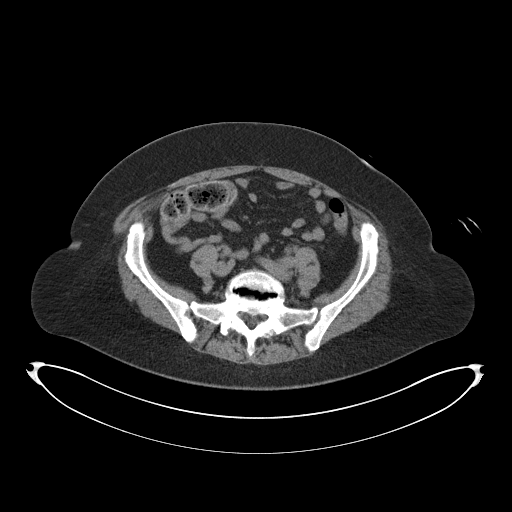
[im 45/90  soft-tissue]
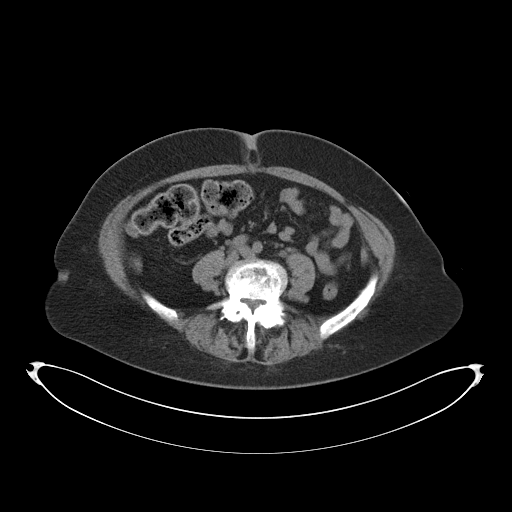
[im 50/90  soft-tissue]
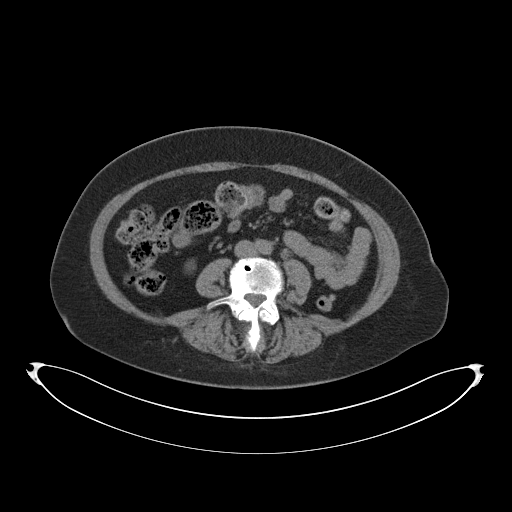
[im 60/90  soft-tissue]
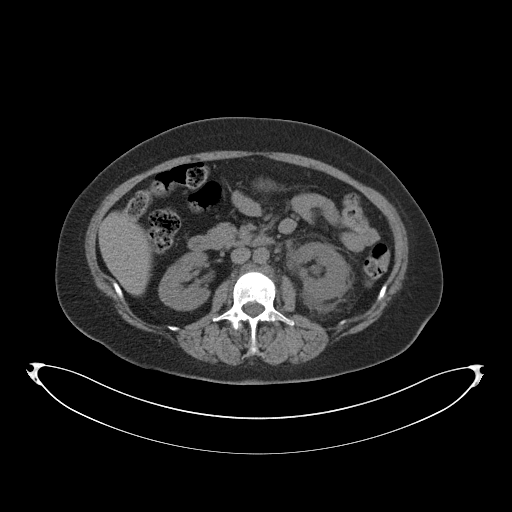
[im 60/90  bone]
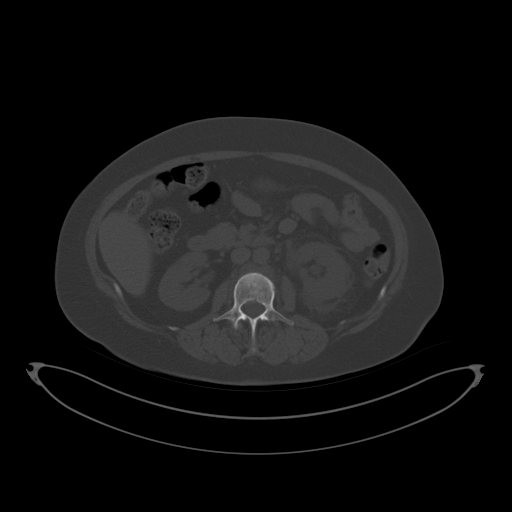
[im 65/90  soft-tissue]
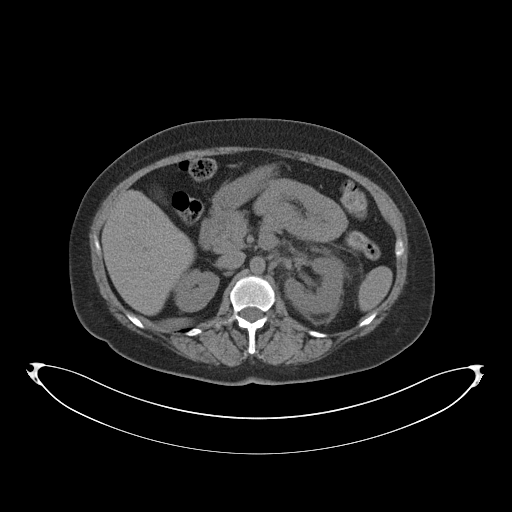
[im 70/90  soft-tissue]
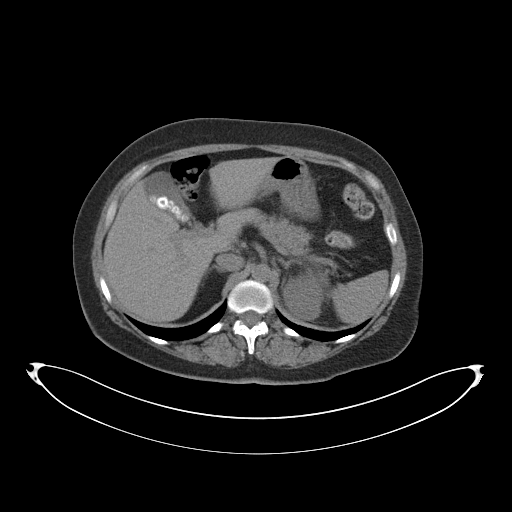
[im 80/90  soft-tissue]
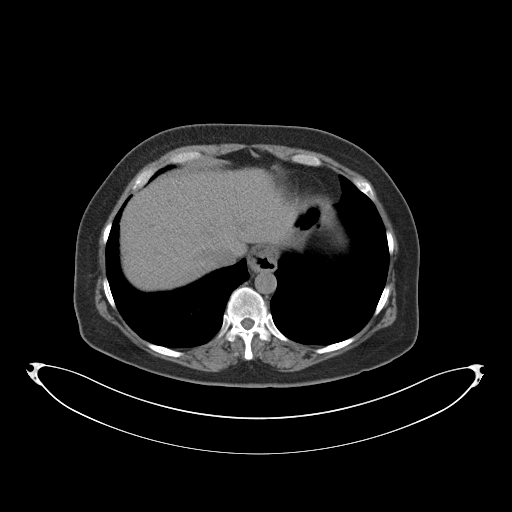
[im 85/90  soft-tissue]
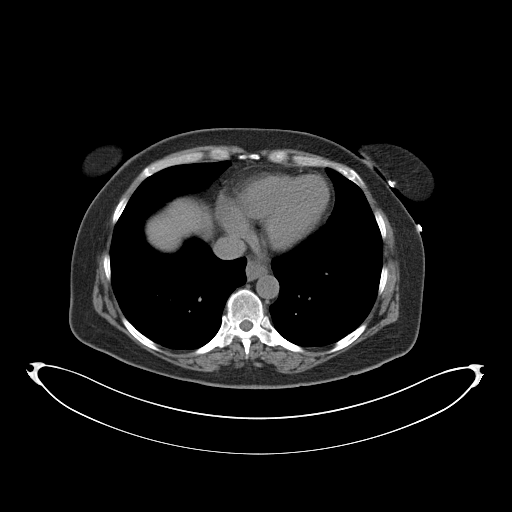

[Series 4: coronal · coronal · 0.71mm/px · 3 of 138 slices shown]
[im 46/138  soft-tissue]
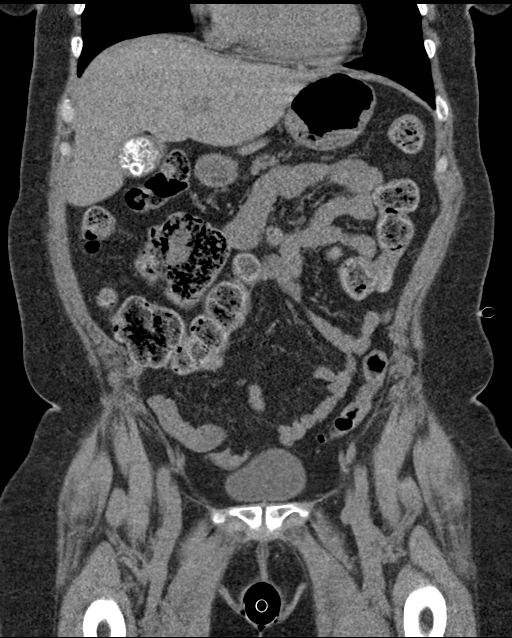
[im 61/138  soft-tissue]
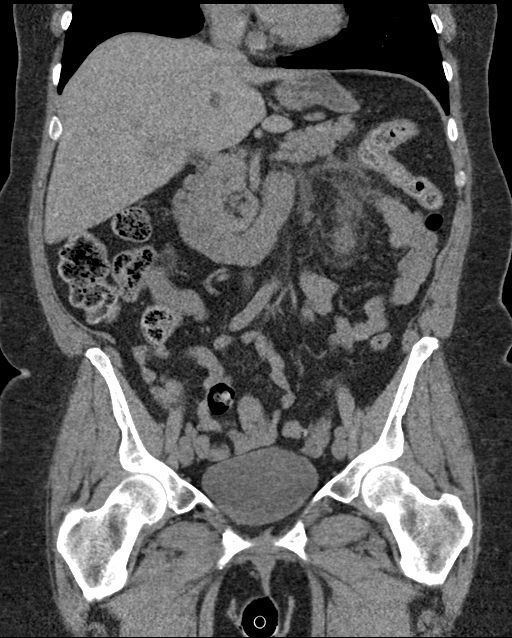
[im 77/138  soft-tissue]
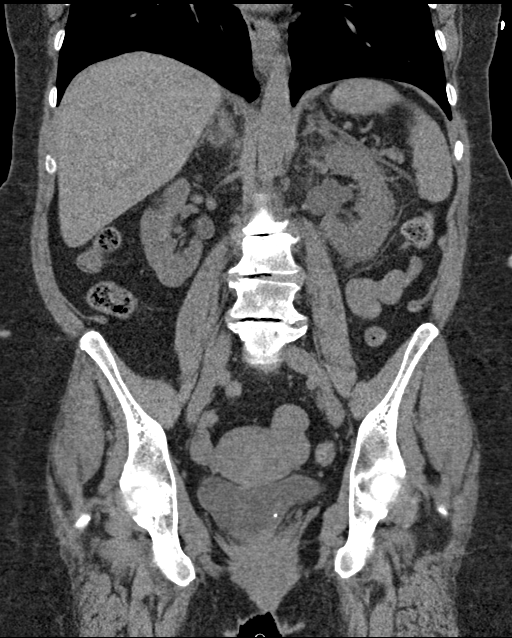

[16 of 46 positions shown; findings below may reference images not displayed]

FINDINGS: Lower chest: Small hiatal hernia.  Otherwise negative.

Hepatobiliary: Numerous gallstones. Gallbladder wall is not
thickened and the gallbladder is not distended. No biliary ductal
dilatation. Liver parenchyma is normal except for an 8 mm cyst in
the caudate lobe.

Pancreas: Unremarkable. No pancreatic ductal dilatation or
surrounding inflammatory changes.

Spleen: Normal in size without focal abnormality.

Adrenals/Urinary Tract: Left hydronephrosis with a tiny stone in the
left ureterovesical junction. Perinephric soft tissue stranding on
the left. No left renal calculi. 12 mm exophytic cyst in the mid
right kidney. Right kidney is otherwise normal. Bladder is normal.

Stomach/Bowel: Small hiatal hernia. Scattered diverticula in the
left side of the colon without diverticulitis. The bowel is
otherwise normal including the terminal ileum and appendix.

Vascular/Lymphatic: No significant vascular findings are present. No
enlarged abdominal or pelvic lymph nodes.

Reproductive: Uterus and bilateral adnexa are unremarkable.

Other: No abdominal wall hernia or abnormality. No abdominopelvic
ascites.

Musculoskeletal: No acute abnormality. Minimal arthritic changes of
both hips. Multilevel degenerative disc and joint disease in the
lumbar spine.
IMPRESSION: 1. Left hydronephrosis with a tiny stone in the left ureterovesical
junction.
2. Cholelithiasis.
3. Small hiatal hernia.
4. Diverticulosis of the left side of the colon.

## 2021-11-15 DIAGNOSIS — H00021 Hordeolum internum right upper eyelid: Secondary | ICD-10-CM | POA: Diagnosis not present

## 2021-11-17 ENCOUNTER — Ambulatory Visit: Payer: Medicare PPO | Admitting: Physician Assistant

## 2021-11-17 ENCOUNTER — Encounter: Payer: Self-pay | Admitting: Physician Assistant

## 2021-11-17 VITALS — BP 151/85 | HR 59 | Resp 18 | Ht 64.0 in | Wt 192.0 lb

## 2021-11-17 DIAGNOSIS — M5126 Other intervertebral disc displacement, lumbar region: Secondary | ICD-10-CM

## 2021-11-17 DIAGNOSIS — E781 Pure hyperglyceridemia: Secondary | ICD-10-CM | POA: Diagnosis not present

## 2021-11-17 DIAGNOSIS — R7303 Prediabetes: Secondary | ICD-10-CM | POA: Diagnosis not present

## 2021-11-17 DIAGNOSIS — E785 Hyperlipidemia, unspecified: Secondary | ICD-10-CM

## 2021-11-17 DIAGNOSIS — R7989 Other specified abnormal findings of blood chemistry: Secondary | ICD-10-CM

## 2021-11-17 DIAGNOSIS — M5416 Radiculopathy, lumbar region: Secondary | ICD-10-CM

## 2021-11-17 MED ORDER — PREGABALIN 75 MG PO CAPS: 75.0000 mg | ORAL_CAPSULE | Freq: Two times a day (BID) | ORAL | 1 refills | Status: DC

## 2021-11-17 MED ORDER — ROSUVASTATIN CALCIUM 5 MG PO TABS: 5.0000 mg | ORAL_TABLET | Freq: Every day | ORAL | 1 refills | Status: DC

## 2021-11-17 NOTE — Assessment & Plan Note (Signed)
-  Last lipid panel normal and improved from prior. Continue rosuvastatin 5 mg daily. Will repeat fasting lipid panel and hepatic function with lab visit next week.

## 2021-11-17 NOTE — Assessment & Plan Note (Signed)
-  Last A1c 6.1, will repeat A1c with lab visit next week. Pt is on oral corticosteroid therapy. Recommend to follow a low carbohydrate/glucose diet.

## 2021-11-17 NOTE — Assessment & Plan Note (Signed)
-  Overall stable with medication therapy. Continue Lyrica 75 BID.

## 2021-11-17 NOTE — Assessment & Plan Note (Signed)
-  Stable. Continue Lyrica 75 mg BID.

## 2021-11-17 NOTE — Assessment & Plan Note (Signed)
-  Asymptomatic. Last TSH 4.920. Patient is on steroid taper and will complete Saturday. Discussed corticosteroids can alter thyroid levels so will be take into consideration when repeating thyroid labs next week.

## 2021-11-17 NOTE — Progress Notes (Signed)
Established patient visit   Patient: Paula Conway   DOB: Jul 26, 1955   66 y.o. Female  MRN: [id number] Visit Date: 11/17/2021  Chief Complaint  Patient presents with   Follow-up    Non-Fasting   Hyperlipidemia   Subjective    HPI HPI     Follow-up    Additional comments: Non-Fasting      Last edited by Gemma Payor, CMA on 11/17/2021  9:06 AM.      Patient presents for chronic follow-up visit.  Prediabetes: No increased thirst or urination. Patient is currently on steroid taper.   HLD: Pt taking medication as directed without issues. No myalgias. No major changes with diet.  Spinal stenosis: Takes Lyrica 75 mg to help with radiculopathy symptoms.   Thyroid: Denies prior history of thyroid issues. No hoarseness of unintentional weight changes. Patient is currently on steroid taper for eye infection and inflammation.   Medications: Outpatient Medications Prior to Visit  Medication Sig   Ascorbic Acid (VITAMIN C) 1000 MG tablet Take 1,000 mg by mouth daily.   cephALEXin (KEFLEX) 500 MG capsule Take 500 mg by mouth 2 (two) times daily.   Cholecalciferol (VITAMIN D3) 5000 units CAPS Take 5,000 Units by mouth daily.   loratadine (CLARITIN) 10 MG tablet Take 10 mg by mouth daily.   methylPREDNISolone (MEDROL DOSEPAK) 4 MG TBPK tablet Take 4 mg by mouth 2 (two) times daily as needed.   Multiple Vitamin (MULTIVITAMIN) tablet Take 1 tablet by mouth daily.   neomycin-polymyxin b-dexamethasone (MAXITROL) 3.5-10000-0.1 OINT Place 1 Application into both eyes at bedtime.   Probiotic Product (PROBIOTIC PO) Take 1 tablet by mouth daily.   Turmeric (QC TUMERIC COMPLEX PO) Take 1 tablet by mouth once.   valACYclovir (VALTREX) 1000 MG tablet Take 1,000 mg by mouth 3 (three) times daily.   [DISCONTINUED] pregabalin (LYRICA) 75 MG capsule Take 1 capsule (75 mg total) by mouth 2 (two) times daily.   [DISCONTINUED] rosuvastatin (CRESTOR) 5 MG tablet TAKE 1 TABLET BY MOUTH AT BEDTIME.    No facility-administered medications prior to visit.    Review of Systems Review of Systems:  A fourteen system review of systems was performed and found to be positive as per HPI.  Last CBC Lab Results  Component Value Date   WBC 5.7 05/10/2021   HGB 13.7 05/10/2021   HCT 42.2 05/10/2021   MCV 92 05/10/2021   MCH 29.9 05/10/2021   RDW 12.7 05/10/2021   PLT 227 62/83/1517   Last metabolic panel Lab Results  Component Value Date   GLUCOSE 85 06/28/2021   NA 140 06/28/2021   K 4.0 06/28/2021   CL 104 06/28/2021   CO2 21 06/28/2021   BUN 16 06/28/2021   CREATININE 0.81 06/28/2021   EGFR 80 06/28/2021   CALCIUM 9.5 06/28/2021   PROT 6.5 06/28/2021   ALBUMIN 4.5 06/28/2021   LABGLOB 2.0 06/28/2021   AGRATIO 2.3 (H) 06/28/2021   BILITOT 0.4 06/28/2021   ALKPHOS 78 06/28/2021   AST 16 06/28/2021   ALT 14 06/28/2021   ANIONGAP 16 (H) 07/10/2019   Last lipids Lab Results  Component Value Date   CHOL 161 06/28/2021   HDL 54 06/28/2021   LDLCALC 82 06/28/2021   TRIG 142 06/28/2021   CHOLHDL 3.0 06/28/2021   Last hemoglobin A1c Lab Results  Component Value Date   HGBA1C 6.1 (H) 06/28/2021   Last thyroid functions Lab Results  Component Value Date   TSH 4.920 (H) 06/28/2021  Last vitamin D Lab Results  Component Value Date   VD25OH 74.8 05/10/2021       Objective    BP (!) 151/85 (BP Location: Left Arm, Patient Position: Sitting, Cuff Size: Large)   Pulse (!) 59   Resp 18   Ht _0  (1.626 m)   Wt 192 lb (87.1 kg)   SpO2 100%   BMI 32.96 kg/m  BP Readings from Last 3 Encounters:  11/17/21 (!) 151/85  05/17/21 133/66  12/16/20 117/66   Wt Readings from Last 3 Encounters:  11/17/21 192 lb (87.1 kg)  05/17/21 189 lb (85.7 kg)  05/11/21 189 lb (85.7 kg)    Physical Exam  General:  Pleasant and cooperative, appropriate for stated age.  Neuro:  Alert and oriented,  extra-ocular muscles intact  HEENT:  Normocephalic, atraumatic, neck supple   Skin:  no gross rash, warm, pink. Cardiac:  RRR, S1 S2 Respiratory: CTA B/L  Vascular:  Ext warm, no cyanosis apprec.; cap RF less 2 sec. Psych:  No HI/SI, judgement and insight good, Euthymic mood. Full Affect.   No results found for any visits on 11/17/21.  Assessment & Plan      Problem List Items Addressed This Visit       Nervous and Auditory   Radiculopathy, lumbar region    -Overall stable with medication therapy. Continue Lyrica 75 BID.      Relevant Medications   pregabalin (LYRICA) 75 MG capsule     Musculoskeletal and Integument   Lumbar discogenic pain syndrome    -Stable. Continue Lyrica 75 mg BID.      Relevant Medications   methylPREDNISolone (MEDROL DOSEPAK) 4 MG TBPK tablet   pregabalin (LYRICA) 75 MG capsule     Other   Hyperlipidemia (Chronic)    -Last lipid panel normal and improved from prior. Continue rosuvastatin 5 mg daily. Will repeat fasting lipid panel and hepatic function with lab visit next week.      Relevant Medications   rosuvastatin (CRESTOR) 5 MG tablet   Other Relevant Orders   Lipid panel   CBC with Differential/Platelet   Comprehensive metabolic panel   Prediabetes - Primary (Chronic)    -Last A1c 6.1, will repeat A1c with lab visit next week. Pt is on oral corticosteroid therapy. Recommend to follow a low carbohydrate/glucose diet.      Relevant Orders   Hemoglobin A1c   CBC with Differential/Platelet   Comprehensive metabolic panel   Elevated TSH    -Asymptomatic. Last TSH 4.920. Patient is on steroid taper and will complete Saturday. Discussed corticosteroids can alter thyroid levels so will be take into consideration when repeating thyroid labs next week.       Relevant Orders   TSH   T4, free   T3   Hypertriglyceridemia   Relevant Medications   rosuvastatin (CRESTOR) 5 MG tablet   Other Relevant Orders   Lipid panel      Return in about 6 months (around 05/18/2022) for [facility]; lab visit next Wednesday for FBW.         Lorrene Reid, PA-C  [facility] at [facility] [phone] (phone) [phone] (fax)  [place]

## 2021-11-19 DIAGNOSIS — H10023 Other mucopurulent conjunctivitis, bilateral: Secondary | ICD-10-CM | POA: Diagnosis not present

## 2021-11-24 ENCOUNTER — Other Ambulatory Visit: Payer: Medicare PPO

## 2021-11-24 DIAGNOSIS — R7303 Prediabetes: Secondary | ICD-10-CM

## 2021-11-24 DIAGNOSIS — E781 Pure hyperglyceridemia: Secondary | ICD-10-CM | POA: Diagnosis not present

## 2021-11-24 DIAGNOSIS — E785 Hyperlipidemia, unspecified: Secondary | ICD-10-CM

## 2021-11-24 DIAGNOSIS — R7989 Other specified abnormal findings of blood chemistry: Secondary | ICD-10-CM

## 2021-11-25 LAB — COMPREHENSIVE METABOLIC PANEL
ALT: 14 IU/L (ref 0–32)
AST: 15 IU/L (ref 0–40)
Albumin/Globulin Ratio: 1.8 (ref 1.2–2.2)
Albumin: 4.2 g/dL (ref 3.9–4.9)
Alkaline Phosphatase: 75 IU/L (ref 44–121)
BUN/Creatinine Ratio: 18 (ref 12–28)
BUN: 14 mg/dL (ref 8–27)
Bilirubin Total: 0.4 mg/dL (ref 0.0–1.2)
CO2: 25 mmol/L (ref 20–29)
Calcium: 9.2 mg/dL (ref 8.7–10.3)
Chloride: 103 mmol/L (ref 96–106)
Creatinine, Ser: 0.78 mg/dL (ref 0.57–1.00)
Globulin, Total: 2.3 g/dL (ref 1.5–4.5)
Glucose: 94 mg/dL (ref 70–99)
Potassium: 4.9 mmol/L (ref 3.5–5.2)
Sodium: 139 mmol/L (ref 134–144)
Total Protein: 6.5 g/dL (ref 6.0–8.5)
eGFR: 84 mL/min/{1.73_m2} (ref >59)

## 2021-11-25 LAB — CBC WITH DIFFERENTIAL/PLATELET
Basophils Absolute: 0 10*3/uL (ref 0.0–0.2)
Basos: 1 %
EOS (ABSOLUTE): 0.2 10*3/uL (ref 0.0–0.4)
Eos: 3 %
Hematocrit: 39 % (ref 34.0–46.6)
Hemoglobin: 12.9 g/dL (ref 11.1–15.9)
Immature Grans (Abs): 0 10*3/uL (ref 0.0–0.1)
Immature Granulocytes: 1 %
Lymphocytes Absolute: 2.2 10*3/uL (ref 0.7–3.1)
Lymphs: 41 %
MCH: 29.9 pg (ref 26.6–33.0)
MCHC: 33.1 g/dL (ref 31.5–35.7)
MCV: 91 fL (ref 79–97)
Monocytes Absolute: 0.6 10*3/uL (ref 0.1–0.9)
Monocytes: 12 %
Neutrophils Absolute: 2.2 10*3/uL (ref 1.4–7.0)
Neutrophils: 42 %
Platelets: 218 10*3/uL (ref 150–450)
RBC: 4.31 x10E6/uL (ref 3.77–5.28)
RDW: 12.8 % (ref 11.7–15.4)
WBC: 5.2 10*3/uL (ref 3.4–10.8)

## 2021-11-25 LAB — T3: T3, Total: 105 ng/dL (ref 71–180)

## 2021-11-25 LAB — T4, FREE: Free T4: 1.23 ng/dL (ref 0.82–1.77)

## 2021-11-25 LAB — LIPID PANEL
Chol/HDL Ratio: 2.3 ratio (ref 0.0–4.4)
Cholesterol, Total: 149 mg/dL (ref 100–199)
HDL: 64 mg/dL (ref >39)
LDL Chol Calc (NIH): 64 mg/dL (ref 0–99)
Triglycerides: 116 mg/dL (ref 0–149)
VLDL Cholesterol Cal: 21 mg/dL (ref 5–40)

## 2021-11-25 LAB — HEMOGLOBIN A1C
Est. average glucose Bld gHb Est-mCnc: 131 mg/dL
Hgb A1c MFr Bld: 6.2 % — ABNORMAL HIGH (ref 4.8–5.6)

## 2021-11-25 LAB — TSH: TSH: 3.8 u[IU]/mL (ref 0.450–4.500)

## 2021-11-26 DIAGNOSIS — H04123 Dry eye syndrome of bilateral lacrimal glands: Secondary | ICD-10-CM | POA: Diagnosis not present

## 2021-11-29 ENCOUNTER — Ambulatory Visit: Payer: Medicare PPO | Admitting: Physician Assistant

## 2021-12-16 ENCOUNTER — Ambulatory Visit: Payer: Medicare PPO | Admitting: Nurse Practitioner

## 2021-12-16 ENCOUNTER — Telehealth: Payer: Self-pay

## 2021-12-16 ENCOUNTER — Other Ambulatory Visit: Payer: Self-pay | Admitting: Nurse Practitioner

## 2021-12-16 DIAGNOSIS — R300 Dysuria: Secondary | ICD-10-CM | POA: Diagnosis not present

## 2021-12-16 DIAGNOSIS — N390 Urinary tract infection, site not specified: Secondary | ICD-10-CM

## 2021-12-16 LAB — POCT URINALYSIS DIPSTICK
Bilirubin, UA: NEGATIVE
Glucose, UA: NEGATIVE
Ketones, UA: NEGATIVE
Nitrite, UA: NEGATIVE
Protein, UA: POSITIVE — AB
Spec Grav, UA: 1.02 (ref 1.010–1.025)
Urobilinogen, UA: 0.2 E.U./dL
pH, UA: 7 (ref 5.0–8.0)

## 2021-12-16 MED ORDER — SULFAMETHOXAZOLE-TRIMETHOPRIM 800-160 MG PO TABS: 1.0000 | ORAL_TABLET | Freq: Two times a day (BID) | ORAL | 0 refills | Status: DC

## 2021-12-16 NOTE — Telephone Encounter (Signed)
Patient was notified of treatment recommendations. Patient verbalized understanding. All questions and concerns have been addressed.

## 2021-12-16 NOTE — Telephone Encounter (Signed)
Please let her know I sent bactrim DS twice daily for 7 days. She should take probiotic with that to keep good bacteria plentiful. I recommend OTC Azo for bladder pain and plenty of water  Thanks so much.   -HB

## 2021-12-16 NOTE — Progress Notes (Addendum)
Urine sample positive for large WBC, small blood, and positive for protein.   Patient started on bactrim DS and urine sent for culture and sensitivity. Will adjust medication as indicated

## 2021-12-16 NOTE — Telephone Encounter (Signed)
Patient was notified that POC UA did show signs of infection and that an antibiotic would be sent to pharmacy. Also, her urine will be sent out for culture.

## 2021-12-19 LAB — URINE CULTURE

## 2021-12-20 NOTE — Progress Notes (Signed)
Bactrim DS effective antibiotic per culture and sensitivity report.

## 2022-04-13 ENCOUNTER — Ambulatory Visit: Payer: Medicare PPO | Admitting: Orthopaedic Surgery

## 2022-04-13 ENCOUNTER — Other Ambulatory Visit (INDEPENDENT_AMBULATORY_CARE_PROVIDER_SITE_OTHER): Payer: Medicare PPO

## 2022-04-13 VITALS — Ht 64.75 in | Wt 195.8 lb

## 2022-04-13 DIAGNOSIS — M1712 Unilateral primary osteoarthritis, left knee: Secondary | ICD-10-CM

## 2022-04-13 DIAGNOSIS — M1711 Unilateral primary osteoarthritis, right knee: Secondary | ICD-10-CM

## 2022-04-13 DIAGNOSIS — M170 Bilateral primary osteoarthritis of knee: Secondary | ICD-10-CM | POA: Diagnosis not present

## 2022-04-13 NOTE — Progress Notes (Signed)
Office Visit Note   Patient: Paula Conway           Date of Birth: 1955/08/17           MRN: [id number] Visit Date: 04/13/2022              Requested by: No referring provider defined for this encounter. PCP: Lorrene Reid, PA-C (Inactive)   Assessment & Plan: Visit Diagnoses:  1. Primary osteoarthritis of right knee   2. Primary osteoarthritis of left knee     Plan:  Impression is severe bilateral knee degenerative joint disease secondary to Osteoarthritis.  Bone on bone joint space narrowing is seen on radiographs with mild varus alignment.  At this point, conservative treatments fail to provide any significant relief and the pain is severely affecting ADLs and quality of life.  Based on treatment options, the patient has elected to move forward with a knee replacement.  We have discussed the surgical risks that include but are not limited to infection, DVT, leg length discrepancy, stiffness, numbness, tingling, incomplete relief of pain.  Recovery and prognosis were also reviewed.    Anticoagulants: No antithrombotic Postop anticoagulation: Aspirin 81 mg Diabetic: No  Nickel allergy: No Prior DVT/PE: No Tobacco use: No Clearances needed for surgery: Lorrene Reid PA-C Anticipated discharge dispo: Home (likely with one of her children vs best friend)   Follow-Up Instructions: Return for post-op.   Orders:  Orders Placed This Encounter  Procedures   XR KNEE 3 VIEW RIGHT   XR KNEE 3 VIEW LEFT   No orders of the defined types were placed in this encounter.     Procedures: No procedures performed   Clinical Data: No additional findings.   Subjective: Chief Complaint  Patient presents with   Right Knee - Pain   Left Knee - Pain    HPI patient is a pleasant 67 year old female who comes in today with bilateral knee pain both equally as bad.  History of underlying osteoarthritis.  The pain she has is constant worse with walking, first few steps from  standing as well as stair climbing.  She does get mild relief with rest.  She has been taking Tylenol, NSAIDs using essential oils without significant relief.  Previous cortisone injection and viscosupplementation injections have provided only temporary relief.  Review of Systems as detailed in HPI.  All others reviewed and are negative.   Objective: Vital Signs: Ht 5' 4.75" (1.645 m)   Wt 195 lb 12.8 oz (88.8 kg)   BMI 32.83 kg/m   Physical Exam well-developed well-nourished female no acute distress.  Alert and oriented x 3.  Ortho Exam bilateral knees: Small effusion both sides.  Medial joint line tenderness on the right.  Range of motion 0 to 115 degrees.  She is neurovascularly intact distally.  Specialty Comments:  No specialty comments available.  Imaging: XR KNEE 3 VIEW LEFT  Result Date: 04/13/2022 X-rays demonstrate advanced degenerative changes medial and patellofemoral compartments  XR KNEE 3 VIEW RIGHT  Result Date: 04/13/2022 X-rays demonstrate advanced medial and patellofemoral compartment degenerative changes.  Varus deformity.      PMFS History: Patient Active Problem List   Diagnosis Date Noted   Bilateral primary osteoarthritis of knee 08/12/2021   Acute medial meniscus tear of left knee 09/04/2020   Chronic pain of right knee 08/05/2020   Statin declined 11/16/2018   Hypertriglyceridemia 11/16/2018   Grief reaction- loss of husband [name] Aug 8th, 2020 10/02/2018   Reaction, situational, acute,  to stress 10/02/2018   Dysfunction of both eustachian tubes 07/24/2017   BPPV (benign paroxysmal positional vertigo), unspecified laterality 07/24/2017   Vitamin D insufficiency 03/15/2017   Lumbar discogenic pain syndrome 02/15/2017   Radiculopathy, lumbar region 02/15/2017   Arthritis of facet joints at multiple vertebral levels 02/15/2017   Low back pain 02/15/2017   Glucose intolerance (impaired glucose tolerance) 09/14/2016   Obesity, Class I, BMI 30-34.9  08/30/2016   Health education/counseling 08/30/2016   Prediabetes 07/30/2015   Elevated LDL cholesterol level 07/30/2015   Elevated TSH 07/30/2015   Family history of breast cancer in Mother 07/20/2015   GERD (gastroesophageal reflux disease) 07/20/2015   Generalized OA 07/20/2015   Hyperlipidemia 07/20/2015   Environmental and seasonal allergies 07/20/2015   Spinal stenosis: Chronic back pain  07/20/2015   Encounter for screening mammogram for breast cancer 07/16/2015   Neoplasm of connective tissue 11/23/2011   Past Medical History:  Diagnosis Date   Allergy    Arthritis    Environmental and seasonal allergies 07/20/2015   Family history of breast cancer in Mother 07/20/2015   Mother was diagnosed at 58 years old and died age 13 of breast cancer    GERD (gastroesophageal reflux disease)    Hyperlipidemia    Obesity 07/20/2015   Spinal stenosis     Family History  Problem Relation Age of Onset   Breast cancer Mother    Diabetes Father    Hypertension Father    Heart disease Father     Past Surgical History:  Procedure Laterality Date   APPENDECTOMY     BREAST BIOPSY Right    core biopsy     Social History   Occupational History   Not on file  Tobacco Use   Smoking status: Former    Packs/day: 0.25    Years: 3.00    Additional pack years: 0.00    Total pack years: 0.75    Types: Cigarettes    Quit date: 01/11/1975    Years since quitting: 47.2   Smokeless tobacco: Never  Vaping Use   Vaping Use: Never used  Substance and Sexual Activity   Alcohol use: Yes    Comment: socially   Drug use: No   Sexual activity: Yes

## 2022-04-25 ENCOUNTER — Encounter: Payer: Self-pay | Admitting: Orthopaedic Surgery

## 2022-05-10 ENCOUNTER — Telehealth: Payer: Self-pay | Admitting: *Deleted

## 2022-05-10 NOTE — Telephone Encounter (Signed)
LVM for pt to call back to see about switching her AWV appointment to be done by phone with the AWV team on a Tues/THurs.

## 2022-05-18 ENCOUNTER — Encounter: Payer: Medicare PPO | Admitting: Family Medicine

## 2022-05-24 ENCOUNTER — Ambulatory Visit (INDEPENDENT_AMBULATORY_CARE_PROVIDER_SITE_OTHER): Payer: Medicare PPO

## 2022-05-24 VITALS — Ht 64.75 in | Wt 195.0 lb

## 2022-05-24 DIAGNOSIS — Z13820 Encounter for screening for osteoporosis: Secondary | ICD-10-CM

## 2022-05-24 DIAGNOSIS — Z0000 Encounter for general adult medical examination without abnormal findings: Secondary | ICD-10-CM | POA: Diagnosis not present

## 2022-05-24 NOTE — Patient Instructions (Addendum)
Paula Conway , Thank you for taking time to come for your Medicare Wellness Visit. I appreciate your ongoing commitment to your health goals. Please review the following plan we discussed and let me know if I can assist you in the future.   These are the goals we discussed:  Goals       Stay Healthy (pt-stated)        This is a list of the screening recommended for you and due dates:  Health Maintenance  Topic Date Due   DEXA scan (bone density measurement)  Never done   DTaP/Tdap/Td vaccine (2 - Td or Tdap) 10/27/2021   COVID-19 Vaccine (4 - 2023-24 season) 06/09/2022*   Zoster (Shingles) Vaccine (2 of 2) 08/24/2022*   Pneumonia Vaccine (1 of 1 - PCV) 05/24/2023*   Colon Cancer Screening  05/24/2023*   Flu Shot  08/11/2022   Medicare Annual Wellness Visit  05/24/2023   Mammogram  06/12/2023   Hepatitis C Screening: USPSTF Recommendation to screen - Ages 18-79 yo.  Completed   HPV Vaccine  Aged Out  *Topic was postponed. The date shown is not the original due date.    Advanced directives: Advance directive discussed with you today. Even though you declined this today, please call our office should you change your mind, and we can give you the proper paperwork for you to fill out.   Conditions/risks identified: None  Next appointment: Follow up in one year for your annual wellness visit    Preventive Care 65 Years and Older, Female Preventive care refers to lifestyle choices and visits with your health care provider that can promote health and wellness. What does preventive care include? A yearly physical exam. This is also called an annual well check. Dental exams once or twice a year. Routine eye exams. Ask your health care provider how often you should have your eyes checked. Personal lifestyle choices, including: Daily care of your teeth and gums. Regular physical activity. Eating a healthy diet. Avoiding tobacco and drug use. Limiting alcohol use. Practicing safe  sex. Taking low-dose aspirin every day. Taking vitamin and mineral supplements as recommended by your health care provider. What happens during an annual well check? The services and screenings done by your health care provider during your annual well check will depend on your age, overall health, lifestyle risk factors, and family history of disease. Counseling  Your health care provider may ask you questions about your: Alcohol use. Tobacco use. Drug use. Emotional well-being. Home and relationship well-being. Sexual activity. Eating habits. History of falls. Memory and ability to understand (cognition). Work and work Astronomer. Reproductive health. Screening  You may have the following tests or measurements: Height, weight, and BMI. Blood pressure. Lipid and cholesterol levels. These may be checked every 5 years, or more frequently if you are over 24 years old. Skin check. Lung cancer screening. You may have this screening every year starting at age 40 if you have a 30-pack-year history of smoking and currently smoke or have quit within the past 15 years. Fecal occult blood test (FOBT) of the stool. You may have this test every year starting at age 5. Flexible sigmoidoscopy or colonoscopy. You may have a sigmoidoscopy every 5 years or a colonoscopy every 10 years starting at age 77. Hepatitis C blood test. Hepatitis B blood test. Sexually transmitted disease (STD) testing. Diabetes screening. This is done by checking your blood sugar (glucose) after you have not eaten for a while (fasting). You may have  this done every 1-3 years. Bone density scan. This is done to screen for osteoporosis. You may have this done starting at age 51. Mammogram. This may be done every 1-2 years. Talk to your health care provider about how often you should have regular mammograms. Talk with your health care provider about your test results, treatment options, and if necessary, the need for more  tests. Vaccines  Your health care provider may recommend certain vaccines, such as: Influenza vaccine. This is recommended every year. Tetanus, diphtheria, and acellular pertussis (Tdap, Td) vaccine. You may need a Td booster every 10 years. Zoster vaccine. You may need this after age 71. Pneumococcal 13-valent conjugate (PCV13) vaccine. One dose is recommended after age 47. Pneumococcal polysaccharide (PPSV23) vaccine. One dose is recommended after age 43. Talk to your health care provider about which screenings and vaccines you need and how often you need them. This information is not intended to replace advice given to you by your health care provider. Make sure you discuss any questions you have with your health care provider. Document Released: 01/23/2015 Document Revised: 09/16/2015 Document Reviewed: 10/28/2014 Elsevier Interactive Patient Education  2017 [place] Prevention in the Home Falls can cause injuries. They can happen to people of all ages. There are many things you can do to make your home safe and to help prevent falls. What can I do on the outside of my home? Regularly fix the edges of walkways and driveways and fix any cracks. Remove anything that might make you trip as you walk through a door, such as a raised step or threshold. Trim any bushes or trees on the path to your home. Use bright outdoor lighting. Clear any walking paths of anything that might make someone trip, such as rocks or tools. Regularly check to see if handrails are loose or broken. Make sure that both sides of any steps have handrails. Any raised decks and porches should have guardrails on the edges. Have any leaves, snow, or ice cleared regularly. Use sand or salt on walking paths during winter. Clean up any spills in your garage right away. This includes oil or grease spills. What can I do in the bathroom? Use night lights. Install grab bars by the toilet and in the tub and shower.  Do not use towel bars as grab bars. Use non-skid mats or decals in the tub or shower. If you need to sit down in the shower, use a plastic, non-slip stool. Keep the floor dry. Clean up any water that spills on the floor as soon as it happens. Remove soap buildup in the tub or shower regularly. Attach bath mats securely with double-sided non-slip rug tape. Do not have throw rugs and other things on the floor that can make you trip. What can I do in the bedroom? Use night lights. Make sure that you have a light by your bed that is easy to reach. Do not use any sheets or blankets that are too big for your bed. They should not hang down onto the floor. Have a firm chair that has side arms. You can use this for support while you get dressed. Do not have throw rugs and other things on the floor that can make you trip. What can I do in the kitchen? Clean up any spills right away. Avoid walking on wet floors. Keep items that you use a lot in easy-to-reach places. If you need to reach something above you, use a strong step stool  that has a grab bar. Keep electrical cords out of the way. Do not use floor polish or wax that makes floors slippery. If you must use wax, use non-skid floor wax. Do not have throw rugs and other things on the floor that can make you trip. What can I do with my stairs? Do not leave any items on the stairs. Make sure that there are handrails on both sides of the stairs and use them. Fix handrails that are broken or loose. Make sure that handrails are as long as the stairways. Check any carpeting to make sure that it is firmly attached to the stairs. Fix any carpet that is loose or worn. Avoid having throw rugs at the top or bottom of the stairs. If you do have throw rugs, attach them to the floor with carpet tape. Make sure that you have a light switch at the top of the stairs and the bottom of the stairs. If you do not have them, ask someone to add them for you. What else  can I do to help prevent falls? Wear shoes that: Do not have high heels. Have rubber bottoms. Are comfortable and fit you well. Are closed at the toe. Do not wear sandals. If you use a stepladder: Make sure that it is fully opened. Do not climb a closed stepladder. Make sure that both sides of the stepladder are locked into place. Ask someone to hold it for you, if possible. Clearly mark and make sure that you can see: Any grab bars or handrails. First and last steps. Where the edge of each step is. Use tools that help you move around (mobility aids) if they are needed. These include: Canes. Walkers. Scooters. Crutches. Turn on the lights when you go into a dark area. Replace any light bulbs as soon as they burn out. Set up your furniture so you have a clear path. Avoid moving your furniture around. If any of your floors are uneven, fix them. If there are any pets around you, be aware of where they are. Review your medicines with your doctor. Some medicines can make you feel dizzy. This can increase your chance of falling. Ask your doctor what other things that you can do to help prevent falls. This information is not intended to replace advice given to you by your health care provider. Make sure you discuss any questions you have with your health care provider. Document Released: 10/23/2008 Document Revised: 06/04/2015 Document Reviewed: 01/31/2014 Elsevier Interactive Patient Education  2017 [employer].

## 2022-05-24 NOTE — Progress Notes (Signed)
Subjective:   Paula Conway is a 67 y.o. female who presents for Medicare Annual (Subsequent) preventive examination.  Review of Systems   Virtual Visit via Telephone Note  I connected with  NAYDA CARLING on 05/24/22 at  9:00 AM EDT by telephone and verified that I am speaking with the correct person using two identifiers.  Location: Patient: Home Provider: Office Persons participating in the virtual visit: patient/Nurse Health Advisor   I discussed the limitations, risks, security and privacy concerns of performing an evaluation and management service by telephone and the availability of in person appointments. The patient expressed understanding and agreed to proceed.  Interactive audio and video telecommunications were attempted between this nurse and patient, however failed, due to patient having technical difficulties OR patient did not have access to video capability.  We continued and completed visit with audio only.  Some vital signs may be absent or patient reported.   Tillie Rung, LPN  Cardiac Risk Factors include: advanced age (>75men, >71 women)     Objective:    Today's Vitals   05/24/22 0853  Weight: 195 lb (88.5 kg)  Height: 5' 4.75" (1.645 m)   Body mass index is 32.7 kg/m.     05/24/2022    9:04 AM 07/10/2019    9:25 AM 02/20/2017    8:00 AM 07/16/2015    3:29 PM  Advanced Directives  Does Patient Have a Medical Advance Directive? No No No No  Would patient like information on creating a medical advance directive? No - Patient declined  No - Patient declined No - patient declined information    Current Medications (verified) Outpatient Encounter Medications as of 05/24/2022  Medication Sig   Ascorbic Acid (VITAMIN C) 1000 MG tablet Take 1,000 mg by mouth daily.   Cholecalciferol (VITAMIN D3) 5000 units CAPS Take 5,000 Units by mouth daily.   loratadine (CLARITIN) 10 MG tablet Take 10 mg by mouth daily.   Multiple Vitamin (MULTIVITAMIN)  tablet Take 1 tablet by mouth daily.   pregabalin (LYRICA) 75 MG capsule Take 1 capsule (75 mg total) by mouth 2 (two) times daily.   Probiotic Product (PROBIOTIC PO) Take 1 tablet by mouth daily.   rosuvastatin (CRESTOR) 5 MG tablet Take 1 tablet (5 mg total) by mouth at bedtime.   Turmeric (QC TUMERIC COMPLEX PO) Take 1 tablet by mouth once.   [DISCONTINUED] cephALEXin (KEFLEX) 500 MG capsule Take 500 mg by mouth 2 (two) times daily.   [DISCONTINUED] methylPREDNISolone (MEDROL DOSEPAK) 4 MG TBPK tablet Take 4 mg by mouth 2 (two) times daily as needed.   [DISCONTINUED] neomycin-polymyxin b-dexamethasone (MAXITROL) 3.5-10000-0.1 OINT Place 1 Application into both eyes at bedtime.   [DISCONTINUED] sulfamethoxazole-trimethoprim (BACTRIM DS) 800-160 MG tablet Take 1 tablet by mouth 2 (two) times daily.   [DISCONTINUED] valACYclovir (VALTREX) 1000 MG tablet Take 1,000 mg by mouth 3 (three) times daily.   No facility-administered encounter medications on file as of 05/24/2022.    Allergies (verified) Patient has no known allergies.   History: Past Medical History:  Diagnosis Date   Allergy    Arthritis    Environmental and seasonal allergies 07/20/2015   Family history of breast cancer in Mother 07/20/2015   Mother was diagnosed at 32 years old and died age 20 of breast cancer    GERD (gastroesophageal reflux disease)    Hyperlipidemia    Obesity 07/20/2015   Spinal stenosis    Past Surgical History:  Procedure Laterality Date  APPENDECTOMY     BREAST BIOPSY Right    core biopsy     Family History  Problem Relation Age of Onset   Breast cancer Mother    Diabetes Father    Hypertension Father    Heart disease Father    Social History   Socioeconomic History   Marital status: Widowed    Spouse name: Not on file   Number of children: 2   Years of education: Not on file   Highest education level: Not on file  Occupational History   Not on file  Tobacco Use   Smoking status:  Former    Packs/day: 0.25    Years: 3.00    Additional pack years: 0.00    Total pack years: 0.75    Types: Cigarettes    Quit date: 01/11/1975    Years since quitting: 47.3   Smokeless tobacco: Never  Vaping Use   Vaping Use: Never used  Substance and Sexual Activity   Alcohol use: Yes    Comment: socially   Drug use: No   Sexual activity: Yes  Other Topics Concern   Not on file  Social History Narrative   Not on file   Social Determinants of Health   Financial Resource Strain: Low Risk  (05/24/2022)   Overall Financial Resource Strain (CARDIA)    Difficulty of Paying Living Expenses: Not hard at all  Food Insecurity: No Food Insecurity (05/24/2022)   Hunger Vital Sign    Worried About Running Out of Food in the Last Year: Never true    Ran Out of Food in the Last Year: Never true  Transportation Needs: No Transportation Needs (05/24/2022)   PRAPARE - Administrator, Civil Service (Medical): No    Lack of Transportation (Non-Medical): No  Physical Activity: Inactive (05/24/2022)   Exercise Vital Sign    Days of Exercise per Week: 0 days    Minutes of Exercise per Session: 0 min  Stress: No Stress Concern Present (05/24/2022)   [employer] of Occupational Health - Occupational Stress Questionnaire    Feeling of Stress : Not at all  Social Connections: Moderately Integrated (05/24/2022)   Social Connection and Isolation Panel [NHANES]    Frequency of Communication with Friends and Family: More than three times a week    Frequency of Social Gatherings with Friends and Family: More than three times a week    Attends Religious Services: More than 4 times per year    Active Member of [employer] or Organizations: Yes    Attends Banker Meetings: More than 4 times per year    Marital Status: Widowed    Tobacco Counseling Counseling given: Not Answered   Clinical Intake:  Pre-visit preparation completed: Yes  Pain : No/denies pain     BMI -  recorded: 32.7 Nutritional Status: BMI > 30  Obese Nutritional Risks: None Diabetes: No  How often do you need to have someone help you when you read instructions, pamphlets, or other written materials from your doctor or pharmacy?: 1 - Never  Diabetic?  No  Interpreter Needed?: No  Information entered by :: Theresa Mulligan LPN   Activities of Daily Living    05/24/2022    8:58 AM 05/17/2022    6:08 PM  In your present state of health, do you have any difficulty performing the following activities:  Hearing? 0 0  Vision? 0 0  Difficulty concentrating or making decisions? 0 1  Walking or climbing  stairs? 1 1  Comment Due to knee Arthritis. Followed by Orthopedic   Dressing or bathing? 0 0  Doing errands, shopping? 0 0  Preparing Food and eating ? N N  Using the Toilet? N N  In the past six months, have you accidently leaked urine? Malvin Johns  Comment Wears panty liners. Followed by Urologist   Do you have problems with loss of bowel control? N N  Managing your Medications? N N  Managing your Finances? N N  Housekeeping or managing your Housekeeping? N N    Patient Care Team: [name], PA as PCP - General (Family Medicine)  Indicate any recent Medical Services you may have received from other than [facility] providers in the past year (date may be approximate).     Assessment:   This is a routine wellness examination for Melicia.  Hearing/Vision screen Hearing Screening - Comments:: Denies hearing difficulties   Vision Screening - Comments:: Wears rx glasses and contacts- up to date with routine eye exams with  [facility]  Dietary issues and exercise activities discussed: Exercise limited by: orthopedic condition(s)   Goals Addressed               This Visit's Progress     Stay Healthy (pt-stated)         Depression Screen    05/24/2022    8:57 AM 11/17/2021    9:08 AM 05/17/2021   10:08 AM 12/16/2020    2:56 PM 08/10/2020   10:42 AM 06/16/2020    3:35 PM  04/08/2020    9:33 AM  PHQ 2/9 Scores  PHQ - 2 Score 0 0 0 0 1 0 2  PHQ- 9 Score  3 6 3 5 2 6     Fall Risk    05/24/2022    9:03 AM 05/17/2022    6:08 PM 11/17/2021    9:08 AM 05/17/2021   10:07 AM 12/16/2020    2:56 PM  Fall Risk   Falls in the past year? 0 0  1 1  Number falls in past yr: 0 0 0 0 0  Injury with Fall? 0 0 0 0 1  Risk for fall due to : No Fall Risks   History of fall(s) History of fall(s)  Follow up Falls prevention discussed   Falls evaluation completed Falls evaluation completed    FALL RISK PREVENTION PERTAINING TO THE HOME:  Any stairs in or around the home? Yes  If so, are there any without handrails? No  Home free of loose throw rugs in walkways, pet beds, electrical cords, etc? Yes  Adequate lighting in your home to reduce risk of falls? Yes   ASSISTIVE DEVICES UTILIZED TO PREVENT FALLS:  Life alert? No  Use of a cane, walker or w/c? No  Grab bars in the bathroom? No  Shower chair or bench in shower? Yes Elevated toilet seat or a handicapped toilet? No   TIMED UP AND GO:  Was the test performed? No . Audio Visit  Cognitive Function:        05/24/2022    9:04 AM 05/17/2021   10:10 AM  6CIT Screen  What Year? 0 points 0 points  What month? 0 points 0 points  What time? 0 points 0 points  Count back from 20 0 points 0 points  Months in reverse 0 points 0 points  Repeat phrase 0 points 0 points  Total Score 0 points 0 points    Immunizations Immunization  History  Administered Date(s) Administered   PFIZER(Purple Top)SARS-COV-2 Vaccination 03/28/2019, 04/18/2019, 12/10/2019   Pneumococcal-Unspecified 01/14/2021   Tdap 10/28/2011   Zoster Recombinat (Shingrix) 01/14/2021    TDAP status: Due, Education has been provided regarding the importance of this vaccine. Advised may receive this vaccine at local pharmacy or Health Dept. Aware to provide a copy of the vaccination record if obtained from local pharmacy or Health Dept. Verbalized acceptance  and understanding.  Flu Vaccine status: Up to date  Pneumococcal vaccine status: Up to date  Covid-19 vaccine status: Completed vaccines  Qualifies for Shingles Vaccine? Yes   Zostavax completed Yes   Shingrix Completed?: Yes  Screening Tests Health Maintenance  Topic Date Due   DEXA SCAN  Never done   DTaP/Tdap/Td (2 - Td or Tdap) 10/27/2021   COVID-19 Vaccine (4 - 2023-24 season) 06/09/2022 (Originally 09/10/2021)   Zoster Vaccines- Shingrix (2 of 2) 08/24/2022 (Originally 03/11/2021)   Pneumonia Vaccine 56+ Years old (1 of 1 - PCV) 05/24/2023 (Originally 01/17/2020)   COLONOSCOPY (Pts 45-47yrs Insurance coverage will need to be confirmed)  05/24/2023 (Originally 06/20/2018)   INFLUENZA VACCINE  08/11/2022   Medicare Annual Wellness (AWV)  05/24/2023   MAMMOGRAM  06/12/2023   Hepatitis C Screening  Completed   HPV VACCINES  Aged Out    Health Maintenance  Health Maintenance Due  Topic Date Due   DEXA SCAN  Never done   DTaP/Tdap/Td (2 - Td or Tdap) 10/27/2021    Colorectal cancer screening: Referral to GI placed Patient declined. Pt aware the office will call re: appt.  Mammogram status: Completed 06/11/21. Repeat every year  Bone Density status: Ordered 05/24/22. Pt provided with contact info and advised to call to schedule appt.  Lung Cancer Screening: (Low Dose CT Chest recommended if Age 26-80 years, 30 pack-year currently smoking OR have quit w/in 15years.) does not qualify.     Additional Screening:  Hepatitis C Screening: does qualify; Completed 08/31/16  Vision Screening: Recommended annual ophthalmology exams for early detection of glaucoma and other disorders of the eye. Is the patient up to date with their annual eye exam?  Yes  Who is the provider or what is the name of the office in which the patient attends annual eye exams? [facility] If pt is not established with a provider, would they like to be referred to a provider to establish care? No .   Dental  Screening: Recommended annual dental exams for proper oral hygiene  Community Resource Referral / Chronic Care Management:  CRR required this visit?  No   CCM required this visit?  No      Plan:     I have personally reviewed and noted the following in the patient's chart:   Medical and social history Use of alcohol, tobacco or illicit drugs  Current medications and supplements including opioid prescriptions. Patient is not currently taking opioid prescriptions. Functional ability and status Nutritional status Physical activity Advanced directives List of other physicians Hospitalizations, surgeries, and ER visits in previous 12 months Vitals Screenings to include cognitive, depression, and falls Referrals and appointments  In addition, I have reviewed and discussed with patient certain preventive protocols, quality metrics, and best practice recommendations. A written personalized care plan for preventive services as well as general preventive health recommendations were provided to patient.     Tillie Rung, LPN   1/61/0960   Nurse Notes: None

## 2022-05-31 ENCOUNTER — Ambulatory Visit (INDEPENDENT_AMBULATORY_CARE_PROVIDER_SITE_OTHER): Payer: Medicare PPO | Admitting: Family Medicine

## 2022-05-31 ENCOUNTER — Encounter: Payer: Self-pay | Admitting: Family Medicine

## 2022-05-31 VITALS — BP 124/79 | HR 60 | Resp 18 | Ht 64.75 in | Wt 196.0 lb

## 2022-05-31 DIAGNOSIS — M4800 Spinal stenosis, site unspecified: Secondary | ICD-10-CM

## 2022-05-31 DIAGNOSIS — M170 Bilateral primary osteoarthritis of knee: Secondary | ICD-10-CM

## 2022-05-31 DIAGNOSIS — K219 Gastro-esophageal reflux disease without esophagitis: Secondary | ICD-10-CM

## 2022-05-31 DIAGNOSIS — E785 Hyperlipidemia, unspecified: Secondary | ICD-10-CM

## 2022-05-31 DIAGNOSIS — E669 Obesity, unspecified: Secondary | ICD-10-CM | POA: Diagnosis not present

## 2022-05-31 DIAGNOSIS — R7303 Prediabetes: Secondary | ICD-10-CM

## 2022-05-31 DIAGNOSIS — E66811 Obesity, class 1: Secondary | ICD-10-CM

## 2022-05-31 MED ORDER — PREGABALIN 75 MG PO CAPS
75.0000 mg | ORAL_CAPSULE | Freq: Two times a day (BID) | ORAL | 1 refills | Status: DC
Start: 2022-05-31 — End: 2023-02-01

## 2022-05-31 MED ORDER — FAMOTIDINE 20 MG PO TABS
20.0000 mg | ORAL_TABLET | Freq: Every day | ORAL | 1 refills | Status: DC
Start: 2022-05-31 — End: 2022-11-28

## 2022-05-31 MED ORDER — ROSUVASTATIN CALCIUM 5 MG PO TABS
5.0000 mg | ORAL_TABLET | Freq: Every day | ORAL | 1 refills | Status: DC
Start: 2022-05-31 — End: 2022-11-28

## 2022-05-31 NOTE — Assessment & Plan Note (Signed)
We discussed the etiology of osteoarthritis further as well as the fact that due to its chronic nature, surgery is often an inevitable management option.  She is concerned about having surgery and the effect that it would have on her back due to her spinal stenosis.  She is agreeable to a referral to see a spine specialist who can collaborate to ensure that her recovery period after knee replacement has a minimal effect on her back pain due to spinal stenosis.  Ideally, she would like to see a spine specialist within [facility] since she has an existing care relationship with them for her knees.

## 2022-05-31 NOTE — Patient Instructions (Signed)
Continue taking omeprazole 20 mg daily each morning before breakfast. Add famotidine at bedtime nightly.

## 2022-05-31 NOTE — Assessment & Plan Note (Signed)
Last A1c 6.2, will repeat A1c today.

## 2022-05-31 NOTE — Assessment & Plan Note (Signed)
Referral to spine specialist at [facility] to collaborate with knee surgeon to ensure minimal exacerbation of spinal stenosis pain during recovery from potential knee replacement surgery.

## 2022-05-31 NOTE — Progress Notes (Signed)
Acute Office Visit  Subjective:     Patient ID: Paula Conway, female    DOB: Feb 08, 1955, 67 y.o.   MRN: [id number]  Chief Complaint  Patient presents with   Back Pain   Knee Pain    HPI Patient is in today for back and knee pain.  Patient has a history of spinal stenosis resulting in chronic back pain and lumbar radiculopathy.  She also has a history of bilateral osteoarthritis of her knees.  She currently [facility] for management of her knee osteoarthritis, he has recommended knee replacement surgery.  She is concerned about the effect that this will have on her spinal stenosis and she is interested in getting a referral to see a spine specialist. She also has GERD that has been refractory to omeprazole 20 mg daily in the morning.  She has been taking this for about 8 weeks.  She typically experiences a burning sensation in her throat at night, denies abdominal pain, nausea, vomiting, belching.  ROS Negative unless otherwise noted in HPI    Objective:    BP 124/79 (BP Location: Left Arm, Patient Position: Sitting, Cuff Size: Large)   Pulse 60   Resp 18   Ht 5' 4.75" (1.645 m)   Wt 196 lb (88.9 kg)   SpO2 100%   BMI 32.87 kg/m   Physical Exam Constitutional:      General: She is not in acute distress.    Appearance: Normal appearance.  HENT:     Head: Normocephalic and atraumatic.  Cardiovascular:     Rate and Rhythm: Normal rate and regular rhythm.     Heart sounds: No murmur heard.    No friction rub. No gallop.  Pulmonary:     Effort: Pulmonary effort is normal. No respiratory distress.     Breath sounds: No wheezing, rhonchi or rales.  Musculoskeletal:        General: No swelling. Normal range of motion.     Cervical back: Normal range of motion.     Right lower leg: No edema.     Left lower leg: No edema.  Skin:    General: Skin is warm and dry.  Neurological:     General: No focal deficit present.     Mental Status: She is  alert and oriented to person, place, and time. Mental status is at baseline.  Psychiatric:        Mood and Affect: Mood normal.        Thought Content: Thought content normal.        Judgment: Judgment normal.      Assessment & Plan:  Hyperlipidemia, unspecified hyperlipidemia type Assessment & Plan: Last lipid panel: LDL 64, HDL 64, triglycerides 116.  Continue rosuvastatin 5 mg daily.  Rechecking lipid panel and CMP for hepatic functioning today.  Will continue to monitor.  Orders: -     Lipid panel; Future -     Rosuvastatin Calcium; Take 1 tablet (5 mg total) by mouth at bedtime.  Dispense: 90 tablet; Refill: 1  Prediabetes Assessment & Plan: Last A1c 6.2, will repeat A1c today.  Orders: -     Hemoglobin A1c; Future  Obesity, Class I, BMI 30-34.9 -     CBC with Differential/Platelet; Future -     Comprehensive metabolic panel; Future -     Hemoglobin A1c; Future -     Lipid panel; Future -     TSH; Future  Gastroesophageal reflux disease,  unspecified whether esophagitis present Assessment & Plan: Continue omeprazole 20 mg daily in the morning, add famotidine 20 mg daily at bedtime.  If GERD symptoms still refractory after 8 weeks recommend referral to gastroenterology and possible endoscopy.  Orders: -     Famotidine; Take 1 tablet (20 mg total) by mouth at bedtime.  Dispense: 90 tablet; Refill: 1  Spinal stenosis, unspecified spinal region Assessment & Plan: Referral to [facility] at [facility] to collaborate with knee surgeon to ensure minimal exacerbation of spinal stenosis pain during recovery from potential knee replacement surgery.  Orders: -     Ambulatory referral to Orthopedic Surgery -     Pregabalin; Take 1 capsule (75 mg total) by mouth 2 (two) times daily.  Dispense: 180 capsule; Refill: 1  Bilateral primary osteoarthritis of knee Assessment & Plan: We discussed the etiology of osteoarthritis further as well as the fact that due to its chronic  nature, surgery is often an inevitable management option.  She is concerned about having surgery and the effect that it would have on her back due to her spinal stenosis.  She is agreeable to a referral to see a spine specialist who can collaborate to ensure that her recovery period after knee replacement has a minimal effect on her back pain due to spinal stenosis.  Ideally, she would like to see a spine specialist within [facility] since she has an existing care relationship with them for her knees.     Return in about 2 months (around 07/31/2022) for follow-up for GERD after adding famotidine.  I spent 50 minutes on the day of the encounter to include pre-visit record review, face-to-face time with the patient, and post visit ordering of tests.  I spent significant time with the patient answering questions, providing education, and discussing goals of referrals, medication management, and surgery.  [name], PA

## 2022-05-31 NOTE — Assessment & Plan Note (Signed)
Last lipid panel: LDL 64, HDL 64, triglycerides 116.  Continue rosuvastatin 5 mg daily.  Rechecking lipid panel and CMP for hepatic functioning today.  Will continue to monitor.

## 2022-05-31 NOTE — Assessment & Plan Note (Signed)
Continue omeprazole 20 mg daily in the morning, add famotidine 20 mg daily at bedtime.  If GERD symptoms still refractory after 8 weeks recommend referral to gastroenterology and possible endoscopy.

## 2022-06-01 LAB — CBC WITH DIFFERENTIAL/PLATELET
Basophils Absolute: 0.1 10*3/uL (ref 0.0–0.2)
Basos: 1 %
EOS (ABSOLUTE): 0.2 10*3/uL (ref 0.0–0.4)
Eos: 4 %
Hematocrit: 40 % (ref 34.0–46.6)
Hemoglobin: 13 g/dL (ref 11.1–15.9)
Immature Grans (Abs): 0 10*3/uL (ref 0.0–0.1)
Immature Granulocytes: 1 %
Lymphocytes Absolute: 2.3 10*3/uL (ref 0.7–3.1)
Lymphs: 39 %
MCH: 28.7 pg (ref 26.6–33.0)
MCHC: 32.5 g/dL (ref 31.5–35.7)
MCV: 88 fL (ref 79–97)
Monocytes Absolute: 0.5 10*3/uL (ref 0.1–0.9)
Monocytes: 8 %
Neutrophils Absolute: 2.8 10*3/uL (ref 1.4–7.0)
Neutrophils: 47 %
Platelets: 226 10*3/uL (ref 150–450)
RBC: 4.53 x10E6/uL (ref 3.77–5.28)
RDW: 12.7 % (ref 11.7–15.4)
WBC: 5.9 10*3/uL (ref 3.4–10.8)

## 2022-06-01 LAB — COMPREHENSIVE METABOLIC PANEL
ALT: 15 IU/L (ref 0–32)
AST: 16 IU/L (ref 0–40)
Albumin/Globulin Ratio: 1.8 (ref 1.2–2.2)
Albumin: 4.4 g/dL (ref 3.9–4.9)
Alkaline Phosphatase: 89 IU/L (ref 44–121)
BUN/Creatinine Ratio: 16 (ref 12–28)
BUN: 12 mg/dL (ref 8–27)
Bilirubin Total: 0.3 mg/dL (ref 0.0–1.2)
CO2: 21 mmol/L (ref 20–29)
Calcium: 10.4 mg/dL — ABNORMAL HIGH (ref 8.7–10.3)
Chloride: 103 mmol/L (ref 96–106)
Creatinine, Ser: 0.74 mg/dL (ref 0.57–1.00)
Globulin, Total: 2.5 g/dL (ref 1.5–4.5)
Glucose: 92 mg/dL (ref 70–99)
Potassium: 4.3 mmol/L (ref 3.5–5.2)
Sodium: 140 mmol/L (ref 134–144)
Total Protein: 6.9 g/dL (ref 6.0–8.5)
eGFR: 89 mL/min/{1.73_m2} (ref >59)

## 2022-06-01 LAB — LIPID PANEL
Chol/HDL Ratio: 2.9 ratio (ref 0.0–4.4)
Cholesterol, Total: 159 mg/dL (ref 100–199)
HDL: 54 mg/dL (ref >39)
LDL Chol Calc (NIH): 75 mg/dL (ref 0–99)
Triglycerides: 180 mg/dL — ABNORMAL HIGH (ref 0–149)
VLDL Cholesterol Cal: 30 mg/dL (ref 5–40)

## 2022-06-01 LAB — HEMOGLOBIN A1C
Est. average glucose Bld gHb Est-mCnc: 140 mg/dL
Hgb A1c MFr Bld: 6.5 % — ABNORMAL HIGH (ref 4.8–5.6)

## 2022-06-01 LAB — TSH: TSH: 4.64 u[IU]/mL — ABNORMAL HIGH (ref 0.450–4.500)

## 2022-06-10 ENCOUNTER — Encounter: Payer: Self-pay | Admitting: Orthopaedic Surgery

## 2022-06-14 NOTE — Telephone Encounter (Signed)
thanks

## 2022-06-16 ENCOUNTER — Other Ambulatory Visit: Payer: Self-pay

## 2022-06-22 ENCOUNTER — Encounter: Payer: Self-pay | Admitting: Orthopedic Surgery

## 2022-06-22 ENCOUNTER — Other Ambulatory Visit (INDEPENDENT_AMBULATORY_CARE_PROVIDER_SITE_OTHER): Payer: Medicare PPO

## 2022-06-22 ENCOUNTER — Ambulatory Visit: Payer: Medicare PPO | Admitting: Orthopedic Surgery

## 2022-06-22 VITALS — BP 126/81 | HR 66 | Ht 64.75 in | Wt 196.0 lb

## 2022-06-22 DIAGNOSIS — M545 Low back pain, unspecified: Secondary | ICD-10-CM

## 2022-06-22 NOTE — Progress Notes (Signed)
Orthopedic Spine Surgery Office Note  Assessment: Patient is a 67 y.o. female with chronic low back pain that radiates into bilateral buttock and left lateral hip. Has been previously diagnosed with spinal stenosis. Has done well with injections in the past.   Plan: -Explained that initially conservative treatment is tried as a significant number of patients may experience relief with these treatment modalities. Discussed that the conservative treatments include:  -activity modification  -physical therapy  -over the counter pain medications  -medrol dosepak  -lumbar steroid injections -Patient has tried PT, Tylenol, NSAIDs, lumbar steroid injections  -Patient's pain is currently tolerable and she was mostly coming in to see if getting her knee replaced would worsen her back pain. I told her that I do not think that having a knee replacement would make her back pain worse. I told her that if her back pain is worse after surgery, we can explore the non-operative treatments above and that I do not think her knee surgery caused it to become mores significant -Patient should return to office on an as-needed basis   Patient expressed understanding of the plan and all questions were answered to the patient's satisfaction.   ___________________________________________________________________________   History:  Patient is a 67 y.o. female who presents today for lumbar spine.  Patient has had several years of low back pain that radiates into the bilateral buttocks.  She also feels it on the lateral aspect of the left hip.  It does not radiate past the hip on either side.  Pain is more significant on the left side.  She has had pain this pain chronically but it has worsened within the last couple of weeks.  She notes pain at rest and with activity.  It is generally worse when she is upright and gets better if she flexes her lumbar spine.Has had steroid injections in the past in [place] and did get  relief with those.    Weakness: Denies Symptoms of imbalance: Yes, from her vertigo.  No new unsteadiness Paresthesias and numbness: Denies Bowel or bladder incontinence: Has functional incontinence.  No new urinary incontinence.  No bowel incontinence Saddle anesthesia: Denies  Treatments tried: PT, Tylenol, NSAIDs, lumbar steroid injections  Review of systems: Denies fevers and chills, night sweats, unexplained weight loss, history of cancer. Has had pain that wakes her at night   Past medical history: Hyperlipidemia GERD Vertigo  Allergies: NKDA  Past surgical history:  Appendectomy  Social history: Denies use of nicotine product (smoking, vaping, patches, smokeless) Alcohol use: yes, rare Denies recreational drug use   Physical Exam:  General: no acute distress, appears stated age Neurologic: alert, answering questions appropriately, following commands Respiratory: unlabored breathing on room air, symmetric chest rise Psychiatric: appropriate affect, normal cadence to speech   MSK (spine):  -Strength exam      Left  Right EHL    5/5  5/5 TA    5/5  5/5 GSC    5/5  5/5 Knee extension  5/5  5/5 Hip flexion   5/5  5/5  -Sensory exam    Sensation intact to light touch in L3-S1 nerve distributions of bilateral lower extremities  -Achilles DTR: 2/4 on the left, 2/4 on the right -Patellar tendon DTR: 2/4 on the left, 2/4 on the right  -Straight leg raise: negative bilaterally -Femoral nerve stretch test: negative bilaterally -Clonus: no beats bilaterally  -Left hip exam: no pain through range of motion, negative faber, negative stinchfield -Right hip exam: no pain through  range of motion, negative faber, negative stinchfield  Imaging: XR of the lumbar spine from 06/22/2022 was independently reviewed and interpreted, showing disc height loss at L2/3, L3/4, L4/5, L5/S1. Anterior osteophyte formation seen at L2/3 and L4/5.  No fracture or dislocation seen.  No  evidence of instability on flexion/extension views.   Patient name: Paula Conway Patient MRN: [id number] Date of visit: 06/22/22

## 2022-07-04 ENCOUNTER — Ambulatory Visit: Payer: Medicare PPO

## 2022-07-04 DIAGNOSIS — M1712 Unilateral primary osteoarthritis, left knee: Secondary | ICD-10-CM | POA: Diagnosis not present

## 2022-07-05 ENCOUNTER — Ambulatory Visit (INDEPENDENT_AMBULATORY_CARE_PROVIDER_SITE_OTHER): Payer: Medicare PPO | Admitting: Family Medicine

## 2022-07-05 ENCOUNTER — Encounter: Payer: Self-pay | Admitting: Family Medicine

## 2022-07-05 VITALS — BP 145/85 | HR 60 | Temp 97.6°F | Ht 64.0 in | Wt 198.0 lb

## 2022-07-05 DIAGNOSIS — R300 Dysuria: Secondary | ICD-10-CM | POA: Diagnosis not present

## 2022-07-05 DIAGNOSIS — R82998 Other abnormal findings in urine: Secondary | ICD-10-CM | POA: Diagnosis not present

## 2022-07-05 LAB — POCT URINALYSIS DIP (CLINITEK)
Bilirubin, UA: NEGATIVE
Glucose, UA: NEGATIVE mg/dL
Ketones, POC UA: NEGATIVE mg/dL
Nitrite, UA: NEGATIVE
POC PROTEIN,UA: NEGATIVE
Spec Grav, UA: 1.02 (ref 1.010–1.025)
Urobilinogen, UA: 0.2 E.U./dL
pH, UA: 7.5 (ref 5.0–8.0)

## 2022-07-05 LAB — HEMOGLOBIN A1C
Hgb A1c MFr Bld: 6.2 % of total Hgb — ABNORMAL HIGH (ref <5.7)
Mean Plasma Glucose: 131 mg/dL
eAG (mmol/L): 7.3 mmol/L

## 2022-07-05 LAB — PREALBUMIN: Prealbumin: 24 mg/dL (ref 17–34)

## 2022-07-05 MED ORDER — SULFAMETHOXAZOLE-TRIMETHOPRIM 800-160 MG PO TABS: 1.0000 | ORAL_TABLET | Freq: Two times a day (BID) | ORAL | 0 refills | Status: AC

## 2022-07-05 NOTE — Patient Instructions (Signed)
It was nice to see you today,  We addressed the following topics today: We prescribed bactrim twice a day for the next 3 days.   I will follow up with the culture results when I get them.  If you have any fever, abdominal pain, nausea or vomiting please go to the ED to be evaluated for pyelonephritis.   Have a great day,  Frederic Jericho, MD

## 2022-07-05 NOTE — Assessment & Plan Note (Signed)
Symptoms consistent with uti.  Urine dipstick read on machine as small LE, but more consistent with mod to large.  3 days bactrim prescribed. Urine culture sent off. Marland Kitchen

## 2022-07-05 NOTE — Progress Notes (Signed)
   Acute Office Visit  Subjective:     Patient ID: Paula Conway, female    DOB: [birth year], 67 y.o.   MRN: [id number]  Chief Complaint  Patient presents with   Acute Visit    uti    HPI Patient is in today for UTI. Pt having pain with urination, increased urinary frequency ( 8 times so far today ) and 'chills' after urination.  No fevers.  Symptoms started last night.  Has a hx of UTI's.    ROS      Objective:    BP (!) 145/85   Pulse 60   Temp 97.6 F (36.4 C) (Oral)   Ht 5\' 4"  (1.626 m)   Wt 198 lb (89.8 kg)   SpO2 98%   BMI 33.99 kg/m    Physical Exam Gen: alert, oriented Cv: rrr Pulm: lctab   Results for orders placed or performed in visit on 07/05/22  POCT URINALYSIS DIP (CLINITEK)  Result Value Ref Range   Color, UA light yellow (A) yellow   Clarity, UA clear clear   Glucose, UA negative negative mg/dL   Bilirubin, UA negative negative   Ketones, POC UA negative negative mg/dL   Spec Grav, UA 1.027 2.536 - 1.025   Blood, UA trace-intact (A) negative   pH, UA 7.5 5.0 - 8.0   POC PROTEIN,UA negative negative, trace   Urobilinogen, UA 0.2 0.2 or 1.0 E.U./dL   Nitrite, UA Negative Negative   Leukocytes, UA Small (1+) (A) Negative        Assessment & Plan:   Dysuria Assessment & Plan: Symptoms consistent with uti.  Urine dipstick read on machine as small LE, but more consistent with mod to large.  3 days bactrim prescribed. Urine culture sent off. .    Orders: -     POCT URINALYSIS DIP (CLINITEK)  Leukocytes in urine -     Urine Culture  Other orders -     Sulfamethoxazole-Trimethoprim; Take 1 tablet by mouth 2 (two) times daily for 3 days.  Dispense: 6 tablet; Refill: 0     Return if symptoms worsen or fail to improve.  Sandre Kitty, MD

## 2022-07-08 LAB — URINE CULTURE

## 2022-07-08 NOTE — Progress Notes (Signed)
Surgical Instructions    Your procedure is scheduled on Monday, 07/25/22 at 7:15 AM.  Report to [facility] Main Entrance "A" at 5:30 AM, then check in with the Admitting office.  Call this number if you have problems the morning of surgery:  [phone]   If you have any questions prior to your surgery date call [phone]: Open Monday-Friday 8am-4pm If you experience any cold or flu symptoms such as cough, fever, chills, shortness of breath, etc. between now and your scheduled surgery, please notify us at the above number    Remember:  Do not eat after midnight the night before your surgery  You may drink clear liquids until 4:30 AM the morning of your surgery.   Clear liquids allowed are: Water, Non-Citrus Juices (without pulp), Carbonated Beverages, Clear Tea, Black Coffee ONLY (NO MILK, CREAM OR POWDERED CREAMER of any kind), and Gatorade  Enhanced Recovery after Surgery for Orthopedics Enhanced Recovery after Surgery is a protocol used to improve the stress on your body and your recovery after surgery.  Patient Instructions  The day of surgery (if you do NOT have diabetes):  Drink ONE (1) Pre-Surgery Clear Ensure by 4:30 am the morning of surgery   This drink was given to you during your hospital  pre-op appointment visit. Nothing else to drink after completing the  Pre-Surgery Clear Ensure.         If you have questions, please contact your surgeon's office.     Take these medicines the morning of surgery with A SIP OF WATER: Loratadine (Claritin), Omeprazole (Prilosec), Pregabalin (Lyrica), Acetaminophen (Tylenol) - if needed   As of today, STOP taking any Aspirin (unless otherwise instructed by your surgeon) Aleve, Naproxen, Ibuprofen, Motrin, Advil, Goody's, BC's, all herbal medications, fish oil and all vitamins.     Do not wear jewelry or makeup. Do not bring valuables to the hospital. Do not wear nail polish, gel polish, artificial nails, or any other type of  covering on natural nails (fingers and toes) If you have artificial nails or gel coating that need to be removed by a nail salon, please have this removed prior to surgery. Artificial nails or gel coating may interfere with anesthesia's ability to adequately monitor your vital signs.  [facility] is not responsible for any belongings or valuables.    Contacts, glasses, hearing aids, dentures or partials may not be worn into surgery, please bring cases for these belongings   For patients admitted to the hospital, discharge time will be determined by your treatment team.   SURGICAL WAITING ROOM VISITATION Patients having surgery or a procedure may have no more than 2 support people in the waiting area - these visitors may rotate.   Children under the age of 93 must have an adult with them who is not the patient. If the patient needs to stay at the hospital during part of their recovery, the visitor guidelines for inpatient rooms apply. Pre-op nurse will coordinate an appropriate time for 1 support person to accompany patient in pre-op.  This support person may not rotate.   Please refer to [email or web address] for the visitor guidelines for Inpatients (after your surgery is over and you are in a regular room).   Special instructions:    Oral Hygiene is also important to reduce your risk of infection.  Remember - BRUSH YOUR TEETH THE MORNING OF SURGERY WITH YOUR REGULAR TOOTHPASTE   Please read the [facility] Bath Instructions that are attached. Start the baths on Thursday  night (07/21/22).  Please read over the fact sheets that you were given.

## 2022-07-11 ENCOUNTER — Encounter (HOSPITAL_COMMUNITY): Payer: Self-pay

## 2022-07-11 ENCOUNTER — Encounter (HOSPITAL_COMMUNITY)
Admission: RE | Admit: 2022-07-11 | Discharge: 2022-07-11 | Disposition: A | Discharge status: Home / Self Care | Payer: Medicare PPO | Source: Ambulatory Visit | Attending: Orthopaedic Surgery | Admitting: Orthopaedic Surgery

## 2022-07-11 ENCOUNTER — Other Ambulatory Visit: Payer: Self-pay

## 2022-07-11 DIAGNOSIS — M170 Bilateral primary osteoarthritis of knee: Secondary | ICD-10-CM | POA: Diagnosis not present

## 2022-07-11 DIAGNOSIS — Z01818 Encounter for other preprocedural examination: Secondary | ICD-10-CM | POA: Diagnosis not present

## 2022-07-11 HISTORY — DX: Family history of other specified conditions: Z84.89

## 2022-07-11 HISTORY — DX: Other complications of anesthesia, initial encounter: T88.59XA

## 2022-07-11 HISTORY — DX: Essential (primary) hypertension: I10

## 2022-07-11 HISTORY — DX: Prediabetes: R73.03

## 2022-07-11 HISTORY — DX: Headache, unspecified: R51.9

## 2022-07-11 HISTORY — DX: Personal history of urinary calculi: Z87.442

## 2022-07-11 LAB — CBC
HCT: 41.4 % (ref 36.0–46.0)
Hemoglobin: 13.2 g/dL (ref 12.0–15.0)
MCH: 28.5 pg (ref 26.0–34.0)
MCHC: 31.9 g/dL (ref 30.0–36.0)
MCV: 89.4 fL (ref 80.0–100.0)
Platelets: 235 10*3/uL (ref 150–400)
RBC: 4.63 MIL/uL (ref 3.87–5.11)
RDW: 13.8 % (ref 11.5–15.5)
WBC: 6 10*3/uL (ref 4.0–10.5)
nRBC: 0 % (ref 0.0–0.2)

## 2022-07-11 LAB — BASIC METABOLIC PANEL
Anion gap: 9 (ref 5–15)
BUN: 15 mg/dL (ref 8–23)
CO2: 24 mmol/L (ref 22–32)
Calcium: 9.4 mg/dL (ref 8.9–10.3)
Chloride: 103 mmol/L (ref 98–111)
Creatinine, Ser: 0.97 mg/dL (ref 0.44–1.00)
GFR, Estimated: >60 mL/min (ref >60)
Glucose, Bld: 104 mg/dL — ABNORMAL HIGH (ref 70–99)
Potassium: 3.7 mmol/L (ref 3.5–5.1)
Sodium: 136 mmol/L (ref 135–145)

## 2022-07-11 LAB — SURGICAL PCR SCREEN
MRSA, PCR: NEGATIVE
Staphylococcus aureus: NEGATIVE

## 2022-07-11 NOTE — Progress Notes (Addendum)
PCP - Saralyn Pilar, PA ([place], [facility])  Chest x-ray - N/A EKG - today Stress Test - no ECHO - 2014 (CE) Cardiac Cath - no  Sleep Study - No  ERAS Protcol - yes PRE-SURGERY Ensure   Anesthesia review: No  Patient denies shortness of breath, fever, cough and chest pain at PAT appointment   All instructions explained to the patient, with a verbal understanding of the material. Patient agrees to go over the instructions while at home for a better understanding. The opportunity to ask questions was provided.

## 2022-07-12 ENCOUNTER — Encounter: Payer: Self-pay | Admitting: Orthopaedic Surgery

## 2022-07-12 ENCOUNTER — Other Ambulatory Visit: Payer: Self-pay | Admitting: Family Medicine

## 2022-07-12 DIAGNOSIS — Z1231 Encounter for screening mammogram for malignant neoplasm of breast: Secondary | ICD-10-CM

## 2022-07-18 ENCOUNTER — Other Ambulatory Visit: Payer: Self-pay | Admitting: Physician Assistant

## 2022-07-18 ENCOUNTER — Encounter: Payer: Self-pay | Admitting: Orthopaedic Surgery

## 2022-07-18 MED ORDER — METHOCARBAMOL 750 MG PO TABS: 750.0000 mg | ORAL_TABLET | Freq: Two times a day (BID) | ORAL | 2 refills | Status: DC | PRN

## 2022-07-18 MED ORDER — ONDANSETRON HCL 4 MG PO TABS: 4.0000 mg | ORAL_TABLET | Freq: Three times a day (TID) | ORAL | 0 refills | Status: DC | PRN

## 2022-07-18 MED ORDER — ASPIRIN 81 MG PO TBEC: 81.0000 mg | DELAYED_RELEASE_TABLET | Freq: Two times a day (BID) | ORAL | 0 refills | Status: DC

## 2022-07-18 MED ORDER — DOCUSATE SODIUM 100 MG PO CAPS: 100.0000 mg | ORAL_CAPSULE | Freq: Every day | ORAL | 2 refills | Status: DC | PRN

## 2022-07-18 MED ORDER — OXYCODONE-ACETAMINOPHEN 5-325 MG PO TABS: 1.0000 | ORAL_TABLET | Freq: Four times a day (QID) | ORAL | 0 refills | Status: DC | PRN

## 2022-07-20 ENCOUNTER — Ambulatory Visit
Admission: RE | Admit: 2022-07-20 | Discharge: 2022-07-20 | Disposition: A | Discharge status: Home / Self Care | Payer: Medicare PPO | Source: Ambulatory Visit | Attending: Family Medicine | Admitting: Family Medicine

## 2022-07-20 DIAGNOSIS — Z1231 Encounter for screening mammogram for malignant neoplasm of breast: Secondary | ICD-10-CM | POA: Diagnosis not present

## 2022-07-22 MED ORDER — TRANEXAMIC ACID 1000 MG/10ML IV SOLN
2000.0000 mg | INTRAVENOUS | Status: AC
  Filled 2022-07-22: qty 20

## 2022-07-24 ENCOUNTER — Telehealth: Payer: Self-pay | Admitting: *Deleted

## 2022-07-24 DIAGNOSIS — M1711 Unilateral primary osteoarthritis, right knee: Secondary | ICD-10-CM | POA: Insufficient documentation

## 2022-07-24 NOTE — Care Plan (Signed)
[facility] RNCM call to patient to discuss her upcoming Right total knee arthroplasty with Dr. Roda Shutters on 07/25/22. She is an ortho bundle patient through [facility] and is agreeable to case management. She lives alone, but has a son/family that will be assisting along with friends after discharge. She has a home CPM already and also has a RW. Anticipate HHPT will be needed after a short hospital stay. Referral made to [facility] after choice provided. Reviewed post op care instructions. Will continue to follow for needs.

## 2022-07-24 NOTE — Anesthesia Preprocedure Evaluation (Addendum)
Anesthesia Evaluation  Patient identified by MRN, date of birth, ID band Patient awake    Reviewed: Allergy & Precautions, NPO status , Patient's Chart, lab work & pertinent test results  History of Anesthesia Complications (+) Family history of anesthesia reaction and history of anesthetic complications  Airway Mallampati: II  TM Distance: >3 FB Neck ROM: Full    Dental no notable dental hx.    Pulmonary former smoker   Pulmonary exam normal breath sounds clear to auscultation       Cardiovascular hypertension, Normal cardiovascular exam Rhythm:Regular Rate:Normal     Neuro/Psych  Headaches    GI/Hepatic Neg liver ROS,GERD  ,,  Endo/Other  negative endocrine ROS    Renal/GU negative Renal ROS     Musculoskeletal  (+) Arthritis ,    Abdominal  (+) + obese  Peds  Hematology negative hematology ROS (+)   Anesthesia Other Findings   Reproductive/Obstetrics                              Anesthesia Physical Anesthesia Plan  ASA: 2  Anesthesia Plan: Spinal   Post-op Pain Management: Regional block* and Tylenol PO (pre-op)*   Induction:   PONV Risk Score and Plan: 3 and Ondansetron, Dexamethasone and Treatment may vary due to age or medical condition  Airway Management Planned: Natural Airway  Additional Equipment:   Intra-op Plan:   Post-operative Plan:   Informed Consent: I have reviewed the patients History and Physical, chart, labs and discussed the procedure including the risks, benefits and alternatives for the proposed anesthesia with the patient or authorized representative who has indicated his/her understanding and acceptance.     Dental advisory given  Plan Discussed with: CRNA  Anesthesia Plan Comments:         Anesthesia Quick Evaluation

## 2022-07-24 NOTE — Telephone Encounter (Signed)
Ortho bundle pre-op call completed. 

## 2022-07-25 ENCOUNTER — Observation Stay (HOSPITAL_COMMUNITY): Payer: Medicare PPO

## 2022-07-25 ENCOUNTER — Ambulatory Visit (HOSPITAL_COMMUNITY): Payer: Medicare PPO | Admitting: Anesthesiology

## 2022-07-25 ENCOUNTER — Observation Stay (HOSPITAL_COMMUNITY)
Admission: RE | Admit: 2022-07-25 | Discharge: 2022-07-26 | Disposition: A | Discharge status: Home / Self Care | Payer: Medicare PPO | Source: Ambulatory Visit | Attending: Orthopaedic Surgery | Admitting: Orthopaedic Surgery

## 2022-07-25 ENCOUNTER — Other Ambulatory Visit: Payer: Self-pay

## 2022-07-25 ENCOUNTER — Encounter (HOSPITAL_COMMUNITY)
Admission: RE | Disposition: A | Discharge status: Home / Self Care | Payer: Self-pay | Source: Ambulatory Visit | Attending: Orthopaedic Surgery

## 2022-07-25 DIAGNOSIS — Z79899 Other long term (current) drug therapy: Secondary | ICD-10-CM | POA: Diagnosis not present

## 2022-07-25 DIAGNOSIS — Z87891 Personal history of nicotine dependence: Secondary | ICD-10-CM | POA: Insufficient documentation

## 2022-07-25 DIAGNOSIS — M1711 Unilateral primary osteoarthritis, right knee: Secondary | ICD-10-CM | POA: Diagnosis not present

## 2022-07-25 DIAGNOSIS — G8918 Other acute postprocedural pain: Secondary | ICD-10-CM | POA: Diagnosis not present

## 2022-07-25 DIAGNOSIS — Z01818 Encounter for other preprocedural examination: Secondary | ICD-10-CM

## 2022-07-25 DIAGNOSIS — I10 Essential (primary) hypertension: Secondary | ICD-10-CM | POA: Diagnosis not present

## 2022-07-25 DIAGNOSIS — Z7982 Long term (current) use of aspirin: Secondary | ICD-10-CM | POA: Diagnosis not present

## 2022-07-25 DIAGNOSIS — Z96651 Presence of right artificial knee joint: Secondary | ICD-10-CM

## 2022-07-25 DIAGNOSIS — M170 Bilateral primary osteoarthritis of knee: Principal | ICD-10-CM

## 2022-07-25 DIAGNOSIS — M85861 Other specified disorders of bone density and structure, right lower leg: Secondary | ICD-10-CM

## 2022-07-25 HISTORY — PX: TOTAL KNEE ARTHROPLASTY: SHX125

## 2022-07-25 SURGERY — ARTHROPLASTY, KNEE, TOTAL
Anesthesia: Spinal | Site: Knee | Laterality: Right

## 2022-07-25 MED ORDER — ASPIRIN 81 MG PO CHEW
81.0000 mg | CHEWABLE_TABLET | Freq: Two times a day (BID) | ORAL | Status: DC
  Administered 2022-07-25 – 2022-07-26 (×2): 81 mg via ORAL
  Filled 2022-07-25 (×2): qty 1

## 2022-07-25 MED ORDER — POVIDONE-IODINE 10 % EX SWAB
2.0000 | Freq: Once | CUTANEOUS | Status: AC
  Administered 2022-07-25: 2 via TOPICAL

## 2022-07-25 MED ORDER — HYDROMORPHONE HCL 1 MG/ML IJ SOLN: 0.2500 mg | INTRAMUSCULAR | Status: DC | PRN

## 2022-07-25 MED ORDER — ROPIVACAINE HCL 5 MG/ML IJ SOLN
INTRAMUSCULAR | Status: DC | PRN
  Administered 2022-07-25: 30 mL via PERINEURAL

## 2022-07-25 MED ORDER — METOCLOPRAMIDE HCL 5 MG/ML IJ SOLN: 5.0000 mg | Freq: Three times a day (TID) | INTRAMUSCULAR | Status: DC | PRN

## 2022-07-25 MED ORDER — FENTANYL CITRATE (PF) 250 MCG/5ML IJ SOLN
INTRAMUSCULAR | Status: AC
  Filled 2022-07-25: qty 5

## 2022-07-25 MED ORDER — CEFAZOLIN SODIUM-DEXTROSE 2-4 GM/100ML-% IV SOLN
2.0000 g | Freq: Four times a day (QID) | INTRAVENOUS | Status: AC
  Administered 2022-07-25 (×2): 2 g via INTRAVENOUS
  Filled 2022-07-25 (×2): qty 100

## 2022-07-25 MED ORDER — ACETAMINOPHEN 325 MG PO TABS: 325.0000 mg | ORAL_TABLET | Freq: Four times a day (QID) | ORAL | Status: DC | PRN

## 2022-07-25 MED ORDER — AMISULPRIDE (ANTIEMETIC) 5 MG/2ML IV SOLN: 10.0000 mg | Freq: Once | INTRAVENOUS | Status: DC | PRN

## 2022-07-25 MED ORDER — BUPIVACAINE IN DEXTROSE 0.75-8.25 % IT SOLN
INTRATHECAL | Status: DC | PRN
  Administered 2022-07-25: 1.6 mL via INTRATHECAL

## 2022-07-25 MED ORDER — PROPOFOL 10 MG/ML IV BOLUS
INTRAVENOUS | Status: AC
  Filled 2022-07-25: qty 20

## 2022-07-25 MED ORDER — OXYCODONE HCL 5 MG PO TABS
5.0000 mg | ORAL_TABLET | ORAL | Status: DC | PRN
  Administered 2022-07-25 – 2022-07-26 (×3): 5 mg via ORAL
  Filled 2022-07-25: qty 2
  Filled 2022-07-25 (×2): qty 1

## 2022-07-25 MED ORDER — DEXAMETHASONE SODIUM PHOSPHATE 10 MG/ML IJ SOLN
INTRAMUSCULAR | Status: DC | PRN
  Administered 2022-07-25: 4 mg via INTRAVENOUS

## 2022-07-25 MED ORDER — PHENOL 1.4 % MT LIQD: 1.0000 | OROMUCOSAL | Status: DC | PRN

## 2022-07-25 MED ORDER — CEFAZOLIN SODIUM-DEXTROSE 2-4 GM/100ML-% IV SOLN
2.0000 g | INTRAVENOUS | Status: AC
  Administered 2022-07-25: 2 g via INTRAVENOUS
  Filled 2022-07-25: qty 100

## 2022-07-25 MED ORDER — ONDANSETRON HCL 4 MG/2ML IJ SOLN
INTRAMUSCULAR | Status: AC
  Filled 2022-07-25: qty 2

## 2022-07-25 MED ORDER — ACETAMINOPHEN 500 MG PO TABS
1000.0000 mg | ORAL_TABLET | Freq: Four times a day (QID) | ORAL | Status: AC
  Administered 2022-07-25 – 2022-07-26 (×4): 1000 mg via ORAL
  Filled 2022-07-25 (×4): qty 2

## 2022-07-25 MED ORDER — PROPOFOL 1000 MG/100ML IV EMUL
INTRAVENOUS | Status: AC
  Filled 2022-07-25: qty 500

## 2022-07-25 MED ORDER — ONDANSETRON HCL 4 MG PO TABS: 4.0000 mg | ORAL_TABLET | Freq: Four times a day (QID) | ORAL | Status: DC | PRN

## 2022-07-25 MED ORDER — ONDANSETRON HCL 4 MG/2ML IJ SOLN
4.0000 mg | Freq: Four times a day (QID) | INTRAMUSCULAR | Status: DC | PRN
  Administered 2022-07-26: 4 mg via INTRAVENOUS
  Filled 2022-07-25: qty 2

## 2022-07-25 MED ORDER — PHENYLEPHRINE 80 MCG/ML (10ML) SYRINGE FOR IV PUSH (FOR BLOOD PRESSURE SUPPORT)
PREFILLED_SYRINGE | INTRAVENOUS | Status: AC
  Filled 2022-07-25: qty 10

## 2022-07-25 MED ORDER — BUPIVACAINE-MELOXICAM ER 400-12 MG/14ML IJ SOLN
INTRAMUSCULAR | Status: DC | PRN
  Administered 2022-07-25: 400 mg

## 2022-07-25 MED ORDER — BUPIVACAINE-MELOXICAM ER 400-12 MG/14ML IJ SOLN
INTRAMUSCULAR | Status: AC
  Filled 2022-07-25: qty 1

## 2022-07-25 MED ORDER — PROMETHAZINE HCL 25 MG/ML IJ SOLN: 6.2500 mg | INTRAMUSCULAR | Status: DC | PRN

## 2022-07-25 MED ORDER — ONDANSETRON HCL 4 MG/2ML IJ SOLN
INTRAMUSCULAR | Status: DC | PRN
  Administered 2022-07-25: 4 mg via INTRAVENOUS

## 2022-07-25 MED ORDER — 0.9 % SODIUM CHLORIDE (POUR BTL) OPTIME
TOPICAL | Status: DC | PRN
  Administered 2022-07-25: 1000 mL

## 2022-07-25 MED ORDER — TRANEXAMIC ACID-NACL 1000-0.7 MG/100ML-% IV SOLN
1000.0000 mg | INTRAVENOUS | Status: AC
  Administered 2022-07-25: 1000 mg via INTRAVENOUS
  Filled 2022-07-25: qty 100

## 2022-07-25 MED ORDER — OXYCODONE HCL 5 MG PO TABS
ORAL_TABLET | ORAL | Status: AC
  Filled 2022-07-25: qty 1

## 2022-07-25 MED ORDER — SODIUM CHLORIDE 0.9 % IV SOLN: INTRAVENOUS | Status: DC

## 2022-07-25 MED ORDER — CLONIDINE HCL (ANALGESIA) 100 MCG/ML EP SOLN
EPIDURAL | Status: DC | PRN
  Administered 2022-07-25: 80 ug

## 2022-07-25 MED ORDER — OXYCODONE HCL ER 10 MG PO T12A
10.0000 mg | EXTENDED_RELEASE_TABLET | Freq: Two times a day (BID) | ORAL | Status: DC
  Administered 2022-07-25 – 2022-07-26 (×3): 10 mg via ORAL
  Filled 2022-07-25 (×3): qty 1

## 2022-07-25 MED ORDER — DEXAMETHASONE SODIUM PHOSPHATE 10 MG/ML IJ SOLN
INTRAMUSCULAR | Status: AC
  Filled 2022-07-25: qty 1

## 2022-07-25 MED ORDER — PREGABALIN 75 MG PO CAPS
75.0000 mg | ORAL_CAPSULE | Freq: Two times a day (BID) | ORAL | Status: DC
  Administered 2022-07-25 – 2022-07-26 (×2): 75 mg via ORAL
  Filled 2022-07-25 (×2): qty 1

## 2022-07-25 MED ORDER — METOCLOPRAMIDE HCL 5 MG PO TABS: 5.0000 mg | ORAL_TABLET | Freq: Three times a day (TID) | ORAL | Status: DC | PRN

## 2022-07-25 MED ORDER — PHENYLEPHRINE HCL-NACL 20-0.9 MG/250ML-% IV SOLN
INTRAVENOUS | Status: AC
  Filled 2022-07-25: qty 750

## 2022-07-25 MED ORDER — KETOROLAC TROMETHAMINE 15 MG/ML IJ SOLN
7.5000 mg | Freq: Four times a day (QID) | INTRAMUSCULAR | Status: AC
  Administered 2022-07-25 – 2022-07-26 (×4): 7.5 mg via INTRAVENOUS
  Filled 2022-07-25 (×3): qty 1

## 2022-07-25 MED ORDER — DEXAMETHASONE SODIUM PHOSPHATE 10 MG/ML IJ SOLN: 10.0000 mg | Freq: Once | INTRAMUSCULAR | Status: DC

## 2022-07-25 MED ORDER — METHOCARBAMOL 1000 MG/10ML IJ SOLN: 500.0000 mg | Freq: Four times a day (QID) | INTRAVENOUS | Status: DC | PRN

## 2022-07-25 MED ORDER — DOCUSATE SODIUM 100 MG PO CAPS
100.0000 mg | ORAL_CAPSULE | Freq: Two times a day (BID) | ORAL | Status: DC
  Administered 2022-07-25 – 2022-07-26 (×2): 100 mg via ORAL
  Filled 2022-07-25 (×2): qty 1

## 2022-07-25 MED ORDER — LACTATED RINGERS IV SOLN: INTRAVENOUS | Status: DC

## 2022-07-25 MED ORDER — FERROUS SULFATE 325 (65 FE) MG PO TABS
325.0000 mg | ORAL_TABLET | Freq: Three times a day (TID) | ORAL | Status: DC
  Administered 2022-07-25 – 2022-07-26 (×3): 325 mg via ORAL
  Filled 2022-07-25 (×3): qty 1

## 2022-07-25 MED ORDER — HYDROMORPHONE HCL 1 MG/ML IJ SOLN: 0.5000 mg | INTRAMUSCULAR | Status: DC | PRN

## 2022-07-25 MED ORDER — SODIUM CHLORIDE 0.9 % IR SOLN
Status: DC | PRN
  Administered 2022-07-25: 3000 mL

## 2022-07-25 MED ORDER — CHLORHEXIDINE GLUCONATE 0.12 % MT SOLN
15.0000 mL | Freq: Once | OROMUCOSAL | Status: AC
  Administered 2022-07-25: 15 mL via OROMUCOSAL
  Filled 2022-07-25: qty 15

## 2022-07-25 MED ORDER — PRONTOSAN WOUND IRRIGATION OPTIME
TOPICAL | Status: DC | PRN
  Administered 2022-07-25: 1 via TOPICAL

## 2022-07-25 MED ORDER — MIDAZOLAM HCL 2 MG/2ML IJ SOLN
INTRAMUSCULAR | Status: DC | PRN
  Administered 2022-07-25 (×2): 1 mg via INTRAVENOUS

## 2022-07-25 MED ORDER — PROPOFOL 500 MG/50ML IV EMUL
INTRAVENOUS | Status: DC | PRN
  Administered 2022-07-25: 125 ug/kg/min via INTRAVENOUS

## 2022-07-25 MED ORDER — DEXAMETHASONE SODIUM PHOSPHATE 4 MG/ML IJ SOLN
INTRAMUSCULAR | Status: DC | PRN
  Administered 2022-07-25: 5 mg via PERINEURAL

## 2022-07-25 MED ORDER — MEPERIDINE HCL 25 MG/ML IJ SOLN: 6.2500 mg | INTRAMUSCULAR | Status: DC | PRN

## 2022-07-25 MED ORDER — TRANEXAMIC ACID-NACL 1000-0.7 MG/100ML-% IV SOLN
1000.0000 mg | Freq: Once | INTRAVENOUS | Status: AC
  Administered 2022-07-25: 1000 mg via INTRAVENOUS
  Filled 2022-07-25: qty 100

## 2022-07-25 MED ORDER — METHOCARBAMOL 500 MG PO TABS
ORAL_TABLET | ORAL | Status: AC
  Filled 2022-07-25: qty 1

## 2022-07-25 MED ORDER — VANCOMYCIN HCL 1000 MG IV SOLR
INTRAVENOUS | Status: AC
  Filled 2022-07-25: qty 20

## 2022-07-25 MED ORDER — KETOROLAC TROMETHAMINE 15 MG/ML IJ SOLN
INTRAMUSCULAR | Status: AC
  Filled 2022-07-25: qty 1

## 2022-07-25 MED ORDER — PROPOFOL 10 MG/ML IV BOLUS
INTRAVENOUS | Status: DC | PRN
  Administered 2022-07-25: 30 mg via INTRAVENOUS

## 2022-07-25 MED ORDER — OXYCODONE HCL 5 MG PO TABS
10.0000 mg | ORAL_TABLET | ORAL | Status: DC | PRN
  Administered 2022-07-25: 10 mg via ORAL
  Filled 2022-07-25: qty 2

## 2022-07-25 MED ORDER — METHOCARBAMOL 500 MG PO TABS
500.0000 mg | ORAL_TABLET | Freq: Four times a day (QID) | ORAL | Status: DC | PRN
  Administered 2022-07-25 (×2): 500 mg via ORAL
  Filled 2022-07-25 (×2): qty 1

## 2022-07-25 MED ORDER — EPHEDRINE 5 MG/ML INJ
INTRAVENOUS | Status: AC
  Filled 2022-07-25: qty 5

## 2022-07-25 MED ORDER — PHENYLEPHRINE HCL-NACL 20-0.9 MG/250ML-% IV SOLN
INTRAVENOUS | Status: DC | PRN
  Administered 2022-07-25: 20 ug/min via INTRAVENOUS

## 2022-07-25 MED ORDER — ACETAMINOPHEN 500 MG PO TABS
1000.0000 mg | ORAL_TABLET | Freq: Once | ORAL | Status: AC
  Administered 2022-07-25: 1000 mg via ORAL
  Filled 2022-07-25: qty 2

## 2022-07-25 MED ORDER — MIDAZOLAM HCL 2 MG/2ML IJ SOLN
INTRAMUSCULAR | Status: AC
  Filled 2022-07-25: qty 2

## 2022-07-25 MED ORDER — MENTHOL 3 MG MT LOZG: 1.0000 | LOZENGE | OROMUCOSAL | Status: DC | PRN

## 2022-07-25 MED ORDER — ORAL CARE MOUTH RINSE: 15.0000 mL | Freq: Once | OROMUCOSAL | Status: AC

## 2022-07-25 MED ORDER — EPHEDRINE SULFATE-NACL 50-0.9 MG/10ML-% IV SOSY
PREFILLED_SYRINGE | INTRAVENOUS | Status: DC | PRN
  Administered 2022-07-25: 5 mg via INTRAVENOUS

## 2022-07-25 SURGICAL SUPPLY — 86 items
ADH SKN CLS APL DERMABOND .7 (GAUZE/BANDAGES/DRESSINGS) ×1
ALCOHOL 70% 16 OZ (MISCELLANEOUS) ×1 IMPLANT
BAG COUNTER SPONGE SURGICOUNT (BAG) IMPLANT
BAG DECANTER FOR FLEXI CONT (MISCELLANEOUS) ×1 IMPLANT
BAG SPNG CNTER NS LX DISP (BAG)
BANDAGE ESMARK 6X9 LF (GAUZE/BANDAGES/DRESSINGS) IMPLANT
BLADE SAG 18X100X1.27 (BLADE) ×1 IMPLANT
BLADE SAW SGTL 13X75X1.27 (BLADE) IMPLANT
BLADE SAW SGTL 73X25 THK (BLADE) ×1 IMPLANT
BNDG CMPR 9X6 STRL LF SNTH (GAUZE/BANDAGES/DRESSINGS)
BNDG ESMARK 6X9 LF (GAUZE/BANDAGES/DRESSINGS)
BOWL SMART MIX CTS (DISPOSABLE) ×1 IMPLANT
BSPLAT TIB 5D E CMNT KN RT (Knees) ×1 IMPLANT
CEMENT BONE REFOBACIN R1X40 US (Cement) IMPLANT
CLSR STERI-STRIP ANTIMIC 1/2X4 (GAUZE/BANDAGES/DRESSINGS) ×2 IMPLANT
COOLER ICEMAN CLASSIC (MISCELLANEOUS) ×1 IMPLANT
COVER SURGICAL LIGHT HANDLE (MISCELLANEOUS) ×1 IMPLANT
CUFF TOURN SGL QUICK 34 (TOURNIQUET CUFF) ×1
CUFF TOURN SGL QUICK 42 (TOURNIQUET CUFF) IMPLANT
CUFF TRNQT CYL 34X4.125X (TOURNIQUET CUFF) ×1 IMPLANT
DERMABOND ADVANCED .7 DNX12 (GAUZE/BANDAGES/DRESSINGS) ×1 IMPLANT
DRAPE EXTREMITY T 121X128X90 (DISPOSABLE) ×1 IMPLANT
DRAPE HALF SHEET 40X57 (DRAPES) ×1 IMPLANT
DRAPE INCISE IOBAN 66X45 STRL (DRAPES) ×1 IMPLANT
DRAPE ORTHO SPLIT 77X108 STRL (DRAPES) ×1
DRAPE POUCH INSTRU U-SHP 10X18 (DRAPES) ×1 IMPLANT
DRAPE SURG ORHT 6 SPLT 77X108 (DRAPES) IMPLANT
DRAPE U-SHAPE 47X51 STRL (DRAPES) ×2 IMPLANT
DRSG AQUACEL AG ADV 3.5X10 (GAUZE/BANDAGES/DRESSINGS) ×1 IMPLANT
DURAPREP 26ML APPLICATOR (WOUND CARE) ×3 IMPLANT
ELECT CAUTERY BLADE 6.4 (BLADE) ×1 IMPLANT
ELECT PENCIL ROCKER SW 15FT (MISCELLANEOUS) ×1 IMPLANT
ELECT REM PT RETURN 9FT ADLT (ELECTROSURGICAL) ×1
ELECTRODE REM PT RTRN 9FT ADLT (ELECTROSURGICAL) ×1 IMPLANT
FEMUR CMT CR STD SZ 6 RT KNEE (Joint) ×1 IMPLANT
FEMUR CMTD CR STD SZ 6 RT KNEE (Joint) IMPLANT
GLOVE BIOGEL PI IND STRL 7.0 (GLOVE) ×2 IMPLANT
GLOVE BIOGEL PI IND STRL 7.5 (GLOVE) ×5 IMPLANT
GLOVE ECLIPSE 7.0 STRL STRAW (GLOVE) ×3 IMPLANT
GLOVE INDICATOR 7.0 STRL GRN (GLOVE) ×1 IMPLANT
GLOVE INDICATOR 7.5 STRL GRN (GLOVE) ×1 IMPLANT
GLOVE SURG SYN 7.5 E (GLOVE) ×2 IMPLANT
GLOVE SURG SYN 7.5 PF PI (GLOVE) ×2 IMPLANT
GLOVE SURG UNDER LTX SZ7.5 (GLOVE) ×2 IMPLANT
GLOVE SURG UNDER POLY LF SZ7 (GLOVE) ×2 IMPLANT
GOWN STRL REUS W/ TWL LRG LVL3 (GOWN DISPOSABLE) ×1 IMPLANT
GOWN STRL REUS W/TWL LRG LVL3 (GOWN DISPOSABLE) ×1
GOWN STRL SURGICAL XL XLNG (GOWN DISPOSABLE) ×1 IMPLANT
GOWN TOGA ZIPPER T7+ PEEL AWAY (MISCELLANEOUS) ×2 IMPLANT
HANDPIECE INTERPULSE COAX TIP (DISPOSABLE) ×1
HOOD PEEL AWAY T7 (MISCELLANEOUS) ×1 IMPLANT
INSERT TIB ASF SZ 6-7/EF 10 RT (Insert) IMPLANT
KIT BASIN OR (CUSTOM PROCEDURE TRAY) ×1 IMPLANT
KIT TURNOVER KIT B (KITS) ×1 IMPLANT
MANIFOLD NEPTUNE II (INSTRUMENTS) ×1 IMPLANT
MARKER SKIN DUAL TIP RULER LAB (MISCELLANEOUS) ×2 IMPLANT
NDL SPNL 18GX3.5 QUINCKE PK (NEEDLE) ×1 IMPLANT
NEEDLE SPNL 18GX3.5 QUINCKE PK (NEEDLE) ×1 IMPLANT
NS IRRIG 1000ML POUR BTL (IV SOLUTION) ×1 IMPLANT
PACK TOTAL JOINT (CUSTOM PROCEDURE TRAY) ×1 IMPLANT
PAD ARMBOARD 7.5X6 YLW CONV (MISCELLANEOUS) ×2 IMPLANT
PAD COLD SHLDR WRAP-ON (PAD) ×1 IMPLANT
PIN DRILL HDLS TROCAR 75 4PK (PIN) IMPLANT
SCREW FEMALE HEX FIX 25X2.5 (ORTHOPEDIC DISPOSABLE SUPPLIES) IMPLANT
SET HNDPC FAN SPRY TIP SCT (DISPOSABLE) ×1 IMPLANT
SOLUTION PRONTOSAN WOUND 350ML (IRRIGATION / IRRIGATOR) ×1 IMPLANT
STAPLER VISISTAT 35W (STAPLE) IMPLANT
STEM POLY PAT PLY 32M KNEE (Knees) IMPLANT
STEM TIB ST PERS 14+30 (Stem) IMPLANT
STEM TIBIA 5 DEG SZ E R KNEE (Knees) IMPLANT
SUCTION TUBE FRAZIER 10FR DISP (SUCTIONS) ×1 IMPLANT
SUT ETHILON 2 0 FS 18 (SUTURE) IMPLANT
SUT MNCRL AB 3-0 PS2 27 (SUTURE) IMPLANT
SUT VIC AB 0 CT1 27 (SUTURE) ×1
SUT VIC AB 0 CT1 27XBRD ANBCTR (SUTURE) ×2 IMPLANT
SUT VIC AB 1 CTX 27 (SUTURE) ×3 IMPLANT
SUT VIC AB 2-0 CT1 27 (SUTURE) ×3
SUT VIC AB 2-0 CT1 TAPERPNT 27 (SUTURE) ×4 IMPLANT
SYR 50ML LL SCALE MARK (SYRINGE) ×2 IMPLANT
TIBIA STEM 5 DEG SZ E R KNEE (Knees) ×1 IMPLANT
TOWEL GREEN STERILE (TOWEL DISPOSABLE) ×1 IMPLANT
TOWEL GREEN STERILE FF (TOWEL DISPOSABLE) ×1 IMPLANT
TRAY CATH INTERMITTENT SS 16FR (CATHETERS) IMPLANT
TUBE SUCT ARGYLE STRL (TUBING) ×1 IMPLANT
UNDERPAD 30X36 HEAVY ABSORB (UNDERPADS AND DIAPERS) ×1 IMPLANT
YANKAUER SUCT BULB TIP NO VENT (SUCTIONS) ×2 IMPLANT

## 2022-07-25 NOTE — Anesthesia Procedure Notes (Signed)
Anesthesia Regional Block: Adductor canal block   Pre-Anesthetic Checklist: , timeout performed,  Correct Patient, Correct Site, Correct Laterality,  Correct Procedure, Correct Position, site marked,  Risks and benefits discussed,  Surgical consent,  Pre-op evaluation,  At surgeon's request and post-op pain management  Laterality: Lower and Right  Prep: chloraprep       Needles:  Injection technique: Single-shot  Needle Type: Stimiplex     Needle Length: 9cm  Needle Gauge: 21     Additional Needles:   Procedures:,,,, ultrasound used (permanent image in chart),,    Narrative:  Start time: 07/25/2022 6:56 AM End time: 07/25/2022 7:16 AM Injection made incrementally with aspirations every 5 mL.  Performed by: Personally  Anesthesiologist: Lewie Loron, MD  Additional Notes: BP cuff, EKG monitors applied. Sedation begun. Artery and nerve location verified with ultrasound. Anesthetic injected incrementally (5ml), slowly, and after negative aspirations under direct u/s guidance. Good fascial/perineural spread. Tolerated well.

## 2022-07-25 NOTE — H&P (Signed)
PREOPERATIVE H&P  Chief Complaint: right knee osteoarthritis  HPI: Paula Conway is a 67 y.o. female who presents for surgical treatment of right knee osteoarthritis.  She denies any changes in medical history.  Past Medical History:  Diagnosis Date   Allergy    Arthritis    Complication of anesthesia    waking up during colonoscopy and difficulty waking up   Environmental and seasonal allergies 07/20/2015   Family history of adverse reaction to anesthesia    pt's mother and daughter have difficulty waking up   Family history of breast cancer in Mother 07/20/2015   Mother was diagnosed at 6 years old and died age 27 of breast cancer    GERD (gastroesophageal reflux disease)    Headache    History of kidney stones    Hyperlipidemia    Hypertension    stressed induced   Obesity 07/20/2015   Pre-diabetes    Spinal stenosis    Past Surgical History:  Procedure Laterality Date   APPENDECTOMY     BREAST BIOPSY Right    COLONOSCOPY     core biopsy     VAGINA SURGERY     after having a baby   Social History   Socioeconomic History   Marital status: Widowed    Spouse name: Not on file   Number of children: 2   Years of education: Not on file   Highest education level: Not on file  Occupational History   Not on file  Tobacco Use   Smoking status: Former    Current packs/day: 0.00    Average packs/day: 0.3 packs/day for 3.0 years (0.8 ttl pk-yrs)    Types: Cigarettes    Start date: 01/11/1972    Quit date: 01/11/1975    Years since quitting: 47.5    Passive exposure: Past   Smokeless tobacco: Never  Vaping Use   Vaping status: Never Used  Substance and Sexual Activity   Alcohol use: Yes    Comment: socially   Drug use: No   Sexual activity: Yes  Other Topics Concern   Not on file  Social History Narrative   Not on file   Social Determinants of Health   Financial Resource Strain: Low Risk  (05/24/2022)   Overall Financial Resource Strain (CARDIA)     Difficulty of Paying Living Expenses: Not hard at all  Food Insecurity: No Food Insecurity (05/24/2022)   Hunger Vital Sign    Worried About Running Out of Food in the Last Year: Never true    Ran Out of Food in the Last Year: Never true  Transportation Needs: No Transportation Needs (05/24/2022)   PRAPARE - Administrator, Civil Service (Medical): No    Lack of Transportation (Non-Medical): No  Physical Activity: Inactive (05/24/2022)   Exercise Vital Sign    Days of Exercise per Week: 0 days    Minutes of Exercise per Session: 0 min  Stress: No Stress Concern Present (05/24/2022)   [employer] of Occupational Health - Occupational Stress Questionnaire    Feeling of Stress : Not at all  Social Connections: Moderately Integrated (05/24/2022)   Social Connection and Isolation Panel [NHANES]    Frequency of Communication with Friends and Family: More than three times a week    Frequency of Social Gatherings with Friends and Family: More than three times a week    Attends Religious Services: More than 4 times per year    Active Member of [employer]  or Organizations: Yes    Attends Banker Meetings: More than 4 times per year    Marital Status: Widowed   Family History  Problem Relation Age of Onset   Breast cancer Mother    Diabetes Father    Hypertension Father    Heart disease Father    No Known Allergies Prior to Admission medications   Medication Sig Start Date End Date Taking? Authorizing Provider  acetaminophen (TYLENOL) 650 MG CR tablet Take 650 mg by mouth every 8 (eight) hours as needed for pain.   Yes [provider]  Ascorbic Acid (VITAMIN C) 1000 MG tablet Take 1,000 mg by mouth daily.   Yes [provider]  aspirin EC 81 MG tablet Take 1 tablet (81 mg total) by mouth 2 (two) times daily. To be taken after surgery to prevent blood clots 07/18/22 07/18/23  Cristie Hem, PA-C  Carboxymeth-Glyc-Polysorb PF (REFRESH OPTIVE MEGA-3)  0.5-1-0.5 % SOLN Place 1 drop into both eyes as needed (dry eyes).   Yes [provider]  Cholecalciferol (VITAMIN D3) 5000 units CAPS Take 5,000 Units by mouth daily.   Yes [provider]  diclofenac Sodium (VOLTAREN) 1 % GEL Apply 1 Application topically 2 (two) times daily as needed (joint pain).   Yes [provider]  docusate sodium (COLACE) 100 MG capsule Take 1 capsule (100 mg total) by mouth daily as needed. 07/18/22 07/18/23  Cristie Hem, PA-C  famotidine (PEPCID) 20 MG tablet Take 1 tablet (20 mg total) by mouth at bedtime. 05/31/22  Yes Saralyn Pilar A, PA  Lactobacillus-Inulin (CULTURELLE DIGESTIVE HEALTH) CAPS Take 1 capsule by mouth daily.   Yes [provider]  loratadine (CLARITIN) 10 MG tablet Take 10 mg by mouth every other day.   Yes [provider]  methocarbamol (ROBAXIN-750) 750 MG tablet Take 1 tablet (750 mg total) by mouth 2 (two) times daily as needed for muscle spasms. 07/18/22   Cristie Hem, PA-C  Multiple Vitamin (MULTIVITAMIN) tablet Take 1 tablet by mouth daily.   Yes [provider]  naproxen sodium (ALEVE) 220 MG tablet Take 220 mg by mouth daily as needed (Pain).   Yes [provider]  omeprazole (PRILOSEC) 20 MG capsule Take 20 mg by mouth daily. 05/11/22  Yes [provider]  ondansetron (ZOFRAN) 4 MG tablet Take 1 tablet (4 mg total) by mouth every 8 (eight) hours as needed for nausea or vomiting. 07/18/22   Cristie Hem, PA-C  oxyCODONE-acetaminophen (PERCOCET) 5-325 MG tablet Take 1-2 tablets by mouth every 6 (six) hours as needed. To be taken after surgery 07/18/22   Cristie Hem, PA-C  pregabalin (LYRICA) 75 MG capsule Take 1 capsule (75 mg total) by mouth 2 (two) times daily. 05/31/22  Yes Saralyn Pilar A, PA  Probiotic Product (PROBIOTIC PO) Take 1 tablet by mouth daily.   Yes [provider]  rosuvastatin (CRESTOR) 5 MG tablet Take 1 tablet (5 mg total) by mouth at  bedtime. 05/31/22  Yes [name], PA  Turmeric (QC TUMERIC COMPLEX PO) Take 1 tablet by mouth 2 (two) times a week.   Yes [provider]     Positive ROS: All other systems have been reviewed and were otherwise negative with the exception of those mentioned in the HPI and as above.  Physical Exam: General: Alert, no acute distress Cardiovascular: No pedal edema Respiratory: No cyanosis, no use of accessory musculature GI: abdomen soft Skin: No lesions  in the area of chief complaint Neurologic: Sensation intact distally Psychiatric: Patient is competent for consent with normal mood and affect Lymphatic: no lymphedema  MUSCULOSKELETAL: exam stable  Assessment: right knee osteoarthritis  Plan: Plan for Procedure(s): RIGHT TOTAL KNEE ARTHROPLASTY  The risks benefits and alternatives were discussed with the patient including but not limited to the risks of nonoperative treatment, versus surgical intervention including infection, bleeding, nerve injury,  blood clots, cardiopulmonary complications, morbidity, mortality, among others, and they were willing to proceed.   Glee Arvin, MD 07/25/2022 6:14 AM

## 2022-07-25 NOTE — Anesthesia Procedure Notes (Signed)
Spinal  Patient location during procedure: OR Start time: 07/25/2022 7:23 AM End time: 07/25/2022 7:28 AM Reason for block: surgical anesthesia Staffing Performed: anesthesiologist  Anesthesiologist: Lewie Loron, MD Performed by: Lewie Loron, MD Authorized by: Lewie Loron, MD   Preanesthetic Checklist Completed: patient identified, IV checked, site marked, risks and benefits discussed, surgical consent, monitors and equipment checked, pre-op evaluation and timeout performed Spinal Block Patient position: sitting Prep: DuraPrep and site prepped and draped Patient monitoring: heart rate, continuous pulse ox and blood pressure Approach: right paramedian Location: L3-4 Injection technique: single-shot Needle Needle type: Spinocan  Needle gauge: 25 G Needle length: 9 cm Additional Notes Expiration date of kit checked and confirmed. Patient tolerated procedure well, without complications.

## 2022-07-25 NOTE — Evaluation (Signed)
Physical Therapy Evaluation Patient Details Name: Paula Conway MRN: [id number] DOB: 07-02-1955 Today's Date: 07/25/2022  History of Present Illness  67 y.o. female presents to [facility] hospital on 07/25/2022 for elective R TKA. PMH includes OA, GERD, HLD, HTN.  Clinical Impression  Pt presents to PT with deficits in ROM, strength, balance, gait. Pt is mobilizing well s/p TKA, ambulating for household distances at this time. PT provides education on TKA exercise packet. PT will follow up tomorrow for further gait and stair training.      Assistance Recommended at Discharge PRN  If plan is discharge home, recommend the following:  Can travel by private vehicle  A little help with bathing/dressing/bathroom;Assistance with cooking/housework;Assist for transportation;Help with stairs or ramp for entrance        Equipment Recommendations None recommended by PT  Recommendations for Other Services       Functional Status Assessment Patient has had a recent decline in their functional status and demonstrates the ability to make significant improvements in function in a reasonable and predictable amount of time.     Precautions / Restrictions Precautions Precautions: Knee;Fall Precaution Booklet Issued: Yes (comment) Restrictions Weight Bearing Restrictions: Yes RLE Weight Bearing: Weight bearing as tolerated      Mobility  Bed Mobility Overal bed mobility: Needs Assistance Bed Mobility: Sit to Supine, Supine to Sit     Supine to sit: Supervision Sit to supine: Supervision        Transfers Overall transfer level: Needs assistance Equipment used: Rolling walker (2 wheels), None Transfers: Sit to/from Stand Sit to Stand: Supervision                Ambulation/Gait Ambulation/Gait assistance: Supervision Gait Distance (Feet): 150 Feet Assistive device: Rolling walker (2 wheels) Gait Pattern/deviations: Step-through pattern Gait velocity: reduced Gait velocity  interpretation: 1.31 - 2.62 ft/sec, indicative of limited community ambulator   General Gait Details: slowed step-through gait  Stairs            Wheelchair Mobility     Tilt Bed    Modified Rankin (Stroke Patients Only)       Balance Overall balance assessment: Needs assistance Sitting-balance support: No upper extremity supported, Feet supported Sitting balance-Leahy Scale: Good     Standing balance support: Reliant on assistive device for balance, Single extremity supported Standing balance-Leahy Scale: Poor                               Pertinent Vitals/Pain Pain Assessment Pain Assessment: Faces Faces Pain Scale: Hurts even more Pain Location: R knee Pain Descriptors / Indicators: Sore Pain Intervention(s): Monitored during session    Home Living Family/patient expects to be discharged to:: Private residence Living Arrangements: Alone Available Help at Discharge: Family;Available 24 hours/day Type of Home: House Home Access: Stairs to enter Entrance Stairs-Rails: None Entrance Stairs-Number of Steps: 2 Alternate Level Stairs-Number of Steps: 15 Home Layout: Two level Home Equipment: Standard Walker;Rollator (4 wheels);Shower seat - built in;Cane - single point      Prior Function Prior Level of Function : Independent/Modified Independent;Driving                     Hand Dominance        Extremity/Trunk Assessment   Upper Extremity Assessment Upper Extremity Assessment: Overall WFL for tasks assessed    Lower Extremity Assessment Lower Extremity Assessment: RLE deficits/detail RLE Deficits / Details: post-op ROM deficits as  expected s/p TKA, generalized weakness RLE Sensation: decreased light touch    Cervical / Trunk Assessment Cervical / Trunk Assessment: Normal  Communication   Communication: No difficulties  Cognition Arousal/Alertness: Awake/alert Behavior During Therapy: WFL for tasks  assessed/performed Overall Cognitive Status: Within Functional Limits for tasks assessed                                          General Comments General comments (skin integrity, edema, etc.): VSS on RA    Exercises     Assessment/Plan    PT Assessment Patient needs continued PT services  PT Problem List Decreased strength;Decreased range of motion;Decreased activity tolerance;Decreased balance;Decreased mobility;Pain       PT Treatment Interventions DME instruction;Gait training;Stair training;Functional mobility training;Therapeutic activities;Therapeutic exercise;Neuromuscular re-education;Balance training;Patient/family education    PT Goals (Current goals can be found in the Care Plan section)  Acute Rehab PT Goals Patient Stated Goal: to go home PT Goal Formulation: With patient Time For Goal Achievement: 07/29/22 Potential to Achieve Goals: Good    Frequency 7X/week     Co-evaluation               AM-PAC PT "6 Clicks" Mobility  Outcome Measure Help needed turning from your back to your side while in a flat bed without using bedrails?: A Little Help needed moving from lying on your back to sitting on the side of a flat bed without using bedrails?: A Little Help needed moving to and from a bed to a chair (including a wheelchair)?: A Little Help needed standing up from a chair using your arms (e.g., wheelchair or bedside chair)?: A Little Help needed to walk in hospital room?: A Little Help needed climbing 3-5 steps with a railing? : Total 6 Click Score: 16    End of Session   Activity Tolerance: Patient tolerated treatment well Patient left: in bed;with call bell/phone within reach Nurse Communication: Mobility status PT Visit Diagnosis: Other abnormalities of gait and mobility (R26.89);Muscle weakness (generalized) (M62.81);Pain Pain - Right/Left: Right Pain - part of body: Knee    Time: 1610-9604 PT Time Calculation (min) (ACUTE  ONLY): 28 min   Charges:   PT Evaluation $PT Eval Low Complexity: 1 Low   PT General Charges $$ ACUTE PT VISIT: 1 Visit         Arlyss Gandy, PT, DPT Acute Rehabilitation Office [phone]   Arlyss Gandy 07/25/2022, 4:51 PM

## 2022-07-25 NOTE — Anesthesia Postprocedure Evaluation (Signed)
Anesthesia Post Note  Patient: Paula Conway  Procedure(s) Performed: RIGHT TOTAL KNEE ARTHROPLASTY (Right: Knee)     Patient location during evaluation: PACU Anesthesia Type: Spinal Level of consciousness: sedated and patient cooperative Pain management: pain level controlled Vital Signs Assessment: post-procedure vital signs reviewed and stable Respiratory status: spontaneous breathing Cardiovascular status: stable Anesthetic complications: no   No notable events documented.  Last Vitals:  Vitals:   07/25/22 1151 07/25/22 1516  BP: (!) 109/55 120/60  Pulse: (!) 58 61  Resp: 20 19  Temp: 36.6 C 36.4 C  SpO2: 98% 98%    Last Pain:  Vitals:   07/25/22 1516  TempSrc: Oral  PainSc:                  Lewie Loron

## 2022-07-25 NOTE — Op Note (Signed)
Total Knee Arthroplasty Procedure Note  Preoperative diagnosis: Right knee osteoarthritis  Postoperative diagnosis:same  Operative findings: Complete loss of joint space Osteopenic bone  Operative procedure: Right total knee arthroplasty. CPT [phone]  Surgeon: N. Glee Arvin, MD  Assist: Oneal Grout, PA-C; necessary for the timely completion of procedure and due to complexity of procedure.  Anesthesia: Spinal, regional, local  Tourniquet time: see anesthesia record  Implants used: Zimmer persona cemented Femur: CR 6  Tibia: E with 30 mm cemented stem Patella: 32 mm Polyethylene: 10 mm medial congruent  Indication: Paula Conway is a 67 y.o. year old female with a history of knee pain. Having failed conservative management, the patient elected to proceed with a total knee arthroplasty.  We have reviewed the risk and benefits of the surgery and they elected to proceed after voicing understanding.  Procedure:  After informed consent was obtained and understanding of the risk were voiced including but not limited to bleeding, infection, damage to surrounding structures including nerves and vessels, blood clots, leg length inequality and the failure to achieve desired results, the operative extremity was marked with verbal confirmation of the patient in the holding area.   The patient was then brought to the operating room and transported to the operating room table in the supine position.  A tourniquet was applied to the operative extremity around the upper thigh. The operative limb was then prepped and draped in the usual sterile fashion and preoperative antibiotics were administered.  A time out was performed prior to the start of surgery confirming the correct extremity, preoperative antibiotic administration, as well as team members, implants and instruments available for the case. Correct surgical site was also confirmed with preoperative radiographs. The limb was  then elevated for exsanguination and the tourniquet was inflated. A midline incision was made and a standard medial parapatellar approach was performed.  The infrapatellar fat pad was removed.  Suprapatellar synovium was removed to reveal the anterior distal femoral cortex.  A medial peel was performed to release the capsule of the medial tibial plateau.  The patella was then everted and was prepared and sized to a 32 mm.  A cover was placed on the patella for protection from retractors.  The knee was then brought into full flexion and we then turned our attention to the femur.  The cruciates were sacrificed.  Start site was drilled in the femur and the intramedullary distal femoral cutting guide was placed, set at 5 degrees valgus, taking 10 mm of distal resection. The distal cut was made. Osteophytes were then removed.  Next, the proximal tibial cutting guide was placed with appropriate slope, varus/valgus alignment and depth of resection. The proximal tibial cut was made taking 2 mm off the low side. Gap blocks were then used to assess the extension gap and alignment, and appropriate soft tissue releases were performed. Attention was turned back to the femur, which was sized using the sizing guide to a size 6. Appropriate rotation of the femoral component was determined using epicondylar axis, Whiteside's line, and assessing the flexion gap under ligament tension. The appropriate size 4-in-1 cutting block was placed and checked with an angel wing and cuts were made. Posterior femoral osteophytes and uncapped bone were then removed with the curved osteotome.  Trial components were placed, and stability was checked in full extension, mid-flexion, and deep flexion. Proper tibial rotation was determined and marked.  The patella tracked well without a lateral release.  The femoral lugs were  then drilled. Trial components were then removed and tibial preparation performed.  The tibia was sized for a size E component.   I felt that the knee was a little tight in flexion and extension so I took an additional 2 mm from the tibia.  Sclerotic bone was drilled. The bony surfaces were irrigated with a pulse lavage and then dried. Bone cement was vacuum mixed on the back table, and the final components sized above were cemented into place.  Antibiotic irrigation was placed in the knee joint and soft tissues while the cement cured.  After cement had finished curing, excess cement was removed. The stability of the construct was re-evaluated throughout a range of motion and found to be acceptable. The trial liner was removed, the knee was copiously irrigated, and the knee was re-evaluated for any excess bone debris. The real polyethylene liner, 10 mm thick, was inserted and checked to ensure the locking mechanism had engaged appropriately. The tourniquet was deflated and hemostasis was achieved. The wound was irrigated with normal saline.  One gram of vancomycin powder was placed in the surgical bed.  Topical 0.25% bupivacaine and meloxicam was placed in the joint for postoperative pain.  Capsular closure was performed with a #1 vicryl, subcutaneous fat closed with a 0 vicryl suture, then subcutaneous tissue closed with interrupted 2.0 vicryl suture. The skin was then closed with a 2.0 nylon and dermabond. A sterile dressing was applied.  The patient was awakened in the operating room and taken to recovery in stable condition. All sponge, needle, and instrument counts were correct at the end of the case.  Paula Conway was necessary for opening, closing, retracting, limb positioning and overall facilitation and completion of the surgery.  Position: supine  Complications: none.  Time Out: performed   Drains/Packing: none  Estimated blood loss: minimal  Returned to Recovery Room: in good condition.   Antibiotics: yes   Mechanical VTE (DVT) Prophylaxis: sequential compression devices, TED thigh-high  Chemical VTE (DVT)  Prophylaxis: aspirin  Fluid Replacement  Crystalloid: see anesthesia record Blood: none  FFP: none   Specimens Removed: 1 to pathology   Sponge and Instrument Count Correct? yes   PACU: portable radiograph - knee AP and Lateral   Plan/RTC: Return in 2 weeks for wound check.   Weight Bearing/Load Lower Extremity: full   Implant Name Type Inv. Item Serial No. Manufacturer Lot No. LRB No. Used Action  CEMENT BONE REFOBACIN [id number] [name] - [id number] Cement CEMENT BONE REFOBACIN [id number] [name]  ZIMMER RECON(ORTH,TRAU,BIO,SG) [id number] Right 2 Implanted  STEM TIB ST PERS 14+30 - [id number] Stem STEM TIB ST PERS 14+30  ZIMMER RECON(ORTH,TRAU,BIO,SG) [id number] Right 1 Implanted  BSPLAT TIB 5D E CMNT KN RT - [id number] Knees BSPLAT TIB 5D E CMNT KN RT  ZIMMER RECON(ORTH,TRAU,BIO,SG) [id number] Right 1 Implanted  STEM POLY PAT PLY 56M KNEE - [id number] Knees STEM POLY PAT PLY 56M KNEE  ZIMMER RECON(ORTH,TRAU,BIO,SG) [id number] Right 1 Implanted  FEMUR CMT CR STD SZ 6 RT KNEE - [id number] Joint FEMUR CMT CR STD SZ 6 RT KNEE  ZIMMER RECON(ORTH,TRAU,BIO,SG) [id number] Right 1 Implanted  INSERT TIB ASF SZ 6-7/EF 10 RT - [id number] Insert INSERT TIB ASF SZ 6-7/EF 10 RT  ZIMMER RECON(ORTH,TRAU,BIO,SG) [id number] Right 1 Implanted    N. Glee Arvin, MD [facility] 8:57 AM

## 2022-07-25 NOTE — Transfer of Care (Signed)
Immediate Anesthesia Transfer of Care Note  Patient: Paula Conway  Procedure(s) Performed: RIGHT TOTAL KNEE ARTHROPLASTY (Right: Knee)  Patient Location: PACU  Anesthesia Type:MAC combined with regional for post-op pain  Level of Consciousness: alert , oriented, and patient cooperative  Airway & Oxygen Therapy: Patient Spontanous Breathing  Post-op Assessment: Report given to RN and Post -op Vital signs reviewed and stable  Post vital signs: Reviewed and stable  Last Vitals:  Vitals Value Taken Time  BP 117/65   Temp 97.0   Pulse 57 07/25/22 0932  Resp 13 07/25/22 0932  SpO2 93 % 07/25/22 0932  Vitals shown include unfiled device data.  Last Pain:  Vitals:   07/25/22 0616  TempSrc:   PainSc: 0-No pain         Complications: No notable events documented.

## 2022-07-25 NOTE — Discharge Instructions (Signed)

## 2022-07-26 ENCOUNTER — Other Ambulatory Visit: Payer: Self-pay | Admitting: *Deleted

## 2022-07-26 ENCOUNTER — Encounter (HOSPITAL_COMMUNITY): Payer: Self-pay | Admitting: Orthopaedic Surgery

## 2022-07-26 DIAGNOSIS — Z87891 Personal history of nicotine dependence: Secondary | ICD-10-CM | POA: Diagnosis not present

## 2022-07-26 DIAGNOSIS — Z96651 Presence of right artificial knee joint: Secondary | ICD-10-CM

## 2022-07-26 DIAGNOSIS — Z7982 Long term (current) use of aspirin: Secondary | ICD-10-CM | POA: Diagnosis not present

## 2022-07-26 DIAGNOSIS — I10 Essential (primary) hypertension: Secondary | ICD-10-CM | POA: Diagnosis not present

## 2022-07-26 DIAGNOSIS — Z79899 Other long term (current) drug therapy: Secondary | ICD-10-CM | POA: Diagnosis not present

## 2022-07-26 DIAGNOSIS — M1711 Unilateral primary osteoarthritis, right knee: Secondary | ICD-10-CM | POA: Diagnosis not present

## 2022-07-26 LAB — CBC
HCT: 32 % — ABNORMAL LOW (ref 36.0–46.0)
Hemoglobin: 10.5 g/dL — ABNORMAL LOW (ref 12.0–15.0)
MCH: 29.6 pg (ref 26.0–34.0)
MCHC: 32.8 g/dL (ref 30.0–36.0)
MCV: 90.1 fL (ref 80.0–100.0)
Platelets: 165 10*3/uL (ref 150–400)
RBC: 3.55 MIL/uL — ABNORMAL LOW (ref 3.87–5.11)
RDW: 13.7 % (ref 11.5–15.5)
WBC: 11.3 10*3/uL — ABNORMAL HIGH (ref 4.0–10.5)
nRBC: 0 % (ref 0.0–0.2)

## 2022-07-26 MED ORDER — DEXAMETHASONE SODIUM PHOSPHATE 10 MG/ML IJ SOLN
10.0000 mg | Freq: Once | INTRAMUSCULAR | Status: AC
  Administered 2022-07-26: 10 mg via INTRAVENOUS
  Filled 2022-07-26: qty 1

## 2022-07-26 NOTE — Evaluation (Signed)
Occupational Therapy Evaluation Patient Details Name: Paula Conway MRN: [id number] DOB: [birth year] Today's Date: 07/26/2022   History of Present Illness 67 y.o. female presents to [facility] hospital on 07/25/2022 for elective R TKA. PMH includes OA, GERD, HLD, HTN.   Clinical Impression   Patient evaluated by Occupational Therapy with no further acute OT needs identified. All education has been completed and the patient has no further questions. See below for any follow-up Occupational Therapy or equipment needs. OT to sign off. Thank you for referral.        Recommendations for follow up therapy are one component of a multi-disciplinary discharge planning process, led by the attending physician.  Recommendations may be updated based on patient status, additional functional criteria and insurance authorization.   Assistance Recommended at Discharge None  Patient can return home with the following Assist for transportation    Functional Status Assessment     Equipment Recommendations  Other (comment) (RW)    Recommendations for Other Services       Precautions / Restrictions Precautions Precautions: Knee;Fall Precaution Booklet Issued: Yes (comment) Restrictions Weight Bearing Restrictions: Yes RLE Weight Bearing: Weight bearing as tolerated      Mobility Bed Mobility Overal bed mobility: Modified Independent                  Transfers Overall transfer level: Needs assistance Equipment used: Rolling walker (2 wheels) Transfers: Sit to/from Stand Sit to Stand: Supervision                  Balance Overall balance assessment: Needs assistance Sitting-balance support: No upper extremity supported, Feet supported Sitting balance-Leahy Scale: Good     Standing balance support: Single extremity supported, Reliant on assistive device for balance Standing balance-Leahy Scale: Poor                             ADL either performed or assessed with  clinical judgement   ADL Overall ADL's : Needs assistance/impaired Eating/Feeding: Independent   Grooming: Independent           Upper Body Dressing : Independent   Lower Body Dressing: Supervision/safety;Sit to/from stand Lower Body Dressing Details (indicate cue type and reason): able to complete without AE and education to dress operative side first Toilet Transfer: Supervision/safety           Functional mobility during ADLs: Supervision/safety;Rolling walker (2 wheels)       Vision Baseline Vision/History: 1 Wears glasses Ability to See in Adequate Light: 0 Adequate Patient Visual Report: No change from baseline Vision Assessment?: No apparent visual deficits     Perception     Praxis      Pertinent Vitals/Pain Pain Assessment Pain Assessment: Faces Faces Pain Scale: Hurts little more Pain Location: R knee Pain Descriptors / Indicators: Sore Pain Intervention(s): Monitored during session, Premedicated before session, Repositioned     Hand Dominance Right   Extremity/Trunk Assessment Upper Extremity Assessment Upper Extremity Assessment: Overall WFL for tasks assessed   Lower Extremity Assessment Lower Extremity Assessment: Defer to PT evaluation   Cervical / Trunk Assessment Cervical / Trunk Assessment: Normal   Communication Communication Communication: No difficulties   Cognition Arousal/Alertness: Awake/alert Behavior During Therapy: WFL for tasks assessed/performed Overall Cognitive Status: Within Functional Limits for tasks assessed  General Comments  RA    Exercises     Shoulder Instructions      Home Living Family/patient expects to be discharged to:: Private residence Living Arrangements: Alone Available Help at Discharge: Family;Available 24 hours/day Type of Home: House Home Access: Stairs to enter [employer] of Steps: 2 Entrance Stairs-Rails: None Home  Layout: Two level Alternate Level Stairs-Number of Steps: 15 Alternate Level Stairs-Rails: Left Bathroom Shower/Tub: Chief Strategy Officer: Standard     Home Equipment: Retail banker (4 wheels);Shower seat - built in;Cane - single point   Additional Comments: will have family - Son staying until saturday and then daughter coming for extended stay working from patients home      Prior Functioning/Environment Prior Level of Function : Independent/Modified Independent;Driving                        OT Problem List:        OT Treatment/Interventions:      OT Goals(Current goals can be found in the care plan section)    OT Frequency:      Co-evaluation              AM-PAC OT "6 Clicks" Daily Activity     Outcome Measure Help from another person eating meals?: None Help from another person taking care of personal grooming?: None Help from another person toileting, which includes using toliet, bedpan, or urinal?: None Help from another person bathing (including washing, rinsing, drying)?: None Help from another person to put on and taking off regular upper body clothing?: None Help from another person to put on and taking off regular lower body clothing?: None 6 Click Score: 24   End of Session Equipment Utilized During Treatment: Rolling walker (2 wheels) Nurse Communication: Mobility status  Activity Tolerance: Patient tolerated treatment well Patient left: in bed;with call bell/phone within reach;with family/visitor present                   Time: 1610-9604 OT Time Calculation (min): 30 min Charges:  OT General Charges $OT Visit: 1 Visit OT Evaluation $OT Eval Moderate Complexity: 1 Mod OT Treatments $Self Care/Home Management : 8-22 mins   Brynn, OTR/L  Acute Rehabilitation Services Office: [phone] .   Mateo Flow 07/26/2022, 4:42 PM

## 2022-07-26 NOTE — Progress Notes (Signed)
Subjective: 1 Day Post-Op Procedure(s) (LRB): RIGHT TOTAL KNEE ARTHROPLASTY (Right) Patient reports pain as mild.    Objective: Vital signs in last 24 hours: Temp:  [97.6 F (36.4 C)-98.5 F (36.9 C)] 98.5 F (36.9 C) (07/16 0434) Pulse Rate:  [51-69] 69 (07/16 0434) Resp:  [9-20] 20 (07/16 0434) BP: (102-140)/(52-91) 124/61 (07/16 0434) SpO2:  [91 %-99 %] 97 % (07/16 0434)  Intake/Output from previous day: 07/15 0701 - 07/16 0700 In: 2140 [P.O.:1440; I.V.:700] Out: 3400 [Urine:3250; Blood:150] Intake/Output this shift: No intake/output data recorded.  Recent Labs    07/26/22 0012  HGB 10.5*   Recent Labs    07/26/22 0012  WBC 11.3*  RBC 3.55*  HCT 32.0*  PLT 165   No results for input(s): "NA", "K", "CL", "CO2", "BUN", "CREATININE", "GLUCOSE", "CALCIUM" in the last 72 hours. No results for input(s): "LABPT", "INR" in the last 72 hours.  Neurologically intact Neurovascular intact Sensation intact distally Intact pulses distally Dorsiflexion/Plantar flexion intact Incision: scant drainage No cellulitis present Compartment soft   Assessment/Plan: 1 Day Post-Op Procedure(s) (LRB): RIGHT TOTAL KNEE ARTHROPLASTY (Right) Advance diet Up with therapy D/C IV fluids Discharge home with home health once cleared by PT.  Has 15 stairs to navigate to get to bedroom WBAT RLE ABLA- mild and stable      Cristie Hem 07/26/2022, 8:00 AM

## 2022-07-26 NOTE — Plan of Care (Signed)
  Problem: Education: Goal: Knowledge of the prescribed therapeutic regimen will improve Outcome: Completed/Met Goal: Individualized Educational Video(s) Outcome: Completed/Met   Problem: Activity: Goal: Ability to avoid complications of mobility impairment will improve Outcome: Completed/Met Goal: Range of joint motion will improve Outcome: Completed/Met   Problem: Clinical Measurements: Goal: Postoperative complications will be avoided or minimized Outcome: Completed/Met   Problem: Pain Management: Goal: Pain level will decrease with appropriate interventions Outcome: Completed/Met   Problem: Skin Integrity: Goal: Will show signs of wound healing Outcome: Completed/Met  Patient alert and oriented, void, ambulate. Surgical site dressing changed per order. D/c instructions explain and given. Patient d/c home per order.

## 2022-07-26 NOTE — Progress Notes (Signed)
OT NOTE  OT NOTE  OT evaluation completed at this time. OT formal evaluation to follow. Pt currently adequate level to d/c with son present and recommendation for home with follow up set for mdMateo Flow   OTR/L Pager: [phone] Office: [phone]

## 2022-07-26 NOTE — Progress Notes (Signed)
Physical Therapy Treatment Patient Details Name: Paula Conway MRN: [id number] DOB: [birth year] Today's Date: 07/26/2022   History of Present Illness 67 y.o. female presents to [facility] hospital on 07/25/2022 for elective R TKA. PMH includes OA, GERD, HLD, HTN.    PT Comments  Pt tolerates treatment well, ambulating for increased distances and able to tolerate stair training with good stability. PT reviews ambulation frequency at discharge along with HEP progression. Pt will benefit from continued ambulation in an effort to restore independence. Pt is eager to discharge home.     Assistance Recommended at Discharge PRN  If plan is discharge home, recommend the following:  Can travel by private vehicle    A little help with bathing/dressing/bathroom;Assistance with cooking/housework;Assist for transportation;Help with stairs or ramp for entrance      Equipment Recommendations  None recommended by PT    Recommendations for Other Services       Precautions / Restrictions Precautions Precautions: Knee;Fall Precaution Booklet Issued: Yes (comment) Restrictions Weight Bearing Restrictions: Yes RLE Weight Bearing: Weight bearing as tolerated     Mobility  Bed Mobility Overal bed mobility: Modified Independent Bed Mobility: Supine to Sit, Sit to Supine     Supine to sit: Modified independent (Device/Increase time) Sit to supine: Modified independent (Device/Increase time)        Transfers Overall transfer level: Needs assistance Equipment used: Rolling walker (2 wheels) Transfers: Sit to/from Stand Sit to Stand: Supervision                Ambulation/Gait Ambulation/Gait assistance: Supervision Gait Distance (Feet): 200 Feet Assistive device: Rolling walker (2 wheels) Gait Pattern/deviations: Step-through pattern Gait velocity: reduced Gait velocity interpretation: 1.31 - 2.62 ft/sec, indicative of limited community ambulator   General Gait Details: slowed  step-through gait   Stairs Stairs: Yes Stairs assistance: Supervision Stair Management: One rail Left, Step to pattern Number of Stairs: 10     Wheelchair Mobility     Tilt Bed    Modified Rankin (Stroke Patients Only)       Balance Overall balance assessment: Needs assistance Sitting-balance support: No upper extremity supported, Feet supported Sitting balance-Leahy Scale: Good     Standing balance support: Single extremity supported, Reliant on assistive device for balance Standing balance-Leahy Scale: Poor                              Cognition Arousal/Alertness: Awake/alert Behavior During Therapy: WFL for tasks assessed/performed Overall Cognitive Status: Within Functional Limits for tasks assessed                                          Exercises      General Comments General comments (skin integrity, edema, etc.): VSS on RA      Pertinent Vitals/Pain Pain Assessment Pain Assessment: Faces Faces Pain Scale: Hurts little more Pain Location: R knee Pain Descriptors / Indicators: Sore Pain Intervention(s): Monitored during session    Home Living                          Prior Function            PT Goals (current goals can now be found in the care plan section) Acute Rehab PT Goals Patient Stated Goal: to go home Progress towards PT goals: Progressing toward  goals    Frequency    7X/week      PT Plan Current plan remains appropriate    Co-evaluation              AM-PAC PT "6 Clicks" Mobility   Outcome Measure  Help needed turning from your back to your side while in a flat bed without using bedrails?: None Help needed moving from lying on your back to sitting on the side of a flat bed without using bedrails?: None Help needed moving to and from a bed to a chair (including a wheelchair)?: A Little Help needed standing up from a chair using your arms (e.g., wheelchair or bedside chair)?:  A Little Help needed to walk in hospital room?: A Little Help needed climbing 3-5 steps with a railing? : A Little 6 Click Score: 20    End of Session   Activity Tolerance: Patient tolerated treatment well Patient left: in bed;with call bell/phone within reach Nurse Communication: Mobility status PT Visit Diagnosis: Other abnormalities of gait and mobility (R26.89);Muscle weakness (generalized) (M62.81);Pain Pain - Right/Left: Right Pain - part of body: Knee     Time: 1610-9604 PT Time Calculation (min) (ACUTE ONLY): 17 min  Charges:    $Gait Training: 8-22 mins PT General Charges $$ ACUTE PT VISIT: 1 Visit                     Arlyss Gandy, PT, DPT Acute Rehabilitation Office [phone]    Arlyss Gandy 07/26/2022, 9:36 AM

## 2022-07-26 NOTE — Discharge Summary (Signed)
Patient ID: Paula Conway MRN: [id number] DOB/AGE: [birth year] 67 y.o.  Admit date: 07/25/2022 Discharge date: 07/26/2022  Admission Diagnoses:  Principal Problem:   Primary osteoarthritis of right knee Active Problems:   Status post total right knee replacement   Discharge Diagnoses:  Same  Past Medical History:  Diagnosis Date   Allergy    Arthritis    Complication of anesthesia    waking up during colonoscopy and difficulty waking up   Environmental and seasonal allergies 07/20/2015   Family history of adverse reaction to anesthesia    pt's mother and daughter have difficulty waking up   Family history of breast cancer in Mother 07/20/2015   Mother was diagnosed at 4 years old and died age 11 of breast cancer    GERD (gastroesophageal reflux disease)    Headache    History of kidney stones    Hyperlipidemia    Hypertension    stressed induced   Obesity 07/20/2015   Pre-diabetes    Spinal stenosis     Surgeries: Procedure(s): RIGHT TOTAL KNEE ARTHROPLASTY on 07/25/2022   Consultants:   Discharged Condition: Improved  Hospital Course: Paula Conway is an 67 y.o. female who was admitted 07/25/2022 for operative treatment ofPrimary osteoarthritis of right knee. Patient has severe unremitting pain that affects sleep, daily activities, and work/hobbies. After pre-op clearance the patient was taken to the operating room on 07/25/2022 and underwent  Procedure(s): RIGHT TOTAL KNEE ARTHROPLASTY.    Patient was given perioperative antibiotics:  Anti-infectives (From admission, onward)    Start     Dose/Rate Route Frequency Ordered Stop   07/25/22 1330  ceFAZolin (ANCEF) IVPB 2g/100 mL premix        2 g 200 mL/hr over 30 Minutes Intravenous Every 6 hours 07/25/22 1138 07/25/22 2222   07/25/22 0600  ceFAZolin (ANCEF) IVPB 2g/100 mL premix        2 g 200 mL/hr over 30 Minutes Intravenous On call to O.R. 07/25/22 0549 07/25/22 0759        Patient was given  sequential compression devices, early ambulation, and chemoprophylaxis to prevent DVT.  Patient benefited maximally from hospital stay and there were no complications.    Recent vital signs: Patient Vitals for the past 24 hrs:  BP Temp Temp src Pulse Resp SpO2  07/26/22 0434 124/61 98.5 F (36.9 C) Oral 69 20 97 %  07/25/22 2317 (!) 112/54 97.7 F (36.5 C) Oral (!) 57 20 99 %  07/25/22 1949 (!) 115/52 97.8 F (36.6 C) Oral (!) 58 18 99 %  07/25/22 1516 120/60 97.6 F (36.4 C) Oral 61 19 98 %  07/25/22 1151 (!) 109/55 97.8 F (36.6 C) -- (!) 58 20 98 %  07/25/22 1135 -- 98 F (36.7 C) -- -- -- --  07/25/22 1100 (!) 140/68 -- -- (!) 58 12 97 %  07/25/22 1045 126/70 -- -- (!) 52 11 97 %  07/25/22 1030 (!) 115/90 -- -- (!) 51 11 97 %  07/25/22 1015 117/69 -- -- (!) 53 (!) 9 96 %  07/25/22 1000 126/61 -- -- (!) 56 13 95 %  07/25/22 0945 (!) 102/91 -- -- (!) 57 13 91 %  07/25/22 0930 -- 98.1 F (36.7 C) -- -- 12 --     Recent laboratory studies:  Recent Labs    07/26/22 0012  WBC 11.3*  HGB 10.5*  HCT 32.0*  PLT 165     Discharge Medications:   Allergies as of  07/26/2022   No Known Allergies      Medication List     STOP taking these medications    acetaminophen 650 MG CR tablet Commonly known as: TYLENOL   naproxen sodium 220 MG tablet Commonly known as: ALEVE       TAKE these medications    aspirin EC 81 MG tablet Take 1 tablet (81 mg total) by mouth 2 (two) times daily. To be taken after surgery to prevent blood clots   Culturelle Digestive Health Caps Take 1 capsule by mouth daily.   diclofenac Sodium 1 % Gel Commonly known as: VOLTAREN Apply 1 Application topically 2 (two) times daily as needed (joint pain).   docusate sodium 100 MG capsule Commonly known as: Colace Take 1 capsule (100 mg total) by mouth daily as needed.   famotidine 20 MG tablet Commonly known as: PEPCID Take 1 tablet (20 mg total) by mouth at bedtime.   loratadine 10 MG  tablet Commonly known as: CLARITIN Take 10 mg by mouth every other day.   methocarbamol 750 MG tablet Commonly known as: Robaxin-750 Take 1 tablet (750 mg total) by mouth 2 (two) times daily as needed for muscle spasms.   multivitamin tablet Take 1 tablet by mouth daily.   omeprazole 20 MG capsule Commonly known as: PRILOSEC Take 20 mg by mouth daily.   ondansetron 4 MG tablet Commonly known as: Zofran Take 1 tablet (4 mg total) by mouth every 8 (eight) hours as needed for nausea or vomiting.   oxyCODONE-acetaminophen 5-325 MG tablet Commonly known as: Percocet Take 1-2 tablets by mouth every 6 (six) hours as needed. To be taken after surgery   pregabalin 75 MG capsule Commonly known as: LYRICA Take 1 capsule (75 mg total) by mouth 2 (two) times daily.   PROBIOTIC PO Take 1 tablet by mouth daily.   QC TUMERIC COMPLEX PO Take 1 tablet by mouth 2 (two) times a week.   Refresh Optive Mega-3 0.5-1-0.5 % Soln Generic drug: Carboxymeth-Glyc-Polysorb PF Place 1 drop into both eyes as needed (dry eyes).   rosuvastatin 5 MG tablet Commonly known as: CRESTOR Take 1 tablet (5 mg total) by mouth at bedtime.   vitamin C 1000 MG tablet Take 1,000 mg by mouth daily.   Vitamin D3 125 MCG (5000 UT) Caps Take 5,000 Units by mouth daily.               Durable Medical Equipment  (From admission, onward)           Start     Ordered   07/25/22 1146  DME Walker rolling  Once       Question Answer Comment  Walker: With 5 Inch Wheels   Patient needs a walker to treat with the following condition Status post left partial knee replacement      07/25/22 1145   07/25/22 1146  DME 3 n 1  Once        07/25/22 1145   07/25/22 1146  DME Bedside commode  Once       Question:  Patient needs a bedside commode to treat with the following condition  Answer:  Status post left partial knee replacement   07/25/22 1145            Diagnostic Studies: DG Knee Right Port  Result  Date: 07/25/2022 CLINICAL DATA:  67 year old female status post right knee arthroplasty. EXAM: PORTABLE RIGHT KNEE - 1-2 VIEW COMPARISON:  Knee series 04/13/2022. FINDINGS: Portable AP and  cross-table lateral views at 1034 hours. Right knee total arthroplasty hardware in place and aligned. Regional postoperative soft tissue gas, including some in the joint space. Postoperative changes to the undersurface of the patella. No unexpected osseous changes identified. IMPRESSION: Right knee total arthroplasty with no adverse features. Electronically Signed   By: Odessa Fleming M.D.   On: 07/25/2022 12:26   MM 3D SCREENING MAMMOGRAM BILATERAL BREAST  Result Date: 07/20/2022 CLINICAL DATA:  Screening. EXAM: DIGITAL SCREENING BILATERAL MAMMOGRAM WITH TOMOSYNTHESIS AND CAD TECHNIQUE: Bilateral screening digital craniocaudal and mediolateral oblique mammograms were obtained. Bilateral screening digital breast tomosynthesis was performed. The images were evaluated with computer-aided detection. COMPARISON:  Previous exam(s). ACR Breast Density Category a: The breasts are almost entirely fatty. FINDINGS: There are no findings suspicious for malignancy. IMPRESSION: No mammographic evidence of malignancy. A result letter of this screening mammogram will be mailed directly to the patient. RECOMMENDATION: Screening mammogram in one year. (Code:SM-B-01Y) BI-RADS CATEGORY  1: Negative. Electronically Signed   By: Edwin Cap M.D.   On: 07/20/2022 16:08    Disposition: Discharge disposition: 01-Home or Self Care          Follow-up Information     Cristie Hem, PA-C. Schedule an appointment as soon as possible for a visit in 2 week(s).   Specialty: Orthopedic Surgery Contact information: [address] [place] [address] [phone]         [facility] Follow up.   Why: [facility]--the home health agency will contact you for the first home visit. Contact information: [address]  [address] [place] [address] [phone]                  Signed: Cristie Hem 07/26/2022, 8:01 AM

## 2022-07-27 ENCOUNTER — Telehealth: Payer: Self-pay | Admitting: *Deleted

## 2022-07-27 DIAGNOSIS — E669 Obesity, unspecified: Secondary | ICD-10-CM | POA: Diagnosis not present

## 2022-07-27 DIAGNOSIS — Z7982 Long term (current) use of aspirin: Secondary | ICD-10-CM | POA: Diagnosis not present

## 2022-07-27 DIAGNOSIS — Z79891 Long term (current) use of opiate analgesic: Secondary | ICD-10-CM | POA: Diagnosis not present

## 2022-07-27 DIAGNOSIS — K219 Gastro-esophageal reflux disease without esophagitis: Secondary | ICD-10-CM | POA: Diagnosis not present

## 2022-07-27 DIAGNOSIS — Z471 Aftercare following joint replacement surgery: Secondary | ICD-10-CM | POA: Diagnosis not present

## 2022-07-27 DIAGNOSIS — Z96651 Presence of right artificial knee joint: Secondary | ICD-10-CM | POA: Diagnosis not present

## 2022-07-27 DIAGNOSIS — Z6833 Body mass index (BMI) 33.0-33.9, adult: Secondary | ICD-10-CM | POA: Diagnosis not present

## 2022-07-27 DIAGNOSIS — I10 Essential (primary) hypertension: Secondary | ICD-10-CM | POA: Diagnosis not present

## 2022-07-27 DIAGNOSIS — E785 Hyperlipidemia, unspecified: Secondary | ICD-10-CM | POA: Diagnosis not present

## 2022-07-27 NOTE — Telephone Encounter (Signed)
 Ortho bundle D/C call completed. 

## 2022-07-28 ENCOUNTER — Telehealth: Payer: Self-pay | Admitting: Orthopaedic Surgery

## 2022-07-28 DIAGNOSIS — Z6833 Body mass index (BMI) 33.0-33.9, adult: Secondary | ICD-10-CM | POA: Diagnosis not present

## 2022-07-28 DIAGNOSIS — Z471 Aftercare following joint replacement surgery: Secondary | ICD-10-CM | POA: Diagnosis not present

## 2022-07-28 DIAGNOSIS — Z79891 Long term (current) use of opiate analgesic: Secondary | ICD-10-CM | POA: Diagnosis not present

## 2022-07-28 DIAGNOSIS — K219 Gastro-esophageal reflux disease without esophagitis: Secondary | ICD-10-CM | POA: Diagnosis not present

## 2022-07-28 DIAGNOSIS — E785 Hyperlipidemia, unspecified: Secondary | ICD-10-CM | POA: Diagnosis not present

## 2022-07-28 DIAGNOSIS — Z7982 Long term (current) use of aspirin: Secondary | ICD-10-CM | POA: Diagnosis not present

## 2022-07-28 DIAGNOSIS — Z96651 Presence of right artificial knee joint: Secondary | ICD-10-CM | POA: Diagnosis not present

## 2022-07-28 DIAGNOSIS — E669 Obesity, unspecified: Secondary | ICD-10-CM | POA: Diagnosis not present

## 2022-07-28 DIAGNOSIS — I10 Essential (primary) hypertension: Secondary | ICD-10-CM | POA: Diagnosis not present

## 2022-07-28 NOTE — Telephone Encounter (Signed)
yes

## 2022-07-28 NOTE — Telephone Encounter (Signed)
Will From [facility] called requesting Verbal orders for OT 2 week 2, 1 week 1. CB number [phone] secure to leave VM

## 2022-07-29 NOTE — Telephone Encounter (Signed)
I called Paula Conway and advised.

## 2022-08-01 ENCOUNTER — Encounter: Payer: Self-pay | Admitting: Orthopaedic Surgery

## 2022-08-01 DIAGNOSIS — Z96651 Presence of right artificial knee joint: Secondary | ICD-10-CM | POA: Diagnosis not present

## 2022-08-01 DIAGNOSIS — K219 Gastro-esophageal reflux disease without esophagitis: Secondary | ICD-10-CM | POA: Diagnosis not present

## 2022-08-01 DIAGNOSIS — Z79891 Long term (current) use of opiate analgesic: Secondary | ICD-10-CM | POA: Diagnosis not present

## 2022-08-01 DIAGNOSIS — Z7982 Long term (current) use of aspirin: Secondary | ICD-10-CM | POA: Diagnosis not present

## 2022-08-01 DIAGNOSIS — I10 Essential (primary) hypertension: Secondary | ICD-10-CM | POA: Diagnosis not present

## 2022-08-01 DIAGNOSIS — Z6833 Body mass index (BMI) 33.0-33.9, adult: Secondary | ICD-10-CM | POA: Diagnosis not present

## 2022-08-01 DIAGNOSIS — E785 Hyperlipidemia, unspecified: Secondary | ICD-10-CM | POA: Diagnosis not present

## 2022-08-01 DIAGNOSIS — E669 Obesity, unspecified: Secondary | ICD-10-CM | POA: Diagnosis not present

## 2022-08-01 DIAGNOSIS — Z471 Aftercare following joint replacement surgery: Secondary | ICD-10-CM | POA: Diagnosis not present

## 2022-08-03 ENCOUNTER — Encounter: Payer: Self-pay | Admitting: Family Medicine

## 2022-08-03 ENCOUNTER — Telehealth (INDEPENDENT_AMBULATORY_CARE_PROVIDER_SITE_OTHER): Payer: Medicare PPO | Admitting: Family Medicine

## 2022-08-03 VITALS — BP 142/78 | HR 72 | Temp 97.8°F | Resp 18 | Ht 64.0 in | Wt 195.0 lb

## 2022-08-03 DIAGNOSIS — Z79891 Long term (current) use of opiate analgesic: Secondary | ICD-10-CM | POA: Diagnosis not present

## 2022-08-03 DIAGNOSIS — Z7982 Long term (current) use of aspirin: Secondary | ICD-10-CM | POA: Diagnosis not present

## 2022-08-03 DIAGNOSIS — I10 Essential (primary) hypertension: Secondary | ICD-10-CM | POA: Diagnosis not present

## 2022-08-03 DIAGNOSIS — Z6833 Body mass index (BMI) 33.0-33.9, adult: Secondary | ICD-10-CM | POA: Diagnosis not present

## 2022-08-03 DIAGNOSIS — Z471 Aftercare following joint replacement surgery: Secondary | ICD-10-CM | POA: Diagnosis not present

## 2022-08-03 DIAGNOSIS — K219 Gastro-esophageal reflux disease without esophagitis: Secondary | ICD-10-CM | POA: Diagnosis not present

## 2022-08-03 DIAGNOSIS — E669 Obesity, unspecified: Secondary | ICD-10-CM | POA: Diagnosis not present

## 2022-08-03 DIAGNOSIS — Z96651 Presence of right artificial knee joint: Secondary | ICD-10-CM | POA: Diagnosis not present

## 2022-08-03 DIAGNOSIS — E785 Hyperlipidemia, unspecified: Secondary | ICD-10-CM | POA: Diagnosis not present

## 2022-08-03 NOTE — Patient Instructions (Signed)
TRIGGERING FOODS: Fruits Any fruits prepared with added fat. Any fruits that cause symptoms. For some people this may include citrus fruits, such as oranges, grapefruit, pineapple, and lemons. Vegetables Deep-fried vegetables. [place] fries. Any vegetables prepared with added fat. Any vegetables that cause symptoms. For some people, this may include tomatoes and tomato products, chili peppers, onions and garlic, and horseradish. Grains Pastries or quick breads with added fat. Meats and other proteins High-fat meats, such as fatty beef or pork, hot dogs, ribs, ham, sausage, salami, and bacon. Fried meat or protein, including fried fish and fried chicken. Nuts and nut butters, in large amounts. Dairy Whole milk and chocolate milk. Sour cream. Cream. Ice cream. Cream cheese. Milkshakes. Fats and oils Butter. Margarine. Shortening. Ghee. Beverages Coffee and tea, with or without caffeine. Carbonated beverages. Sodas. Energy drinks. Fruit juice made with acidic fruits, such as orange or grapefruit. Tomato juice. Alcoholic drinks. Sweets and desserts Chocolate and cocoa. Donuts. Seasonings and condiments Pepper. Peppermint and spearmint. Added salt. Any condiments, herbs, or seasonings that cause symptoms. For some people, this may include curry, hot sauce, or vinegar-based salad dressings.

## 2022-08-03 NOTE — Assessment & Plan Note (Signed)
Given that patient had to discontinue her medication routine for surgery, extending trial for an additional 8 weeks.  Continue omeprazole 20 mg daily in the morning, famotidine 20 mg daily at bedtime.  We also discussed that certain foods can be more triggering than others, I provided a list of those to be mindful of.  If they are bothersome, they should be avoided.  If GERD symptoms still refractory after 8 weeks recommend referral to gastroenterology and possible endoscopy.

## 2022-08-03 NOTE — Progress Notes (Signed)
Virtual Visit via Video Note  I connected with Paula Conway on 08/03/22 at  1:10 PM EDT by a video enabled telemedicine application and verified that I am speaking with the correct person using two identifiers.  Patient Location: Home Provider Location: Office/Clinic  I discussed the limitations, risks, security, and privacy concerns of performing an evaluation and management service by video and the availability of in person appointments. I also discussed with the patient that there may be a patient responsible charge related to this service. The patient expressed understanding and agreed to proceed.    Subjective   Patient ID: Paula Conway, female    DOB: May 27, 1955  Age: 67 y.o. MRN: [id number]  Chief Complaint  Patient presents with   Follow Up Genella Rife    HPI Paula Conway is a 67 y.o. female presenting today for follow up of GERD. GERD: Patient currently taking omeprazole in the morning and famotidine before bed, is not having side effects. She reports good compliance with treatment; however, she did have to get off of her medication routine for short period of time to undergo knee surgery.  She is now back on the routine of omeprazole in the morning and famotidine before bed consistently.  She still has occasional episodes of heartburn which she believes may be due to eating too late or having triggering foods, but otherwise believes that the new medicine may be helping.  Denies dysphagia, nausea/vomiting, diarrhea, constipation, abdominal pain, chest pain, cough, early satiety, hematochezia/melena.  ROS Negative unless otherwise noted in HPI  Outpatient Medications Prior to Visit  Medication Sig   Ascorbic Acid (VITAMIN C) 1000 MG tablet Take 1,000 mg by mouth daily.   aspirin EC 81 MG tablet Take 1 tablet (81 mg total) by mouth 2 (two) times daily. To be taken after surgery to prevent blood clots   Carboxymeth-Glyc-Polysorb PF (REFRESH OPTIVE MEGA-3) 0.5-1-0.5 % SOLN  Place 1 drop into both eyes as needed (dry eyes).   Cholecalciferol (VITAMIN D3) 5000 units CAPS Take 5,000 Units by mouth daily.   diclofenac Sodium (VOLTAREN) 1 % GEL Apply 1 Application topically 2 (two) times daily as needed (joint pain).   docusate sodium (COLACE) 100 MG capsule Take 1 capsule (100 mg total) by mouth daily as needed.   famotidine (PEPCID) 20 MG tablet Take 1 tablet (20 mg total) by mouth at bedtime.   Lactobacillus-Inulin (CULTURELLE DIGESTIVE HEALTH) CAPS Take 1 capsule by mouth daily.   loratadine (CLARITIN) 10 MG tablet Take 10 mg by mouth every other day.   methocarbamol (ROBAXIN-750) 750 MG tablet Take 1 tablet (750 mg total) by mouth 2 (two) times daily as needed for muscle spasms.   Multiple Vitamin (MULTIVITAMIN) tablet Take 1 tablet by mouth daily.   omeprazole (PRILOSEC) 20 MG capsule Take 20 mg by mouth daily.   ondansetron (ZOFRAN) 4 MG tablet Take 1 tablet (4 mg total) by mouth every 8 (eight) hours as needed for nausea or vomiting.   oxyCODONE-acetaminophen (PERCOCET) 5-325 MG tablet Take 1-2 tablets by mouth every 6 (six) hours as needed. To be taken after surgery   pregabalin (LYRICA) 75 MG capsule Take 1 capsule (75 mg total) by mouth 2 (two) times daily.   Probiotic Product (PROBIOTIC PO) Take 1 tablet by mouth daily.   rosuvastatin (CRESTOR) 5 MG tablet Take 1 tablet (5 mg total) by mouth at bedtime.   Turmeric (QC TUMERIC COMPLEX PO) Take 1 tablet by mouth 2 (two) times a week.  No facility-administered medications prior to visit.     Objective:     Physical Exam General: Speaking clearly in complete sentences without any shortness of breath.  Alert and oriented x3.  Normal judgment. No apparent acute distress.   Assessment & Plan:  Gastroesophageal reflux disease, unspecified whether esophagitis present Assessment & Plan: Given that patient had to discontinue her medication routine for surgery, extending trial for an additional 8 weeks.   Continue omeprazole 20 mg daily in the morning, famotidine 20 mg daily at bedtime.  We also discussed that certain foods can be more triggering than others, I provided a list of those to be mindful of.  If they are bothersome, they should be avoided.  If GERD symptoms still refractory after 8 weeks recommend referral to gastroenterology and possible endoscopy.     Return in about 2 months (around 10/04/2022) for follow-up for GERD after adding famotidine, in person or video.   I discussed the assessment and treatment plan with the patient. The patient was provided an opportunity to ask questions, and all were answered. The patient agreed with the plan and demonstrated an understanding of the instructions.   The patient was advised to call back or seek an in-person evaluation if the symptoms worsen or if the condition fails to improve as anticipated.  The above assessment and management plan was discussed with the patient. The patient verbalized understanding of and has agreed to the management plan.   [name], PA

## 2022-08-05 DIAGNOSIS — I10 Essential (primary) hypertension: Secondary | ICD-10-CM | POA: Diagnosis not present

## 2022-08-05 DIAGNOSIS — E669 Obesity, unspecified: Secondary | ICD-10-CM | POA: Diagnosis not present

## 2022-08-05 DIAGNOSIS — Z471 Aftercare following joint replacement surgery: Secondary | ICD-10-CM | POA: Diagnosis not present

## 2022-08-05 DIAGNOSIS — Z6833 Body mass index (BMI) 33.0-33.9, adult: Secondary | ICD-10-CM | POA: Diagnosis not present

## 2022-08-05 DIAGNOSIS — Z7982 Long term (current) use of aspirin: Secondary | ICD-10-CM | POA: Diagnosis not present

## 2022-08-05 DIAGNOSIS — Z79891 Long term (current) use of opiate analgesic: Secondary | ICD-10-CM | POA: Diagnosis not present

## 2022-08-05 DIAGNOSIS — Z96651 Presence of right artificial knee joint: Secondary | ICD-10-CM | POA: Diagnosis not present

## 2022-08-05 DIAGNOSIS — E785 Hyperlipidemia, unspecified: Secondary | ICD-10-CM | POA: Diagnosis not present

## 2022-08-05 DIAGNOSIS — K219 Gastro-esophageal reflux disease without esophagitis: Secondary | ICD-10-CM | POA: Diagnosis not present

## 2022-08-09 ENCOUNTER — Ambulatory Visit: Payer: Medicare PPO | Admitting: Physical Therapy

## 2022-08-09 ENCOUNTER — Encounter: Payer: Self-pay | Admitting: Physical Therapy

## 2022-08-09 ENCOUNTER — Telehealth: Payer: Self-pay | Admitting: *Deleted

## 2022-08-09 ENCOUNTER — Other Ambulatory Visit: Payer: Self-pay

## 2022-08-09 ENCOUNTER — Ambulatory Visit: Payer: Medicare PPO | Admitting: Physician Assistant

## 2022-08-09 ENCOUNTER — Encounter: Payer: Self-pay | Admitting: Physician Assistant

## 2022-08-09 DIAGNOSIS — M6281 Muscle weakness (generalized): Secondary | ICD-10-CM

## 2022-08-09 DIAGNOSIS — M25651 Stiffness of right hip, not elsewhere classified: Secondary | ICD-10-CM | POA: Diagnosis not present

## 2022-08-09 DIAGNOSIS — R600 Localized edema: Secondary | ICD-10-CM | POA: Diagnosis not present

## 2022-08-09 DIAGNOSIS — M25561 Pain in right knee: Secondary | ICD-10-CM | POA: Diagnosis not present

## 2022-08-09 DIAGNOSIS — Z96651 Presence of right artificial knee joint: Secondary | ICD-10-CM

## 2022-08-09 DIAGNOSIS — R262 Difficulty in walking, not elsewhere classified: Secondary | ICD-10-CM | POA: Diagnosis not present

## 2022-08-09 NOTE — Therapy (Addendum)
OUTPATIENT PHYSICAL THERAPY LOWER EXTREMITY EVALUATION Referring diagnosis? M17.11 (ICD-10-CM) - Primary osteoarthritis of right knee Treatment diagnosis? (if different than referring diagnosis) m25.561 What was this (referring dx) caused by? [x]  Surgery []  Fall []  Ongoing issue [x]  Arthritis []  Other: ____________  Laterality: [x]  Rt []  Lt []  Both  Check all possible CPT codes:  *CHOOSE 10 OR LESS*    [x]  97110 (Therapeutic Exercise)  []  92507 (SLP Treatment)  [x]  97112 (Neuro Re-ed)   []  92526 (Swallowing Treatment)   [x]  97116 (Gait Training)   []  [phone] (Cognitive Training, 1st 15 minutes) [x]  97140 (Manual Therapy)   []  97130 (Cognitive Training, each add'l 15 minutes)  []  97164 (Re-evaluation)                              []  Other, List CPT Code ____________  [x]  97530 (Therapeutic Activities)     [x]  97535 (Self Care)   []  All codes above (97110 - 97535)  []  97012 (Mechanical Traction)  [x]  97014 (E-stim Unattended)  []  97032 (E-stim manual)  []  97033 (Ionto)  []  97035 (Ultrasound) []  97750 (Physical Performance Training) [x]  [id number] (Aquatic Therapy) []  97016 (Vasopneumatic Device) []  [id number] (Paraffin) []  97034 (Contrast Bath) []  97597 (Wound Care 1st 20 sq cm) []  97598 (Wound Care each add'l 20 sq cm) []  97760 (Orthotic Fabrication, Fitting, Training Initial) []  [id number] (Prosthetic Management and Training Initial) []  [id number] (Orthotic or Prosthetic Training/ Modification Subsequent)   Patient Name: Paula Conway MRN: [id number] DOB:[birth year], 67 y.o., female Today's Date: 08/09/2022  END OF SESSION:  PT End of Session - 08/09/22 1401     Visit Number 1    Number of Visits 16    Date for PT Re-Evaluation 09/20/22    Authorization Type [employer]    PT Start Time 1304    PT Stop Time 1355    PT Time Calculation (min) 51 min    Activity Tolerance Patient tolerated treatment well    Behavior During Therapy WFL for tasks assessed/performed             Past  Medical History:  Diagnosis Date   Allergy    Arthritis    Complication of anesthesia    waking up during colonoscopy and difficulty waking up   Environmental and seasonal allergies 07/20/2015   Family history of adverse reaction to anesthesia    pt's mother and daughter have difficulty waking up   Family history of breast cancer in Mother 07/20/2015   Mother was diagnosed at 79 years old and died age 33 of breast cancer    GERD (gastroesophageal reflux disease)    Headache    History of kidney stones    Hyperlipidemia    Hypertension    stressed induced   Obesity 07/20/2015   Pre-diabetes    Spinal stenosis    Past Surgical History:  Procedure Laterality Date   APPENDECTOMY     BREAST BIOPSY Right    COLONOSCOPY     core biopsy     TOTAL KNEE ARTHROPLASTY Right 07/25/2022   Procedure: RIGHT TOTAL KNEE ARTHROPLASTY;  Surgeon: Tarry Kos, MD;  Location: [facility] OR;  Service: Orthopedics;  Laterality: Right;   VAGINA SURGERY     after having a baby   Patient Active Problem List   Diagnosis Date Noted   Status post total right knee replacement 07/25/2022   Primary osteoarthritis of right knee 07/24/2022   Dysuria 07/05/2022  Bilateral primary osteoarthritis of knee 08/12/2021   Chronic pain of right knee 08/05/2020   Statin declined 11/16/2018   Grief reaction- loss of husband [name] Aug 8th, 2020 10/02/2018   Dysfunction of both eustachian tubes 07/24/2017   BPPV (benign paroxysmal positional vertigo), unspecified laterality 07/24/2017   Vitamin D insufficiency 03/15/2017   Lumbar discogenic pain syndrome 02/15/2017   Radiculopathy, lumbar region 02/15/2017   Arthritis of facet joints at multiple vertebral levels 02/15/2017   Low back pain 02/15/2017   Obesity, Class I, BMI 30-34.9 08/30/2016   Prediabetes 07/30/2015   Elevated LDL cholesterol level 07/30/2015   Family history of breast cancer in Mother 07/20/2015   GERD (gastroesophageal reflux disease) 07/20/2015    Generalized OA 07/20/2015   Hyperlipidemia 07/20/2015   Environmental and seasonal allergies 07/20/2015   Spinal stenosis: Chronic back pain  07/20/2015   Neoplasm of connective tissue 11/23/2011    PCP: [name], PA   REFERRING PROVIDER: Tarry Kos, MD  REFERRING DIAG: M17.11 (ICD-10-CM) - Primary osteoarthritis of right knee Z96.651 (ICD-10-CM) - Status post total right knee replacement  THERAPY DIAG:  Acute pain of right knee  Stiffness of right hip, not elsewhere classified  Muscle weakness (generalized)  Difficulty in walking, not elsewhere classified  Localized edema  Rationale for Evaluation and Treatment: Rehabilitation  ONSET DATE: Rt TKA 07/25/22   SUBJECTIVE:   SUBJECTIVE STATEMENT: She had HHPT until Friday, she uses CPM machine at home. Her pain has been managed with pain meds and she took a pain pill before coming so not much pain at the moment.   PERTINENT HISTORY: PMH includes Rt TKA,OA, GERD, HLD, HTN, spinal stenosis. PAIN:  NPRS scale: "not much" but took pain meds/10 upon arrival Pain location:front of Rt knee Pain description: throb that can be constant, shooting pain Aggravating factors: some of the exercises, bending her knee.  Relieving factors: rest, meds   PRECAUTIONS: None  RED FLAGS: None   WEIGHT BEARING RESTRICTIONS: No  FALLS:  Has patient fallen in last 6 months? No  LIVING ENVIRONMENT: 15 steps with handrail on left  OCCUPATION: works part time sitting work parking   PLOF: Independent  PATIENT GOALS: be able to drive before 6 weeks.   NEXT MD VISIT: 09/06/22  OBJECTIVE:   DIAGNOSTIC FINDINGS:   PATIENT SURVEYS:  FOTO 43% functional intake, goal is 55%  COGNITION: Overall cognitive status: Within functional limits for tasks assessed    EDEMA:  Moderate edema noted Rt knee at eval  LOWER EXTREMITY ROM:  Active ROM/PROM Right eval Left eval  Hip flexion    Hip extension    Hip abduction     Hip adduction    Hip internal rotation    Hip external rotation    Knee flexion A:90 P:95   Knee extension A:4   Ankle dorsiflexion    Ankle plantarflexion    Ankle inversion    Ankle eversion     (Blank rows = not tested)  LOWER EXTREMITY MMT:  MMT Right eval Left eval  Hip flexion 4   Hip extension    Hip abduction 4   Hip adduction    Hip internal rotation    Hip external rotation    Knee flexion 4   Knee extension 4   Ankle dorsiflexion    Ankle plantarflexion    Ankle inversion    Ankle eversion     (Blank rows = not tested)   FUNCTIONAL TESTS:  Timed up and  go (TUG): 10:25 without AD  GAIT: Eval Comments: ambulates in with RW, but able to walk 25 feet X 2 without AD with loose supervision.    TODAY'S TREATMENT:  Eval Therex HEP creation and review with demonstration and trial set preformed, see below for details Nu step L6 X 6 min UE/LE  Manual therapy for Rt knee PROM into flexion to tolerance  Vasopnuematic device X 10 min, medium compression, 34 deg to Rt knee     PATIENT EDUCATION: Education details: HEP, PT plan of care Person educated: Patient Education method: Explanation, Demonstration, Verbal cues, and Handouts Education comprehension: verbalized understanding and needs further education   HOME EXERCISE PROGRAM: Access Code: [email or web address] URL: [email or web address] Date: 08/09/2022 Prepared by: Ivery Quale  Exercises - Seated Knee Flexion Stretch  - 2 x daily - 6 x weekly - 1-2 sets - 10 reps - 5 sec hold - Seated Straight Leg Raise with Quad Contraction  - 2 x daily - 6 x weekly - 3 sets - 10 reps - Sit to Stand  - 2 x daily - 6 x weekly - 1-2 sets - 10 reps - Standing Hip Abduction with Resistance at Ankles and Counter Support  - 2 x daily - 6 x weekly - 2 sets - 10 reps - Mini Squat with Counter Support  - 2 x daily - 6 x weekly - 1-2 sets - 10 reps  ASSESSMENT:  CLINICAL IMPRESSION: Patient referred to PT S/P Rt  TKA 07/25/22. She is doing really well overall up to this point. Patient will benefit from skilled PT to address below impairments, limitations and improve overall function.  OBJECTIVE IMPAIRMENTS: decreased activity tolerance, difficulty walking, decreased balance, decreased endurance, decreased mobility, decreased ROM, decreased strength, impaired flexibility, impaired LE use, and pain.  ACTIVITY LIMITATIONS: bending, lifting, carry, locomotion, cleaning, community activity, driving, and or occupation  PERSONAL FACTORS: PMH includes Rt TKA,OA, GERD, HLD, HTN, spinal stenosis. are also affecting patient's functional outcome.  REHAB POTENTIAL: Good  CLINICAL DECISION MAKING: Stable/uncomplicated  EVALUATION COMPLEXITY: Low    GOALS: Short term PT Goals Target date: 09/06/2022   Pt will be I and compliant with HEP. Baseline:  Goal status: New   Long term PT goals Target date:11/01/2022  Pt will improve knee ROM to [facility] 0-110 to improve functional mobility Baseline: Goal status: New Pt will improve  hip/knee strength to at least 5-/5 MMT to improve functional strength Baseline: Goal status: New Pt will improve FOTO to at least 55% functional to show improved function Baseline: Goal status: New Pt will reduce pain to overall less than 2-3/10 with usual activity and work activity. Baseline: Goal status: New Pt will be able to ambulate community distances at least 500 ft WNL gait pattern without complaints Baseline: Goal status: New 6. Pt will improve TUG time to less than 9 seconds to show improved gait speed and sit to stand ability Goal status: NEW  PLAN: PT FREQUENCY: 1-3 times per week   PT DURATION: 6 weeks  PLANNED INTERVENTIONS (unless contraindicated): aquatic PT, Canalith repositioning, cryotherapy, Electrical stimulation, Iontophoresis with 4 mg/ml dexamethasome, Moist heat, traction, Ultrasound, gait training, Therapeutic exercise, balance training, neuromuscular  re-education, patient/family education, prosthetic training, manual techniques, passive ROM, dry needling, taping, vasopnuematic device, vestibular, spinal manipulations, joint manipulations  PLAN FOR NEXT SESSION: review HEP, knee ROM and quad strength exercises as tolerated, dynamic gait without AD, review stairs, vaso to end if desired.     April Manson, PT,DPT 08/09/2022, 2:02  PM

## 2022-08-09 NOTE — Progress Notes (Signed)
Post-Op Visit Note   Patient: Paula Conway           Date of Birth: [birth year]           MRN: [id number] Visit Date: 08/09/2022 PCP: [name], PA   Assessment & Plan:  Chief Complaint:  Chief Complaint  Patient presents with   Right Knee - Follow-up    Right total knee arthroplasty 07/25/2022   Visit Diagnoses:  1. Status post total right knee replacement     Plan: Patient is a pleasant 67 year old female who comes in today 2 weeks status post right total knee replacement 07/25/2022.  She has been doing well.  She has been getting home health physical therapy and is ambulating with a walker.  She has compliant taking a baby aspirin twice daily.  She is taking Percocet as needed.  She recently was relatively constipated but took mag citrate which alleviated the symptoms.  Overall, doing well.  Examination of her right knee reveals a well-healing surgical incision with nylon sutures in place.  No evidence of infection or cellulitis.  Calves are soft nontender.  She is neurovascular intact distally.  Today, sutures were removed and Steri-Strips applied.  She is scheduled to start outpatient physical therapy this afternoon.  She will continue with a baby aspirin twice daily for DVT prophylaxis.  Postop instructions provided.  Follow-up in 4 weeks for repeat evaluation and 2 view x-rays of right knee.  Call with concerns or questions.  Follow-Up Instructions: Return in about 4 weeks (around 09/06/2022).   Orders:  No orders of the defined types were placed in this encounter.  No orders of the defined types were placed in this encounter.   Imaging: No new imaging  PMFS History: Patient Active Problem List   Diagnosis Date Noted   Status post total right knee replacement 07/25/2022   Primary osteoarthritis of right knee 07/24/2022   Dysuria 07/05/2022   Bilateral primary osteoarthritis of knee 08/12/2021   Chronic pain of right knee 08/05/2020   Statin declined  11/16/2018   Grief reaction- loss of husband [name] Aug 8th, 2020 10/02/2018   Dysfunction of both eustachian tubes 07/24/2017   BPPV (benign paroxysmal positional vertigo), unspecified laterality 07/24/2017   Vitamin D insufficiency 03/15/2017   Lumbar discogenic pain syndrome 02/15/2017   Radiculopathy, lumbar region 02/15/2017   Arthritis of facet joints at multiple vertebral levels 02/15/2017   Low back pain 02/15/2017   Obesity, Class I, BMI 30-34.9 08/30/2016   Prediabetes 07/30/2015   Elevated LDL cholesterol level 07/30/2015   Family history of breast cancer in Mother 07/20/2015   GERD (gastroesophageal reflux disease) 07/20/2015   Generalized OA 07/20/2015   Hyperlipidemia 07/20/2015   Environmental and seasonal allergies 07/20/2015   Spinal stenosis: Chronic back pain  07/20/2015   Neoplasm of connective tissue 11/23/2011   Past Medical History:  Diagnosis Date   Allergy    Arthritis    Complication of anesthesia    waking up during colonoscopy and difficulty waking up   Environmental and seasonal allergies 07/20/2015   Family history of adverse reaction to anesthesia    pt's mother and daughter have difficulty waking up   Family history of breast cancer in Mother 07/20/2015   Mother was diagnosed at 39 years old and died age 65 of breast cancer    GERD (gastroesophageal reflux disease)    Headache    History of kidney stones    Hyperlipidemia    Hypertension  stressed induced   Obesity 07/20/2015   Pre-diabetes    Spinal stenosis     Family History  Problem Relation Age of Onset   Breast cancer Mother    Diabetes Father    Hypertension Father    Heart disease Father     Past Surgical History:  Procedure Laterality Date   APPENDECTOMY     BREAST BIOPSY Right    COLONOSCOPY     core biopsy     TOTAL KNEE ARTHROPLASTY Right 07/25/2022   Procedure: RIGHT TOTAL KNEE ARTHROPLASTY;  Surgeon: Tarry Kos, MD;  Location: [facility] OR;  Service: Orthopedics;   Laterality: Right;   VAGINA SURGERY     after having a baby   Social History   Occupational History   Not on file  Tobacco Use   Smoking status: Former    Current packs/day: 0.00    Average packs/day: 0.3 packs/day for 3.0 years (0.8 ttl pk-yrs)    Types: Cigarettes    Start date: 01/11/1972    Quit date: 01/11/1975    Years since quitting: 47.6    Passive exposure: Past   Smokeless tobacco: Never  Vaping Use   Vaping status: Never Used  Substance and Sexual Activity   Alcohol use: Yes    Comment: socially   Drug use: No   Sexual activity: Yes

## 2022-08-09 NOTE — Telephone Encounter (Signed)
Ortho bundle 14 day in office meeting completed. °

## 2022-08-10 DIAGNOSIS — Z96651 Presence of right artificial knee joint: Secondary | ICD-10-CM | POA: Diagnosis not present

## 2022-08-10 DIAGNOSIS — Z471 Aftercare following joint replacement surgery: Secondary | ICD-10-CM | POA: Diagnosis not present

## 2022-08-10 DIAGNOSIS — I10 Essential (primary) hypertension: Secondary | ICD-10-CM | POA: Diagnosis not present

## 2022-08-10 DIAGNOSIS — Z6833 Body mass index (BMI) 33.0-33.9, adult: Secondary | ICD-10-CM | POA: Diagnosis not present

## 2022-08-10 DIAGNOSIS — Z7982 Long term (current) use of aspirin: Secondary | ICD-10-CM | POA: Diagnosis not present

## 2022-08-10 DIAGNOSIS — E785 Hyperlipidemia, unspecified: Secondary | ICD-10-CM | POA: Diagnosis not present

## 2022-08-10 DIAGNOSIS — Z79891 Long term (current) use of opiate analgesic: Secondary | ICD-10-CM | POA: Diagnosis not present

## 2022-08-10 DIAGNOSIS — E669 Obesity, unspecified: Secondary | ICD-10-CM | POA: Diagnosis not present

## 2022-08-10 DIAGNOSIS — K219 Gastro-esophageal reflux disease without esophagitis: Secondary | ICD-10-CM | POA: Diagnosis not present

## 2022-08-11 ENCOUNTER — Ambulatory Visit: Payer: Medicare PPO | Admitting: Rehabilitative and Restorative Service Providers"

## 2022-08-11 ENCOUNTER — Encounter: Payer: Self-pay | Admitting: Rehabilitative and Restorative Service Providers"

## 2022-08-11 DIAGNOSIS — M25651 Stiffness of right hip, not elsewhere classified: Secondary | ICD-10-CM

## 2022-08-11 DIAGNOSIS — I10 Essential (primary) hypertension: Secondary | ICD-10-CM | POA: Diagnosis not present

## 2022-08-11 DIAGNOSIS — M6281 Muscle weakness (generalized): Secondary | ICD-10-CM

## 2022-08-11 DIAGNOSIS — M25561 Pain in right knee: Secondary | ICD-10-CM | POA: Diagnosis not present

## 2022-08-11 DIAGNOSIS — Z79891 Long term (current) use of opiate analgesic: Secondary | ICD-10-CM | POA: Diagnosis not present

## 2022-08-11 DIAGNOSIS — R262 Difficulty in walking, not elsewhere classified: Secondary | ICD-10-CM | POA: Diagnosis not present

## 2022-08-11 DIAGNOSIS — Z6833 Body mass index (BMI) 33.0-33.9, adult: Secondary | ICD-10-CM | POA: Diagnosis not present

## 2022-08-11 DIAGNOSIS — E785 Hyperlipidemia, unspecified: Secondary | ICD-10-CM | POA: Diagnosis not present

## 2022-08-11 DIAGNOSIS — Z471 Aftercare following joint replacement surgery: Secondary | ICD-10-CM | POA: Diagnosis not present

## 2022-08-11 DIAGNOSIS — K219 Gastro-esophageal reflux disease without esophagitis: Secondary | ICD-10-CM | POA: Diagnosis not present

## 2022-08-11 DIAGNOSIS — Z96651 Presence of right artificial knee joint: Secondary | ICD-10-CM | POA: Diagnosis not present

## 2022-08-11 DIAGNOSIS — Z7982 Long term (current) use of aspirin: Secondary | ICD-10-CM | POA: Diagnosis not present

## 2022-08-11 DIAGNOSIS — R600 Localized edema: Secondary | ICD-10-CM | POA: Diagnosis not present

## 2022-08-11 DIAGNOSIS — E669 Obesity, unspecified: Secondary | ICD-10-CM | POA: Diagnosis not present

## 2022-08-11 NOTE — Therapy (Signed)
OUTPATIENT PHYSICAL THERAPY LOWER EXTREMITY TREATMENT  Referring diagnosis? M17.11 (ICD-10-CM) - Primary osteoarthritis of right knee Treatment diagnosis? (if different than referring diagnosis) m25.561 What was this (referring dx) caused by? [x]  Surgery []  Fall []  Ongoing issue [x]  Arthritis []  Other: ____________  Laterality: [x]  Rt []  Lt []  Both  Check all possible CPT codes:  *CHOOSE 10 OR LESS*    [x]  97110 (Therapeutic Exercise)  []  92507 (SLP Treatment)  [x]  97112 (Neuro Re-ed)   []  92526 (Swallowing Treatment)   [x]  97116 (Gait Training)   []  [id number] (Cognitive Training, 1st 15 minutes) [x]  97140 (Manual Therapy)   []  97130 (Cognitive Training, each add'l 15 minutes)  []  97164 (Re-evaluation)                              []  Other, List CPT Code ____________  [x]  97530 (Therapeutic Activities)     [x]  97535 (Self Care)   []  All codes above (97110 - 97535)  []  97012 (Mechanical Traction)  [x]  97014 (E-stim Unattended)  []  97032 (E-stim manual)  []  97033 (Ionto)  []  95284 (Ultrasound) []  97750 (Physical Performance Training) []  [id number] (Aquatic Therapy) []  97016 (Vasopneumatic Device) []  [id number] (Paraffin) []  97034 (Contrast Bath) []  97597 (Wound Care 1st 20 sq cm) []  97598 (Wound Care each add'l 20 sq cm) []  97760 (Orthotic Fabrication, Fitting, Training Initial) []  [id number] (Prosthetic Management and Training Initial) []  [id number] (Orthotic or Prosthetic Training/ Modification Subsequent)   Patient Name: Paula Conway MRN: [id number] DOB:01/21/1955, 67 y.o., female Today's Date: 08/11/2022  END OF SESSION:  PT End of Session - 08/11/22 0845     Visit Number 2    Number of Visits 16    Date for PT Re-Evaluation 09/20/22    Authorization Type [employer]    Authorization - Visit Number 2    Progress Note Due on Visit 10    PT Start Time 0804    PT Stop Time 0855    PT Time Calculation (min) 51 min    Activity Tolerance Patient tolerated treatment well;Patient limited by  pain;No increased pain    Behavior During Therapy [facility] for tasks assessed/performed              Past Medical History:  Diagnosis Date   Allergy    Arthritis    Complication of anesthesia    waking up during colonoscopy and difficulty waking up   Environmental and seasonal allergies 07/20/2015   Family history of adverse reaction to anesthesia    pt's mother and daughter have difficulty waking up   Family history of breast cancer in Mother 07/20/2015   Mother was diagnosed at 71 years old and died age 63 of breast cancer    GERD (gastroesophageal reflux disease)    Headache    History of kidney stones    Hyperlipidemia    Hypertension    stressed induced   Obesity 07/20/2015   Pre-diabetes    Spinal stenosis    Past Surgical History:  Procedure Laterality Date   APPENDECTOMY     BREAST BIOPSY Right    COLONOSCOPY     core biopsy     TOTAL KNEE ARTHROPLASTY Right 07/25/2022   Procedure: RIGHT TOTAL KNEE ARTHROPLASTY;  Surgeon: Tarry Kos, MD;  Location: [facility] OR;  Service: Orthopedics;  Laterality: Right;   VAGINA SURGERY     after having a baby   Patient Active Problem List  Diagnosis Date Noted   Status post total right knee replacement 07/25/2022   Primary osteoarthritis of right knee 07/24/2022   Dysuria 07/05/2022   Bilateral primary osteoarthritis of knee 08/12/2021   Chronic pain of right knee 08/05/2020   Statin declined 11/16/2018   Grief reaction- loss of husband [name] Aug 8th, 2020 10/02/2018   Dysfunction of both eustachian tubes 07/24/2017   BPPV (benign paroxysmal positional vertigo), unspecified laterality 07/24/2017   Vitamin D insufficiency 03/15/2017   Lumbar discogenic pain syndrome 02/15/2017   Radiculopathy, lumbar region 02/15/2017   Arthritis of facet joints at multiple vertebral levels 02/15/2017   Low back pain 02/15/2017   Obesity, Class I, BMI 30-34.9 08/30/2016   Prediabetes 07/30/2015   Elevated LDL cholesterol level 07/30/2015    Family history of breast cancer in Mother 07/20/2015   GERD (gastroesophageal reflux disease) 07/20/2015   Generalized OA 07/20/2015   Hyperlipidemia 07/20/2015   Environmental and seasonal allergies 07/20/2015   Spinal stenosis: Chronic back pain  07/20/2015   Neoplasm of connective tissue 11/23/2011    PCP: [name], PA   REFERRING PROVIDER: Tarry Kos, MD  REFERRING DIAG: M17.11 (ICD-10-CM) - Primary osteoarthritis of right knee Z96.651 (ICD-10-CM) - Status post total right knee replacement  THERAPY DIAG:  Acute pain of right knee  Stiffness of right hip, not elsewhere classified  Muscle weakness (generalized)  Difficulty in walking, not elsewhere classified  Localized edema  Rationale for Evaluation and Treatment: Rehabilitation  ONSET DATE: Rt TKA 07/25/22   SUBJECTIVE:   SUBJECTIVE STATEMENT: Kierah reports sleep 2-3 hours of uninterrupted sleep.  She reports good HEP compliance.  PERTINENT HISTORY: PMH includes Rt TKA,OA, GERD, HLD, HTN, spinal stenosis. PAIN:  NPRS scale: 0-5/10 the past 2 days Pain location: front of Rt knee Pain description: throb that can be constant, shooting pain Aggravating factors: some of the exercises, bending her knee.  Relieving factors: rest, meds   PRECAUTIONS: None  RED FLAGS: None   WEIGHT BEARING RESTRICTIONS: No  FALLS:  Has patient fallen in last 6 months? No  LIVING ENVIRONMENT: 15 steps with handrail on left  OCCUPATION: works part time sitting work parking   PLOF: Independent  PATIENT GOALS: be able to drive before 6 weeks.   NEXT MD VISIT: 09/06/22  OBJECTIVE:   DIAGNOSTIC FINDINGS:   PATIENT SURVEYS:  FOTO 43% functional intake, goal is 55%  COGNITION: Overall cognitive status: Within functional limits for tasks assessed    EDEMA:  Moderate edema noted Rt knee at eval  LOWER EXTREMITY ROM:  Active ROM/PROM Right eval Left eval  Hip flexion    Hip extension    Hip abduction     Hip adduction    Hip internal rotation    Hip external rotation    Knee flexion A:90 P:95   Knee extension A:4   Ankle dorsiflexion    Ankle plantarflexion    Ankle inversion    Ankle eversion     (Blank rows = not tested)  LOWER EXTREMITY MMT:  MMT Right eval Left eval  Hip flexion 4   Hip extension    Hip abduction 4   Hip adduction    Hip internal rotation    Hip external rotation    Knee flexion 4   Knee extension 4   Ankle dorsiflexion    Ankle plantarflexion    Ankle inversion    Ankle eversion     (Blank rows = not tested)   FUNCTIONAL TESTS:  Timed up and go (TUG): 10:25 without AD  GAIT: Eval Comments: ambulates in with RW, but able to walk 25 feet X 2 without AD with loose supervision.    TODAY'S TREATMENT:  08/11/2022 Recumbent bike Seat 7 for 5 minutes AAROM Tailgate knee flexion 1 minute Seated knee flexion 10 x 5 seconds Seated straight leg raises 3 sets of 5 bilateral Standing hip abduction with Thera-band resistance (red) 10 x bilateral  Functional Activities: Double Leg Press 75# 10 x slow eccentrics full extension to full flexion Single Leg Press 31# 10 x slow eccentrics full extension to full flexion  Vaso right knee 10 minutes medium 34*   Eval Therex HEP creation and review with demonstration and trial set preformed, see below for details Nu step L6 X 6 min UE/LE  Manual therapy for Rt knee PROM into flexion to tolerance  Vasopnuematic device X 10 min, medium compression, 34 deg to Rt knee   PATIENT EDUCATION: Education details: HEP, PT plan of care Person educated: Patient Education method: Explanation, Demonstration, Verbal cues, and Handouts Education comprehension: verbalized understanding and needs further education   HOME EXERCISE PROGRAM: Access Code: [email or web address] URL: [email or web address] Date: 08/09/2022 Prepared by: Ivery Quale  Exercises - Seated Knee Flexion Stretch  - 2 x daily - 6 x weekly -  1-2 sets - 10 reps - 5 sec hold - Seated Straight Leg Raise with Quad Contraction  - 2 x daily - 6 x weekly - 3 sets - 10 reps - Sit to Stand  - 2 x daily - 6 x weekly - 1-2 sets - 10 reps - Standing Hip Abduction with Resistance at Ankles and Counter Support  - 2 x daily - 6 x weekly - 2 sets - 10 reps - Mini Squat with Counter Support  - 2 x daily - 6 x weekly - 1-2 sets - 10 reps  ASSESSMENT:  CLINICAL IMPRESSION: Theresea reports early compliance with her HEP.  Flexion AROM, edema and quadriceps strength will be an early emphasis as extension AROM appears to be edema limited but good overall.  We reviewed her day 1 HEP with emphasis on non-weight bearing in the early going.  Continue POC.  OBJECTIVE IMPAIRMENTS: decreased activity tolerance, difficulty walking, decreased balance, decreased endurance, decreased mobility, decreased ROM, decreased strength, impaired flexibility, impaired LE use, and pain.  ACTIVITY LIMITATIONS: bending, lifting, carry, locomotion, cleaning, community activity, driving, and or occupation  PERSONAL FACTORS: PMH includes Rt TKA,OA, GERD, HLD, HTN, spinal stenosis. are also affecting patient's functional outcome.  REHAB POTENTIAL: Good  CLINICAL DECISION MAKING: Stable/uncomplicated  EVALUATION COMPLEXITY: Low    GOALS: Short term PT Goals Target date: 09/06/2022   Pt will be I and compliant with HEP. Baseline:  Goal status: On Going 08/11/2022   Long term PT goals Target date:11/01/2022  Pt will improve knee ROM to [facility] 0-110 to improve functional mobility Baseline: Goal status: New Pt will improve  hip/knee strength to at least 5-/5 MMT to improve functional strength Baseline: Goal status: New Pt will improve FOTO to at least 55% functional to show improved function Baseline: Goal status: New Pt will reduce pain to overall less than 2-3/10 with usual activity and work activity. Baseline: Goal status: New Pt will be able to ambulate community  distances at least 500 ft WNL gait pattern without complaints Baseline: Goal status: New 6. Pt will improve TUG time to less than 9 seconds to show improved gait speed and sit to stand ability Goal status:  NEW  PLAN: PT FREQUENCY: 1-3 times per week   PT DURATION: 6 weeks  PLANNED INTERVENTIONS (unless contraindicated): aquatic PT, Canalith repositioning, cryotherapy, Electrical stimulation, Iontophoresis with 4 mg/ml dexamethasome, Moist heat, traction, Ultrasound, gait training, Therapeutic exercise, balance training, neuromuscular re-education, patient/family education, prosthetic training, manual techniques, passive ROM, dry needling, taping, vasopnuematic device, vestibular, spinal manipulations, joint manipulations  PLAN FOR NEXT SESSION: Review HEP, knee ROM and quadriceps strength exercises as appropriate.  Progress to dynamic gait without AD, review stairs, vaso to end encouraged.    Cherlyn Cushing, PT, MPT 08/11/2022, 11:17 AM

## 2022-08-12 ENCOUNTER — Encounter: Payer: Self-pay | Admitting: Rehabilitative and Restorative Service Providers"

## 2022-08-12 ENCOUNTER — Ambulatory Visit: Payer: Medicare PPO | Admitting: Rehabilitative and Restorative Service Providers"

## 2022-08-12 DIAGNOSIS — M25561 Pain in right knee: Secondary | ICD-10-CM

## 2022-08-12 DIAGNOSIS — M6281 Muscle weakness (generalized): Secondary | ICD-10-CM

## 2022-08-12 DIAGNOSIS — R262 Difficulty in walking, not elsewhere classified: Secondary | ICD-10-CM

## 2022-08-12 DIAGNOSIS — R600 Localized edema: Secondary | ICD-10-CM

## 2022-08-12 DIAGNOSIS — M25651 Stiffness of right hip, not elsewhere classified: Secondary | ICD-10-CM

## 2022-08-12 NOTE — Therapy (Signed)
OUTPATIENT PHYSICAL THERAPY LOWER EXTREMITY TREATMENT  Referring diagnosis? M17.11 (ICD-10-CM) - Primary osteoarthritis of right knee Treatment diagnosis? (if different than referring diagnosis) m25.561 What was this (referring dx) caused by? [x]  Surgery []  Fall []  Ongoing issue [x]  Arthritis []  Other: ____________  Laterality: [x]  Rt []  Lt []  Both  Check all possible CPT codes:  *CHOOSE 10 OR LESS*    [x]  97110 (Therapeutic Exercise)  []  92507 (SLP Treatment)  [x]  97112 (Neuro Re-ed)   []  92526 (Swallowing Treatment)   [x]  97116 (Gait Training)   []  62952 (Cognitive Training, 1st 15 minutes) [x]  97140 (Manual Therapy)   []  97130 (Cognitive Training, each add'l 15 minutes)  []  97164 (Re-evaluation)                              []  Other, List CPT Code ____________  [x]  97530 (Therapeutic Activities)     [x]  97535 (Self Care)   []  All codes above (97110 - 97535)  []  97012 (Mechanical Traction)  [x]  97014 (E-stim Unattended)  []  97032 (E-stim manual)  []  97033 (Ionto)  []  97035 (Ultrasound) []  97750 (Physical Performance Training) []  [id number] (Aquatic Therapy) []  97016 (Vasopneumatic Device) []  [id number] (Paraffin) []  97034 (Contrast Bath) []  97597 (Wound Care 1st 20 sq cm) []  97598 (Wound Care each add'l 20 sq cm) []  97760 (Orthotic Fabrication, Fitting, Training Initial) []  [id number] (Prosthetic Management and Training Initial) []  [id number] (Orthotic or Prosthetic Training/ Modification Subsequent)   Patient Name: Paula Conway MRN: [id number] DOB:06-21-1955, 67 y.o., female Today's Date: 08/12/2022  END OF SESSION:  PT End of Session - 08/12/22 1406     Visit Number 3    Number of Visits 16    Date for PT Re-Evaluation 09/20/22    Authorization Type [employer]    Authorization - Visit Number 3    Progress Note Due on Visit 10    PT Start Time 1345    PT Stop Time 1435    PT Time Calculation (min) 50 min    Activity Tolerance Patient tolerated treatment well;No increased pain     Behavior During Therapy [facility] for tasks assessed/performed               Past Medical History:  Diagnosis Date   Allergy    Arthritis    Complication of anesthesia    waking up during colonoscopy and difficulty waking up   Environmental and seasonal allergies 07/20/2015   Family history of adverse reaction to anesthesia    pt's mother and daughter have difficulty waking up   Family history of breast cancer in Mother 07/20/2015   Mother was diagnosed at 29 years old and died age 35 of breast cancer    GERD (gastroesophageal reflux disease)    Headache    History of kidney stones    Hyperlipidemia    Hypertension    stressed induced   Obesity 07/20/2015   Pre-diabetes    Spinal stenosis    Past Surgical History:  Procedure Laterality Date   APPENDECTOMY     BREAST BIOPSY Right    COLONOSCOPY     core biopsy     TOTAL KNEE ARTHROPLASTY Right 07/25/2022   Procedure: RIGHT TOTAL KNEE ARTHROPLASTY;  Surgeon: Tarry Kos, MD;  Location: [facility] OR;  Service: Orthopedics;  Laterality: Right;   VAGINA SURGERY     after having a baby   Patient Active Problem List   Diagnosis Date  Noted   Status post total right knee replacement 07/25/2022   Primary osteoarthritis of right knee 07/24/2022   Dysuria 07/05/2022   Bilateral primary osteoarthritis of knee 08/12/2021   Chronic pain of right knee 08/05/2020   Statin declined 11/16/2018   Grief reaction- loss of husband [name] Aug 8th, 2020 10/02/2018   Dysfunction of both eustachian tubes 07/24/2017   BPPV (benign paroxysmal positional vertigo), unspecified laterality 07/24/2017   Vitamin D insufficiency 03/15/2017   Lumbar discogenic pain syndrome 02/15/2017   Radiculopathy, lumbar region 02/15/2017   Arthritis of facet joints at multiple vertebral levels 02/15/2017   Low back pain 02/15/2017   Obesity, Class I, BMI 30-34.9 08/30/2016   Prediabetes 07/30/2015   Elevated LDL cholesterol level 07/30/2015   Family history of  breast cancer in Mother 07/20/2015   GERD (gastroesophageal reflux disease) 07/20/2015   Generalized OA 07/20/2015   Hyperlipidemia 07/20/2015   Environmental and seasonal allergies 07/20/2015   Spinal stenosis: Chronic back pain  07/20/2015   Neoplasm of connective tissue 11/23/2011    PCP: [name], PA   REFERRING PROVIDER: Tarry Kos, MD  REFERRING DIAG: M17.11 (ICD-10-CM) - Primary osteoarthritis of right knee Z96.651 (ICD-10-CM) - Status post total right knee replacement  THERAPY DIAG:  Acute pain of right knee  Stiffness of right hip, not elsewhere classified  Muscle weakness (generalized)  Difficulty in walking, not elsewhere classified  Localized edema  Rationale for Evaluation and Treatment: Rehabilitation  ONSET DATE: Rt TKA 07/25/22   SUBJECTIVE:   SUBJECTIVE STATEMENT: Kailly reports a bit better uninterrupted sleep (4 hours, was 2-3 hours).  She reports continued good HEP compliance.  PERTINENT HISTORY: PMH includes Rt TKA,OA, GERD, HLD, HTN, spinal stenosis. PAIN:  NPRS scale: 0-5/10 the past 3 days Pain location: front of Rt knee Pain description: throb that can be constant, shooting pain Aggravating factors: some of the exercises, bending her knee.  Relieving factors: rest, meds   PRECAUTIONS: None  RED FLAGS: None   WEIGHT BEARING RESTRICTIONS: No  FALLS:  Has patient fallen in last 6 months? No  LIVING ENVIRONMENT: 15 steps with handrail on left  OCCUPATION: works part time sitting work parking   PLOF: Independent  PATIENT GOALS: be able to drive before 6 weeks.   NEXT MD VISIT: 09/06/22  OBJECTIVE:   DIAGNOSTIC FINDINGS:   PATIENT SURVEYS:  FOTO 43% functional intake, goal is 55%  COGNITION: Overall cognitive status: Within functional limits for tasks assessed    EDEMA:  Moderate edema noted Rt knee at eval  LOWER EXTREMITY ROM:  Active ROM/PROM Right eval Right 08/12/2022  Hip flexion    Hip extension     Hip abduction    Hip adduction    Hip internal rotation    Hip external rotation    Knee flexion A:90 P:95 107  Knee extension A:4 -2  Ankle dorsiflexion    Ankle plantarflexion    Ankle inversion    Ankle eversion     (Blank rows = not tested)  LOWER EXTREMITY MMT:  MMT Right eval Left eval  Hip flexion 4   Hip extension    Hip abduction 4   Hip adduction    Hip internal rotation    Hip external rotation    Knee flexion 4   Knee extension 4   Ankle dorsiflexion    Ankle plantarflexion    Ankle inversion    Ankle eversion     (Blank rows = not tested)  FUNCTIONAL TESTS:  Timed up and go (TUG): 10:25 without AD  GAIT: Eval Comments: ambulates in with RW, but able to walk 25 feet X 2 without AD with loose supervision.    TODAY'S TREATMENT:  08/12/2022 Recumbent bike Seat 7 for 10 minutes AAROM Tailgate knee flexion 1 minute Seated knee flexion 10 x 10 seconds Seated straight leg raises 3 sets of 5 bilateral Standing hip abduction with Thera-band resistance (red) 2 sets of 10 x bilateral  Functional Activities: Double Leg Press 75# 10 x slow eccentrics full extension to full flexion Single Leg Press 31# 10 x slow eccentrics full extension to full flexion  Vaso right knee 10 minutes medium 34*   08/11/2022 Recumbent bike Seat 7 for 5 minutes AAROM Tailgate knee flexion 1 minute Seated knee flexion 10 x 5 seconds Seated straight leg raises 3 sets of 5 bilateral Standing hip abduction with Thera-band resistance (red) 10 x bilateral  Functional Activities: Double Leg Press 75# 10 x slow eccentrics full extension to full flexion Single Leg Press 31# 10 x slow eccentrics full extension to full flexion  Vaso right knee 10 minutes medium 34*   Eval Therex HEP creation and review with demonstration and trial set preformed, see below for details Nu step L6 X 6 min UE/LE  Manual therapy for Rt knee PROM into flexion to tolerance  Vasopnuematic device X 10  min, medium compression, 34 deg to Rt knee   PATIENT EDUCATION: Education details: HEP, PT plan of care Person educated: Patient Education method: Explanation, Demonstration, Verbal cues, and Handouts Education comprehension: verbalized understanding and needs further education   HOME EXERCISE PROGRAM: Access Code: [email or web address] URL: [email or web address] Date: 08/09/2022 Prepared by: Ivery Quale  Exercises - Seated Knee Flexion Stretch  - 2 x daily - 6 x weekly - 1-2 sets - 10 reps - 5 sec hold - Seated Straight Leg Raise with Quad Contraction  - 2 x daily - 6 x weekly - 3 sets - 10 reps - Sit to Stand  - 2 x daily - 6 x weekly - 1-2 sets - 10 reps - Standing Hip Abduction with Resistance at Ankles and Counter Support  - 2 x daily - 6 x weekly - 2 sets - 10 reps - Mini Squat with Counter Support  - 2 x daily - 6 x weekly - 1-2 sets - 10 reps  ASSESSMENT:  CLINICAL IMPRESSION: Raynetta has been doing a good job with her home exercises.  AROM is making good early progress (2 - 0 - 107, was 4 - 0 - 90) and will benefit from continued work on flexion AROM, edema and quadriceps strength.  Emphasis is partial and non-weight bearing strengthening and edema control next week.  Continue POC.  OBJECTIVE IMPAIRMENTS: decreased activity tolerance, difficulty walking, decreased balance, decreased endurance, decreased mobility, decreased ROM, decreased strength, impaired flexibility, impaired LE use, and pain.  ACTIVITY LIMITATIONS: bending, lifting, carry, locomotion, cleaning, community activity, driving, and or occupation  PERSONAL FACTORS: PMH includes Rt TKA,OA, GERD, HLD, HTN, spinal stenosis. are also affecting patient's functional outcome.  REHAB POTENTIAL: Good  CLINICAL DECISION MAKING: Stable/uncomplicated  EVALUATION COMPLEXITY: Low    GOALS: Short term PT Goals Target date: 09/06/2022   Pt will be I and compliant with HEP. Baseline:  Goal status: Met  08/12/2022   Long term PT goals Target date:11/01/2022  Pt will improve knee ROM to [facility] 0-110 to improve functional mobility Baseline: Goal status: On Going 08/12/2022 Pt will improve  hip/knee strength to at least 5-/5 MMT to improve functional strength Baseline: Goal status: New Pt will improve FOTO to at least 55% functional to show improved function Baseline: Goal status: New Pt will reduce pain to overall less than 2-3/10 with usual activity and work activity. Baseline: Goal status: New Pt will be able to ambulate community distances at least 500 ft WNL gait pattern without complaints Baseline: Goal status: New 6. Pt will improve TUG time to less than 9 seconds to show improved gait speed and sit to stand ability Goal status: NEW  PLAN: PT FREQUENCY: 1-3 times per week   PT DURATION: 6 weeks  PLANNED INTERVENTIONS (unless contraindicated): aquatic PT, Canalith repositioning, cryotherapy, Electrical stimulation, Iontophoresis with 4 mg/ml dexamethasome, Moist heat, traction, Ultrasound, gait training, Therapeutic exercise, balance training, neuromuscular re-education, patient/family education, prosthetic training, manual techniques, passive ROM, dry needling, taping, vasopnuematic device, vestibular, spinal manipulations, joint manipulations  PLAN FOR NEXT SESSION: Edema control and quadriceps strength exercises as appropriate.  Progress to dynamic gait without AD, review stairs, vaso to end encouraged.    Cherlyn Cushing, PT, MPT 08/12/2022, 2:31 PM

## 2022-08-16 ENCOUNTER — Encounter: Payer: Self-pay | Admitting: Orthopaedic Surgery

## 2022-08-16 ENCOUNTER — Encounter: Payer: Self-pay | Admitting: Physical Therapy

## 2022-08-16 ENCOUNTER — Other Ambulatory Visit: Payer: Self-pay | Admitting: Physician Assistant

## 2022-08-16 ENCOUNTER — Telehealth: Payer: Self-pay | Admitting: Orthopaedic Surgery

## 2022-08-16 ENCOUNTER — Ambulatory Visit: Payer: Medicare PPO | Admitting: Physical Therapy

## 2022-08-16 DIAGNOSIS — R600 Localized edema: Secondary | ICD-10-CM

## 2022-08-16 DIAGNOSIS — M25651 Stiffness of right hip, not elsewhere classified: Secondary | ICD-10-CM | POA: Diagnosis not present

## 2022-08-16 DIAGNOSIS — R262 Difficulty in walking, not elsewhere classified: Secondary | ICD-10-CM | POA: Diagnosis not present

## 2022-08-16 DIAGNOSIS — M6281 Muscle weakness (generalized): Secondary | ICD-10-CM

## 2022-08-16 DIAGNOSIS — M25561 Pain in right knee: Secondary | ICD-10-CM

## 2022-08-16 MED ORDER — OXYCODONE-ACETAMINOPHEN 5-325 MG PO TABS: 1.0000 | ORAL_TABLET | Freq: Three times a day (TID) | ORAL | 0 refills | Status: DC | PRN

## 2022-08-16 NOTE — Therapy (Signed)
OUTPATIENT PHYSICAL THERAPY LOWER EXTREMITY TREATMENT  Referring diagnosis? M17.11 (ICD-10-CM) - Primary osteoarthritis of right knee Treatment diagnosis? (if different than referring diagnosis) m25.561 What was this (referring dx) caused by? [x]  Surgery []  Fall []  Ongoing issue [x]  Arthritis []  Other: ____________  Laterality: [x]  Rt []  Lt []  Both  Check all possible CPT codes:  *CHOOSE 10 OR LESS*    [x]  97110 (Therapeutic Exercise)  []  92507 (SLP Treatment)  [x]  97112 (Neuro Re-ed)   []  92526 (Swallowing Treatment)   [x]  97116 (Gait Training)   []  [id number] (Cognitive Training, 1st 15 minutes) [x]  97140 (Manual Therapy)   []  97130 (Cognitive Training, each add'l 15 minutes)  []  97164 (Re-evaluation)                              []  Other, List CPT Code ____________  [x]  97530 (Therapeutic Activities)     [x]  97535 (Self Care)   []  All codes above (97110 - 97535)  []  97012 (Mechanical Traction)  [x]  70623 (E-stim Unattended)  []  97032 (E-stim manual)  []  97033 (Ionto)  []  97035 (Ultrasound) []  97750 (Physical Performance Training) []  [id number] (Aquatic Therapy) []  97016 (Vasopneumatic Device) []  [id number] (Paraffin) []  97034 (Contrast Bath) []  97597 (Wound Care 1st 20 sq cm) []  97598 (Wound Care each add'l 20 sq cm) []  97760 (Orthotic Fabrication, Fitting, Training Initial) []  [id number] (Prosthetic Management and Training Initial) []  [id number] (Orthotic or Prosthetic Training/ Modification Subsequent)   Patient Name: Paula Conway MRN: [id number] DOB:06-13-1955, 67 y.o., female Today's Date: 08/16/2022  END OF SESSION:  PT End of Session - 08/16/22 1044     Visit Number 4    Number of Visits 16    Date for PT Re-Evaluation 09/20/22    Authorization Type [employer]    Authorization - Visit Number 4    Progress Note Due on Visit 10    PT Start Time 1018    PT Stop Time 1059    PT Time Calculation (min) 41 min    Activity Tolerance Patient tolerated treatment well;No increased pain     Behavior During Therapy [facility] for tasks assessed/performed                Past Medical History:  Diagnosis Date   Allergy    Arthritis    Complication of anesthesia    waking up during colonoscopy and difficulty waking up   Environmental and seasonal allergies 07/20/2015   Family history of adverse reaction to anesthesia    pt's mother and daughter have difficulty waking up   Family history of breast cancer in Mother 07/20/2015   Mother was diagnosed at 40 years old and died age 91 of breast cancer    GERD (gastroesophageal reflux disease)    Headache    History of kidney stones    Hyperlipidemia    Hypertension    stressed induced   Obesity 07/20/2015   Pre-diabetes    Spinal stenosis    Past Surgical History:  Procedure Laterality Date   APPENDECTOMY     BREAST BIOPSY Right    COLONOSCOPY     core biopsy     TOTAL KNEE ARTHROPLASTY Right 07/25/2022   Procedure: RIGHT TOTAL KNEE ARTHROPLASTY;  Surgeon: Tarry Kos, MD;  Location: [facility] OR;  Service: Orthopedics;  Laterality: Right;   VAGINA SURGERY     after having a baby   Patient Active Problem List   Diagnosis  Date Noted   Status post total right knee replacement 07/25/2022   Primary osteoarthritis of right knee 07/24/2022   Dysuria 07/05/2022   Bilateral primary osteoarthritis of knee 08/12/2021   Chronic pain of right knee 08/05/2020   Statin declined 11/16/2018   Grief reaction- loss of husband [name] Aug 8th, 2020 10/02/2018   Dysfunction of both eustachian tubes 07/24/2017   BPPV (benign paroxysmal positional vertigo), unspecified laterality 07/24/2017   Vitamin D insufficiency 03/15/2017   Lumbar discogenic pain syndrome 02/15/2017   Radiculopathy, lumbar region 02/15/2017   Arthritis of facet joints at multiple vertebral levels 02/15/2017   Low back pain 02/15/2017   Obesity, Class I, BMI 30-34.9 08/30/2016   Prediabetes 07/30/2015   Elevated LDL cholesterol level 07/30/2015   Family history of  breast cancer in Mother 07/20/2015   GERD (gastroesophageal reflux disease) 07/20/2015   Generalized OA 07/20/2015   Hyperlipidemia 07/20/2015   Environmental and seasonal allergies 07/20/2015   Spinal stenosis: Chronic back pain  07/20/2015   Neoplasm of connective tissue 11/23/2011    PCP: [name], PA   REFERRING PROVIDER: Tarry Kos, MD  REFERRING DIAG: M17.11 (ICD-10-CM) - Primary osteoarthritis of right knee Z96.651 (ICD-10-CM) - Status post total right knee replacement  THERAPY DIAG:  Acute pain of right knee  Stiffness of right hip, not elsewhere classified  Muscle weakness (generalized)  Difficulty in walking, not elsewhere classified  Localized edema  Rationale for Evaluation and Treatment: Rehabilitation  ONSET DATE: Rt TKA 07/25/22   SUBJECTIVE:   SUBJECTIVE STATEMENT:   Not sure what I did or didn't do over the weekend, its been hurting me more than before. I have a hard spot on the outside of my knee not sure if its the kneecap or what.    PERTINENT HISTORY: PMH includes Rt TKA,OA, GERD, HLD, HTN, spinal stenosis. PAIN:  NPRS scale: 2/10 Pain location: lateral R knee  Pain description: small throb and then discomfort/tightness  Aggravating factors: some of the exercises, bending her knee.  Relieving factors: rest, meds   PRECAUTIONS: None  RED FLAGS: None   WEIGHT BEARING RESTRICTIONS: No  FALLS:  Has patient fallen in last 6 months? No  LIVING ENVIRONMENT: 15 steps with handrail on left  OCCUPATION: works part time sitting work parking   PLOF: Independent  PATIENT GOALS: be able to drive before 6 weeks.   NEXT MD VISIT: 09/06/22  OBJECTIVE:   DIAGNOSTIC FINDINGS:   PATIENT SURVEYS:  FOTO 43% functional intake, goal is 55%  COGNITION: Overall cognitive status: Within functional limits for tasks assessed    EDEMA:  Moderate edema noted Rt knee at eval  LOWER EXTREMITY ROM:  Active ROM/PROM Right eval Right  08/12/2022  Hip flexion    Hip extension    Hip abduction    Hip adduction    Hip internal rotation    Hip external rotation    Knee flexion A:90 P:95 107  Knee extension A:4 -2  Ankle dorsiflexion    Ankle plantarflexion    Ankle inversion    Ankle eversion     (Blank rows = not tested)  LOWER EXTREMITY MMT:  MMT Right eval Left eval  Hip flexion 4   Hip extension    Hip abduction 4   Hip adduction    Hip internal rotation    Hip external rotation    Knee flexion 4   Knee extension 4   Ankle dorsiflexion    Ankle plantarflexion  Ankle inversion    Ankle eversion     (Blank rows = not tested)   FUNCTIONAL TESTS:  Timed up and go (TUG): 10:25 without AD  GAIT: Eval Comments: ambulates in with RW, but able to walk 25 feet X 2 without AD with loose supervision.    TODAY'S TREATMENT:   08/16/22  SelfCare  Lots of education about edema on lateral knee/patella still in place and not dislocated/no signs of infection, activity modification/tolerance, signs that she has overdone, everyone has different tolerance to activity after this surgery, general course of recovery in PT after TKR, progression of activity, technique for retrograde massage    TherEx  UBE all 4 extremities x9 minutes- 3 minutes at seat 9, 3 minutes at seat 8, 3 minutes at seat 7 full rotations SAQs 3# x12 with 3 second holds Bridges with progressive knee flexion 3x5 rounds    Manual  Retrograde massage LE elevated great improvement in edema noted     08/12/2022 Recumbent bike Seat 7 for 10 minutes AAROM Tailgate knee flexion 1 minute Seated knee flexion 10 x 10 seconds Seated straight leg raises 3 sets of 5 bilateral Standing hip abduction with Thera-band resistance (red) 2 sets of 10 x bilateral  Functional Activities: Double Leg Press 75# 10 x slow eccentrics full extension to full flexion Single Leg Press 31# 10 x slow eccentrics full extension to full flexion  Vaso right knee  10 minutes medium 34*   08/11/2022 Recumbent bike Seat 7 for 5 minutes AAROM Tailgate knee flexion 1 minute Seated knee flexion 10 x 5 seconds Seated straight leg raises 3 sets of 5 bilateral Standing hip abduction with Thera-band resistance (red) 10 x bilateral  Functional Activities: Double Leg Press 75# 10 x slow eccentrics full extension to full flexion Single Leg Press 31# 10 x slow eccentrics full extension to full flexion  Vaso right knee 10 minutes medium 34*   Eval Therex HEP creation and review with demonstration and trial set preformed, see below for details Nu step L6 X 6 min UE/LE  Manual therapy for Rt knee PROM into flexion to tolerance  Vasopnuematic device X 10 min, medium compression, 34 deg to Rt knee   PATIENT EDUCATION: Education details: HEP, PT plan of care Person educated: Patient Education method: Explanation, Demonstration, Verbal cues, and Handouts Education comprehension: verbalized understanding and needs further education   HOME EXERCISE PROGRAM: Access Code: [email or web address] URL: [email or web address] Date: 08/09/2022 Prepared by: Ivery Quale  Exercises - Seated Knee Flexion Stretch  - 2 x daily - 6 x weekly - 1-2 sets - 10 reps - 5 sec hold - Seated Straight Leg Raise with Quad Contraction  - 2 x daily - 6 x weekly - 3 sets - 10 reps - Sit to Stand  - 2 x daily - 6 x weekly - 1-2 sets - 10 reps - Standing Hip Abduction with Resistance at Ankles and Counter Support  - 2 x daily - 6 x weekly - 2 sets - 10 reps - Mini Squat with Counter Support  - 2 x daily - 6 x weekly - 1-2 sets - 10 reps  ASSESSMENT:  CLINICAL IMPRESSION:   Shawonda arrives today concerned over the edema in her lateral knee, PT examined area and lots of education provided (see above). Seems clear of acute injury or infection, encouraged ongoing edema management and activity modification at home as appropriate. Otherwise spent time working on flexion ROM and quad  strength, also introduced retrograde massage  for edema and recommended this at home.   OBJECTIVE IMPAIRMENTS: decreased activity tolerance, difficulty walking, decreased balance, decreased endurance, decreased mobility, decreased ROM, decreased strength, impaired flexibility, impaired LE use, and pain.  ACTIVITY LIMITATIONS: bending, lifting, carry, locomotion, cleaning, community activity, driving, and or occupation  PERSONAL FACTORS: PMH includes Rt TKA,OA, GERD, HLD, HTN, spinal stenosis. are also affecting patient's functional outcome.  REHAB POTENTIAL: Good  CLINICAL DECISION MAKING: Stable/uncomplicated  EVALUATION COMPLEXITY: Low    GOALS: Short term PT Goals Target date: 09/06/2022   Pt will be I and compliant with HEP. Baseline:  Goal status: Met 08/12/2022   Long term PT goals Target date:11/01/2022  Pt will improve knee ROM to [facility] 0-110 to improve functional mobility Baseline: Goal status: On Going 08/12/2022 Pt will improve  hip/knee strength to at least 5-/5 MMT to improve functional strength Baseline: Goal status: New Pt will improve FOTO to at least 55% functional to show improved function Baseline: Goal status: New Pt will reduce pain to overall less than 2-3/10 with usual activity and work activity. Baseline: Goal status: New Pt will be able to ambulate community distances at least 500 ft WNL gait pattern without complaints Baseline: Goal status: New 6. Pt will improve TUG time to less than 9 seconds to show improved gait speed and sit to stand ability Goal status: NEW  PLAN: PT FREQUENCY: 1-3 times per week   PT DURATION: 6 weeks  PLANNED INTERVENTIONS (unless contraindicated): aquatic PT, Canalith repositioning, cryotherapy, Electrical stimulation, Iontophoresis with 4 mg/ml dexamethasome, Moist heat, traction, Ultrasound, gait training, Therapeutic exercise, balance training, neuromuscular re-education, patient/family education, prosthetic training,  manual techniques, passive ROM, dry needling, taping, vasopnuematic device, vestibular, spinal manipulations, joint manipulations  PLAN FOR NEXT SESSION: Edema control and quadriceps strength exercises as appropriate.  Progress to dynamic gait without AD, review stairs, vaso PRN    Nedra Hai, PT, DPT 08/16/22 10:59 AM

## 2022-08-16 NOTE — Telephone Encounter (Signed)
Pt requesting refill on Oxycodone Sent to [facility] on file

## 2022-08-16 NOTE — Telephone Encounter (Signed)
Sent in but changed instructions

## 2022-08-16 NOTE — Telephone Encounter (Signed)
Refilled meds.  Ice area for now.  Ok for [employer]

## 2022-08-16 NOTE — Telephone Encounter (Signed)
Notified patient.

## 2022-08-17 ENCOUNTER — Ambulatory Visit (INDEPENDENT_AMBULATORY_CARE_PROVIDER_SITE_OTHER): Payer: Medicare PPO | Admitting: Rehabilitative and Restorative Service Providers"

## 2022-08-17 ENCOUNTER — Encounter: Payer: Self-pay | Admitting: Rehabilitative and Restorative Service Providers"

## 2022-08-17 DIAGNOSIS — R262 Difficulty in walking, not elsewhere classified: Secondary | ICD-10-CM

## 2022-08-17 DIAGNOSIS — M6281 Muscle weakness (generalized): Secondary | ICD-10-CM | POA: Diagnosis not present

## 2022-08-17 DIAGNOSIS — M25561 Pain in right knee: Secondary | ICD-10-CM

## 2022-08-17 DIAGNOSIS — R600 Localized edema: Secondary | ICD-10-CM

## 2022-08-17 DIAGNOSIS — M25651 Stiffness of right hip, not elsewhere classified: Secondary | ICD-10-CM

## 2022-08-17 NOTE — Therapy (Signed)
OUTPATIENT PHYSICAL THERAPY TREATMENT   Patient Name: ANUJIN COSTON MRN: [id number] DOB:05/06/1955, 67 y.o., female Today's Date: 08/17/2022  END OF SESSION:  PT End of Session - 08/17/22 0955     Visit Number 5    Number of Visits 16    Date for PT Re-Evaluation 09/20/22    Authorization Type [employer]    Authorization - Visit Number 5    Authorization - Number of Visits 12    Progress Note Due on Visit 10    PT Start Time 0955    PT Stop Time 1044    PT Time Calculation (min) 49 min    Activity Tolerance Patient tolerated treatment well    Behavior During Therapy [facility] for tasks assessed/performed                 Past Medical History:  Diagnosis Date   Allergy    Arthritis    Complication of anesthesia    waking up during colonoscopy and difficulty waking up   Environmental and seasonal allergies 07/20/2015   Family history of adverse reaction to anesthesia    pt's mother and daughter have difficulty waking up   Family history of breast cancer in Mother 07/20/2015   Mother was diagnosed at 22 years old and died age 70 of breast cancer    GERD (gastroesophageal reflux disease)    Headache    History of kidney stones    Hyperlipidemia    Hypertension    stressed induced   Obesity 07/20/2015   Pre-diabetes    Spinal stenosis    Past Surgical History:  Procedure Laterality Date   APPENDECTOMY     BREAST BIOPSY Right    COLONOSCOPY     core biopsy     TOTAL KNEE ARTHROPLASTY Right 07/25/2022   Procedure: RIGHT TOTAL KNEE ARTHROPLASTY;  Surgeon: Tarry Kos, MD;  Location: [facility] OR;  Service: Orthopedics;  Laterality: Right;   VAGINA SURGERY     after having a baby   Patient Active Problem List   Diagnosis Date Noted   Status post total right knee replacement 07/25/2022   Primary osteoarthritis of right knee 07/24/2022   Dysuria 07/05/2022   Bilateral primary osteoarthritis of knee 08/12/2021   Chronic pain of right knee 08/05/2020   Statin declined  11/16/2018   Grief reaction- loss of husband [name] Aug 8th, 2020 10/02/2018   Dysfunction of both eustachian tubes 07/24/2017   BPPV (benign paroxysmal positional vertigo), unspecified laterality 07/24/2017   Vitamin D insufficiency 03/15/2017   Lumbar discogenic pain syndrome 02/15/2017   Radiculopathy, lumbar region 02/15/2017   Arthritis of facet joints at multiple vertebral levels 02/15/2017   Low back pain 02/15/2017   Obesity, Class I, BMI 30-34.9 08/30/2016   Prediabetes 07/30/2015   Elevated LDL cholesterol level 07/30/2015   Family history of breast cancer in Mother 07/20/2015   GERD (gastroesophageal reflux disease) 07/20/2015   Generalized OA 07/20/2015   Hyperlipidemia 07/20/2015   Environmental and seasonal allergies 07/20/2015   Spinal stenosis: Chronic back pain  07/20/2015   Neoplasm of connective tissue 11/23/2011    PCP: [name], PA   REFERRING PROVIDER: Tarry Kos, MD  REFERRING DIAG: M17.11 (ICD-10-CM) - Primary osteoarthritis of right knee Z96.651 (ICD-10-CM) - Status post total right knee replacement  THERAPY DIAG:  Acute pain of right knee  Stiffness of right hip, not elsewhere classified  Muscle weakness (generalized)  Difficulty in walking, not elsewhere classified  Localized edema  Rationale for Evaluation and Treatment: Rehabilitation  ONSET DATE: Rt TKA 07/25/22   SUBJECTIVE:   SUBJECTIVE STATEMENT:   Not sure what I did or didn't do over the weekend, its been hurting me more than before. I have a hard spot on the outside of my knee not sure if its the kneecap or what.    PERTINENT HISTORY: PMH includes Rt TKA,OA, GERD, HLD, HTN, spinal stenosis.  PAIN:  NPRS scale: 2/10 Pain location: lateral Rt knee  Pain description: small throb and then discomfort/tightness  Aggravating factors: some of the exercises, bending her knee.  Relieving factors: rest, meds   PRECAUTIONS: None  RED FLAGS: None   WEIGHT BEARING  RESTRICTIONS: No  FALLS:  Has patient fallen in last 6 months? No  LIVING ENVIRONMENT: 15 steps with handrail on left  OCCUPATION: works part time sitting work parking   PLOF: Independent  PATIENT GOALS: be able to drive before 6 weeks.   NEXT MD VISIT: 09/06/22  OBJECTIVE:   PATIENT SURVEYS:  08/09/2022 FOTO 43% functional intake, goal is 55%  COGNITION: 08/09/2022 Overall cognitive status: Within functional limits for tasks assessed    EDEMA:  08/09/2022 Moderate edema noted Rt knee at eval  LOWER EXTREMITY ROM:  Active ROM/PROM Right 08/09/2022 Right 08/12/2022 Right 08/17/2022  Hip flexion     Hip extension     Hip abduction     Hip adduction     Hip internal rotation     Hip external rotation     Knee flexion A:90 P:95 107 108 AROM in supine heel slide  Knee extension A:4 -2   Ankle dorsiflexion     Ankle plantarflexion     Ankle inversion     Ankle eversion      (Blank rows = not tested)  LOWER EXTREMITY MMT:  MMT Right 08/09/2022 Left 08/09/2022  Hip flexion 4   Hip extension    Hip abduction 4   Hip adduction    Hip internal rotation    Hip external rotation    Knee flexion 4   Knee extension 4   Ankle dorsiflexion    Ankle plantarflexion    Ankle inversion    Ankle eversion     (Blank rows = not tested)   FUNCTIONAL TESTS:  08/09/2022 Timed up and go (TUG): 10:25 without AD  GAIT: 08/17/2022 :  SPC in Rt UE upon arrival.  Cues and education given for Lt UE use.   08/09/2022 Eval Comments: ambulates in with RW, but able to walk 25 feet X 2 without AD with loose supervision.                    TODAY'S TREATMENT:                                                                      DATE: 08/17/2022 Therex Time spent in continued education of swelling reduction techniques including elevation, ankle pumps and other muscle pump activation ROM for Rt leg. UBE UE/LE 8 mins seat 8/9 for ROM Gastroc incline stretch bilateral 30 sec x 3 Leg press  double leg 75 lbs x 15, single leg 31 lbs 2 x 10 Rt leg  Seated Rt knee LAQ with end  range pauses x 10 Supine Rt knee LAQ with ankle pump in 90 deg hip flexion x20 (cues for home  Gait Training Education and cues for sequencing for use of SPC in Lt UE for improved techniques.   Vasopneumatic  10 mins in elevation Rt knee medium compression 34 deg.    TODAY'S TREATMENT:                                                                      DATE: 08/16/2022 SelfCare Lots of education about edema on lateral knee/patella still in place and not dislocated/no signs of infection, activity modification/tolerance, signs that she has overdone, everyone has different tolerance to activity after this surgery, general course of recovery in PT after TKR, progression of activity, technique for retrograde massage   TherEx UBE all 4 extremities x9 minutes- 3 minutes at seat 9, 3 minutes at seat 8, 3 minutes at seat 7 full rotations SAQs 3# x12 with 3 second holds Bridges with progressive knee flexion 3x5 rounds   Manual Retrograde massage LE elevated great improvement in edema noted   TODAY'S TREATMENT:                                                                      DATE: 08/12/2022 Recumbent bike Seat 7 for 10 minutes AAROM Tailgate knee flexion 1 minute Seated knee flexion 10 x 10 seconds Seated straight leg raises 3 sets of 5 bilateral Standing hip abduction with Thera-band resistance (red) 2 sets of 10 x bilateral  Functional Activities: Double Leg Press 75# 10 x slow eccentrics full extension to full flexion Single Leg Press 31# 10 x slow eccentrics full extension to full flexion  Vaso right knee 10 minutes medium 34*    PATIENT EDUCATION: Education details: HEP, PT plan of care Person educated: Patient Education method: Explanation, Demonstration, Verbal cues, and Handouts Education comprehension: verbalized understanding and needs further education   HOME EXERCISE PROGRAM: Access  Code: [email or web address] URL: [email or web address] Date: 08/09/2022 Prepared by: Ivery Quale  Exercises - Seated Knee Flexion Stretch  - 2 x daily - 6 x weekly - 1-2 sets - 10 reps - 5 sec hold - Seated Straight Leg Raise with Quad Contraction  - 2 x daily - 6 x weekly - 3 sets - 10 reps - Sit to Stand  - 2 x daily - 6 x weekly - 1-2 sets - 10 reps - Standing Hip Abduction with Resistance at Ankles and Counter Support  - 2 x daily - 6 x weekly - 2 sets - 10 reps - Mini Squat with Counter Support  - 2 x daily - 6 x weekly - 1-2 sets - 10 reps  ASSESSMENT:  CLINICAL IMPRESSION:  Improved gait stability with cane in Lt UE.  Cues continued on edema reduction techniques and normal processes of post surgical edema.  ROM flexion close to goal overall but quality can still improve.  Recommend continued skilled PT services at this time.   OBJECTIVE IMPAIRMENTS: decreased  activity tolerance, difficulty walking, decreased balance, decreased endurance, decreased mobility, decreased ROM, decreased strength, impaired flexibility, impaired LE use, and pain.  ACTIVITY LIMITATIONS: bending, lifting, carry, locomotion, cleaning, community activity, driving, and or occupation  PERSONAL FACTORS: PMH includes Rt TKA,OA, GERD, HLD, HTN, spinal stenosis. are also affecting patient's functional outcome.  REHAB POTENTIAL: Good  CLINICAL DECISION MAKING: Stable/uncomplicated  EVALUATION COMPLEXITY: Low    GOALS: Short term PT Goals Target date: 09/06/2022   Pt will be I and compliant with HEP. Baseline:  Goal status: Met 08/12/2022   Long term PT goals Target date:11/01/2022  Pt will improve knee ROM to [facility] 0-110 to improve functional mobility  Goal status: On Going 08/12/2022  Pt will improve  hip/knee strength to at least 5-/5 MMT to improve functional strength  Goal status: New  Pt will improve FOTO to at least 55% functional to show improved function  Goal status: New  Pt will reduce  pain to overall less than 2-3/10 with usual activity and work activity.  Goal status: New  Pt will be able to ambulate community distances at least 500 ft WNL gait pattern without complaints  Goal status: New  6. Pt will improve TUG time to less than 9 seconds to show improved gait speed and sit to stand ability  Goal status: NEW  PLAN: PT FREQUENCY: 1-3 times per week   PT DURATION: 6 weeks  PLANNED INTERVENTIONS (unless contraindicated): aquatic PT, Canalith repositioning, cryotherapy, Electrical stimulation, Iontophoresis with 4 mg/ml dexamethasome, Moist heat, traction, Ultrasound, gait training, Therapeutic exercise, balance training, neuromuscular re-education, patient/family education, prosthetic training, manual techniques, passive ROM, dry needling, taping, vasopnuematic device, vestibular, spinal manipulations, joint manipulations  PLAN FOR NEXT SESSION:   Improved quality of Rt knee AROM, edema reduction, progressive strengthening as tolerated.    Chyrel Masson, PT, DPT, OCS, ATC 08/17/22  10:41 AM

## 2022-08-19 ENCOUNTER — Encounter: Payer: Self-pay | Admitting: Rehabilitative and Restorative Service Providers"

## 2022-08-19 ENCOUNTER — Ambulatory Visit: Payer: Medicare PPO | Admitting: Rehabilitative and Restorative Service Providers"

## 2022-08-19 DIAGNOSIS — M25561 Pain in right knee: Secondary | ICD-10-CM | POA: Diagnosis not present

## 2022-08-19 DIAGNOSIS — R262 Difficulty in walking, not elsewhere classified: Secondary | ICD-10-CM

## 2022-08-19 DIAGNOSIS — M25651 Stiffness of right hip, not elsewhere classified: Secondary | ICD-10-CM | POA: Diagnosis not present

## 2022-08-19 DIAGNOSIS — M6281 Muscle weakness (generalized): Secondary | ICD-10-CM | POA: Diagnosis not present

## 2022-08-19 DIAGNOSIS — R600 Localized edema: Secondary | ICD-10-CM | POA: Diagnosis not present

## 2022-08-19 NOTE — Therapy (Signed)
OUTPATIENT PHYSICAL THERAPY TREATMENT   Patient Name: Paula Conway MRN: [id number] DOB:09-28-1955, 67 y.o., female Today's Date: 08/19/2022  END OF SESSION:  PT End of Session - 08/19/22 1056     Visit Number 6    Number of Visits 16    Date for PT Re-Evaluation 09/20/22    Authorization Type [employer]    Authorization - Visit Number 6    Authorization - Number of Visits 12    Progress Note Due on Visit 10    PT Start Time 1056    PT Stop Time 1144    PT Time Calculation (min) 48 min    Activity Tolerance Patient tolerated treatment well;No increased pain;Patient limited by pain    Behavior During Therapy [facility] for tasks assessed/performed              Past Medical History:  Diagnosis Date   Allergy    Arthritis    Complication of anesthesia    waking up during colonoscopy and difficulty waking up   Environmental and seasonal allergies 07/20/2015   Family history of adverse reaction to anesthesia    pt's mother and daughter have difficulty waking up   Family history of breast cancer in Mother 07/20/2015   Mother was diagnosed at 59 years old and died age 66 of breast cancer    GERD (gastroesophageal reflux disease)    Headache    History of kidney stones    Hyperlipidemia    Hypertension    stressed induced   Obesity 07/20/2015   Pre-diabetes    Spinal stenosis    Past Surgical History:  Procedure Laterality Date   APPENDECTOMY     BREAST BIOPSY Right    COLONOSCOPY     core biopsy     TOTAL KNEE ARTHROPLASTY Right 07/25/2022   Procedure: RIGHT TOTAL KNEE ARTHROPLASTY;  Surgeon: Tarry Kos, MD;  Location: [facility] OR;  Service: Orthopedics;  Laterality: Right;   VAGINA SURGERY     after having a baby   Patient Active Problem List   Diagnosis Date Noted   Status post total right knee replacement 07/25/2022   Primary osteoarthritis of right knee 07/24/2022   Dysuria 07/05/2022   Bilateral primary osteoarthritis of knee 08/12/2021   Chronic pain of right  knee 08/05/2020   Statin declined 11/16/2018   Grief reaction- loss of husband [name] Aug 8th, 2020 10/02/2018   Dysfunction of both eustachian tubes 07/24/2017   BPPV (benign paroxysmal positional vertigo), unspecified laterality 07/24/2017   Vitamin D insufficiency 03/15/2017   Lumbar discogenic pain syndrome 02/15/2017   Radiculopathy, lumbar region 02/15/2017   Arthritis of facet joints at multiple vertebral levels 02/15/2017   Low back pain 02/15/2017   Obesity, Class I, BMI 30-34.9 08/30/2016   Prediabetes 07/30/2015   Elevated LDL cholesterol level 07/30/2015   Family history of breast cancer in Mother 07/20/2015   GERD (gastroesophageal reflux disease) 07/20/2015   Generalized OA 07/20/2015   Hyperlipidemia 07/20/2015   Environmental and seasonal allergies 07/20/2015   Spinal stenosis: Chronic back pain  07/20/2015   Neoplasm of connective tissue 11/23/2011    PCP: [name], PA   REFERRING PROVIDER: Tarry Kos, MD  REFERRING DIAG: M17.11 (ICD-10-CM) - Primary osteoarthritis of right knee Z96.651 (ICD-10-CM) - Status post total right knee replacement  THERAPY DIAG:  Acute pain of right knee  Stiffness of right hip, not elsewhere classified  Muscle weakness (generalized)  Difficulty in walking, not elsewhere classified  Localized  edema  Rationale for Evaluation and Treatment: Rehabilitation  ONSET DATE: Rt TKA 07/25/22   SUBJECTIVE:   SUBJECTIVE STATEMENT:   Not sure what I did or didn't do over the weekend, its been hurting me more than before. I have a hard spot on the outside of my knee not sure if its the kneecap or what.    PERTINENT HISTORY: PMH includes Rt TKA,OA, GERD, HLD, HTN, spinal stenosis.  PAIN:  NPRS scale: 2/10 Pain location: lateral Rt knee  Pain description: small throb and then discomfort/tightness  Aggravating factors: some of the exercises, bending her knee.  Relieving factors: rest, meds   PRECAUTIONS: None  RED  FLAGS: None   WEIGHT BEARING RESTRICTIONS: No  FALLS:  Has patient fallen in last 6 months? No  LIVING ENVIRONMENT: 15 steps with handrail on left  OCCUPATION: works part time sitting work parking   PLOF: Independent  PATIENT GOALS: be able to drive before 6 weeks.   NEXT MD VISIT: 09/06/22  OBJECTIVE:   PATIENT SURVEYS:  08/09/2022 FOTO 43% functional intake, goal is 55%  COGNITION: 08/09/2022 Overall cognitive status: Within functional limits for tasks assessed    EDEMA:  08/09/2022 Moderate edema noted Rt knee at eval  LOWER EXTREMITY ROM:  Active ROM/PROM Right 08/09/2022 Right 08/12/2022 Right 08/17/2022  Hip flexion     Hip extension     Hip abduction     Hip adduction     Hip internal rotation     Hip external rotation     Knee flexion A:90 P:95 107 108 AROM in supine heel slide  Knee extension A:4 -2   Ankle dorsiflexion     Ankle plantarflexion     Ankle inversion     Ankle eversion      (Blank rows = not tested)  LOWER EXTREMITY MMT:  MMT Right 08/09/2022 Left 08/09/2022  Hip flexion 4   Hip extension    Hip abduction 4   Hip adduction    Hip internal rotation    Hip external rotation    Knee flexion 4   Knee extension 4   Ankle dorsiflexion    Ankle plantarflexion    Ankle inversion    Ankle eversion     (Blank rows = not tested)   FUNCTIONAL TESTS:  08/09/2022 Timed up and go (TUG): 10:25 without AD  GAIT: 08/17/2022 :  SPC in Rt UE upon arrival.  Cues and education given for Lt UE use.   08/09/2022 Eval Comments: ambulates in with RW, but able to walk 25 feet X 2 without AD with loose supervision.                    TODAY'S TREATMENT:                                                                      DATE:  08/19/2022 Recumbent bike Seat 7 for 5 minutes AAROM to AROM  Tailgate knee flexion 1 minute Seated knee flexion 10 x 10 seconds Seated straight leg raises 4 sets of 5 bilateral with 1# weight on ankle Standing hip abduction  with Thera-band resistance (red) 2 sets of 10 x bilateral  Functional Activities: Double Leg Press 87# 10 x slow  eccentrics full extension to full flexion Single Leg Press 37# 10 x slow eccentrics full extension to full flexion Step-down off 2 and 4 inch step 5 x each slow eccentrics  Vaso right knee 10 minutes medium 34*   08/17/2022 Therex Time spent in continued education of swelling reduction techniques including elevation, ankle pumps and other muscle pump activation ROM for Rt leg. UBE UE/LE 8 mins seat 8/9 for ROM Gastroc incline stretch bilateral 30 sec x 3 Leg press double leg 75 lbs x 15, single leg 31 lbs 2 x 10 Rt leg  Seated Rt knee LAQ with end range pauses x 10 Supine Rt knee LAQ with ankle pump in 90 deg hip flexion x20 (cues for home  Gait Training Education and cues for sequencing for use of SPC in Lt UE for improved techniques.   Vasopneumatic  10 mins in elevation Rt knee medium compression 34 deg.    08/16/2022 SelfCare Lots of education about edema on lateral knee/patella still in place and not dislocated/no signs of infection, activity modification/tolerance, signs that she has overdone, everyone has different tolerance to activity after this surgery, general course of recovery in PT after TKR, progression of activity, technique for retrograde massage   TherEx UBE all 4 extremities x9 minutes- 3 minutes at seat 9, 3 minutes at seat 8, 3 minutes at seat 7 full rotations SAQs 3# x12 with 3 second holds Bridges with progressive knee flexion 3x5 rounds   Manual Retrograde massage LE elevated great improvement in edema noted    PATIENT EDUCATION: Education details: HEP, PT plan of care Person educated: Patient Education method: Explanation, Demonstration, Verbal cues, and Handouts Education comprehension: verbalized understanding and needs further education   HOME EXERCISE PROGRAM: Access Code: [email or web address] URL: [email or web address] Date:  08/09/2022 Prepared by: Ivery Quale  Exercises - Seated Knee Flexion Stretch  - 2 x daily - 6 x weekly - 1-2 sets - 10 reps - 5 sec hold - Seated Straight Leg Raise with Quad Contraction  - 2 x daily - 6 x weekly - 3 sets - 10 reps - Sit to Stand  - 2 x daily - 6 x weekly - 1-2 sets - 10 reps - Standing Hip Abduction with Resistance at Ankles and Counter Support  - 2 x daily - 6 x weekly - 2 sets - 10 reps - Mini Squat with Counter Support  - 2 x daily - 6 x weekly - 1-2 sets - 10 reps  ASSESSMENT:  CLINICAL IMPRESSION:  Mikira is doing a great job with her home and supervised PT.  Edema control and quadriceps strength were a focus of today's work and she was encouraged to focus on this at home.  As quadriceps strength improves, edema will decrease and allow improved AROM.  Continue current plan to meet long-term goals.  OBJECTIVE IMPAIRMENTS: decreased activity tolerance, difficulty walking, decreased balance, decreased endurance, decreased mobility, decreased ROM, decreased strength, impaired flexibility, impaired LE use, and pain.  ACTIVITY LIMITATIONS: bending, lifting, carry, locomotion, cleaning, community activity, driving, and or occupation  PERSONAL FACTORS: PMH includes Rt TKA,OA, GERD, HLD, HTN, spinal stenosis. are also affecting patient's functional outcome.  REHAB POTENTIAL: Good  CLINICAL DECISION MAKING: Stable/uncomplicated  EVALUATION COMPLEXITY: Low    GOALS: Short term PT Goals Target date: 09/06/2022   Pt will be I and compliant with HEP. Baseline:  Goal status: Met 08/12/2022   Long term PT goals Target date:11/01/2022  Pt will improve knee ROM to [facility] 0-110 to  improve functional mobility  Goal status: On Going 08/12/2022  Pt will improve  hip/knee strength to at least 5-/5 MMT to improve functional strength  Goal status: New  Pt will improve FOTO to at least 55% functional to show improved function  Goal status: New  Pt will reduce pain to overall  less than 2-3/10 with usual activity and work activity.  Goal status: On Going 08/19/2022  Pt will be able to ambulate community distances at least 500 ft WNL gait pattern without complaints  Goal status: On Going 08/19/2022  6. Pt will improve TUG time to less than 9 seconds to show improved gait speed and sit to stand ability  Goal status: NEW  PLAN: PT FREQUENCY: 1-3 times per week   PT DURATION: 6 weeks  PLANNED INTERVENTIONS (unless contraindicated): aquatic PT, Canalith repositioning, cryotherapy, Electrical stimulation, Iontophoresis with 4 mg/ml dexamethasome, Moist heat, traction, Ultrasound, gait training, Therapeutic exercise, balance training, neuromuscular re-education, patient/family education, prosthetic training, manual techniques, passive ROM, dry needling, taping, vasopnuematic device, vestibular, spinal manipulations, joint manipulations  PLAN FOR NEXT SESSION:   Improve right knee AROM, edema reduction, quadriceps strengthening as appropriate.  Balance and functional progressions as appropriate.   Cherlyn Cushing PT, MPT 08/19/22  11:38 AM

## 2022-08-22 ENCOUNTER — Ambulatory Visit: Payer: Medicare PPO | Admitting: Rehabilitative and Restorative Service Providers"

## 2022-08-22 ENCOUNTER — Encounter: Payer: Self-pay | Admitting: Rehabilitative and Restorative Service Providers"

## 2022-08-22 DIAGNOSIS — R262 Difficulty in walking, not elsewhere classified: Secondary | ICD-10-CM | POA: Diagnosis not present

## 2022-08-22 DIAGNOSIS — M25651 Stiffness of right hip, not elsewhere classified: Secondary | ICD-10-CM

## 2022-08-22 DIAGNOSIS — M6281 Muscle weakness (generalized): Secondary | ICD-10-CM

## 2022-08-22 DIAGNOSIS — M25561 Pain in right knee: Secondary | ICD-10-CM | POA: Diagnosis not present

## 2022-08-22 DIAGNOSIS — R600 Localized edema: Secondary | ICD-10-CM

## 2022-08-22 NOTE — Therapy (Signed)
OUTPATIENT PHYSICAL THERAPY TREATMENT   Patient Name: Paula Conway MRN: [id number] DOB:07/23/1955, 67 y.o., female Today's Date: 08/22/2022  END OF SESSION:  PT End of Session - 08/22/22 1004     Visit Number 7    Number of Visits 16    Date for PT Re-Evaluation 09/20/22    Authorization Type [employer]    Authorization - Visit Number 7    Authorization - Number of Visits 12    Progress Note Due on Visit 10    PT Start Time 1004    PT Stop Time 1054    PT Time Calculation (min) 50 min    Activity Tolerance Patient tolerated treatment well    Behavior During Therapy WFL for tasks assessed/performed               Past Medical History:  Diagnosis Date   Allergy    Arthritis    Complication of anesthesia    waking up during colonoscopy and difficulty waking up   Environmental and seasonal allergies 07/20/2015   Family history of adverse reaction to anesthesia    pt's mother and daughter have difficulty waking up   Family history of breast cancer in Mother 07/20/2015   Mother was diagnosed at 75 years old and died age 59 of breast cancer    GERD (gastroesophageal reflux disease)    Headache    History of kidney stones    Hyperlipidemia    Hypertension    stressed induced   Obesity 07/20/2015   Pre-diabetes    Spinal stenosis    Past Surgical History:  Procedure Laterality Date   APPENDECTOMY     BREAST BIOPSY Right    COLONOSCOPY     core biopsy     TOTAL KNEE ARTHROPLASTY Right 07/25/2022   Procedure: RIGHT TOTAL KNEE ARTHROPLASTY;  Surgeon: Tarry Kos, MD;  Location: [facility] OR;  Service: Orthopedics;  Laterality: Right;   VAGINA SURGERY     after having a baby   Patient Active Problem List   Diagnosis Date Noted   Status post total right knee replacement 07/25/2022   Primary osteoarthritis of right knee 07/24/2022   Dysuria 07/05/2022   Bilateral primary osteoarthritis of knee 08/12/2021   Chronic pain of right knee 08/05/2020   Statin declined  11/16/2018   Grief reaction- loss of husband [name] Aug 8th, 2020 10/02/2018   Dysfunction of both eustachian tubes 07/24/2017   BPPV (benign paroxysmal positional vertigo), unspecified laterality 07/24/2017   Vitamin D insufficiency 03/15/2017   Lumbar discogenic pain syndrome 02/15/2017   Radiculopathy, lumbar region 02/15/2017   Arthritis of facet joints at multiple vertebral levels 02/15/2017   Low back pain 02/15/2017   Obesity, Class I, BMI 30-34.9 08/30/2016   Prediabetes 07/30/2015   Elevated LDL cholesterol level 07/30/2015   Family history of breast cancer in Mother 07/20/2015   GERD (gastroesophageal reflux disease) 07/20/2015   Generalized OA 07/20/2015   Hyperlipidemia 07/20/2015   Environmental and seasonal allergies 07/20/2015   Spinal stenosis: Chronic back pain  07/20/2015   Neoplasm of connective tissue 11/23/2011    PCP: [name], PA   REFERRING PROVIDER: Tarry Kos, MD  REFERRING DIAG: M17.11 (ICD-10-CM) - Primary osteoarthritis of right knee Z96.651 (ICD-10-CM) - Status post total right knee replacement  THERAPY DIAG:  Acute pain of right knee  Stiffness of right hip, not elsewhere classified  Muscle weakness (generalized)  Difficulty in walking, not elsewhere classified  Localized edema  Rationale for  Evaluation and Treatment: Rehabilitation  ONSET DATE: Rt TKA 07/25/22   SUBJECTIVE:   SUBJECTIVE STATEMENT:  Pt indicated waking up this morning around 4am with sharp pain.  Pt indicated general tightness upon arrival similar to usual complaints.    PERTINENT HISTORY: PMH includes Rt TKA,OA, GERD, HLD, HTN, spinal stenosis.  PAIN:  NPRS scale: upon arrival 3-4/10.   Pain location: Rt knee  Pain description: tightness/stiffness, sharp pain at night was reported.  Aggravating factors: some of the exercises, bending her knee.  Relieving factors: rest, meds   PRECAUTIONS: None  RED FLAGS: None   WEIGHT BEARING RESTRICTIONS:  No  FALLS:  Has patient fallen in last 6 months? No  LIVING ENVIRONMENT: 15 steps with handrail on left  OCCUPATION: works part time sitting work parking   PLOF: Independent  PATIENT GOALS: be able to drive before 6 weeks.   NEXT MD VISIT: 09/06/22  OBJECTIVE:   PATIENT SURVEYS:  08/09/2022 FOTO 43% functional intake, goal is 55%  COGNITION: 08/09/2022 Overall cognitive status: Within functional limits for tasks assessed    EDEMA:  08/09/2022 Moderate edema noted Rt knee at eval  LOWER EXTREMITY ROM:  Active ROM/PROM Right 08/09/2022 Right 08/12/2022 Right 08/17/2022  Hip flexion     Hip extension     Hip abduction     Hip adduction     Hip internal rotation     Hip external rotation     Knee flexion A:90 P:95 107 108 AROM in supine heel slide  Knee extension A:4 -2   Ankle dorsiflexion     Ankle plantarflexion     Ankle inversion     Ankle eversion      (Blank rows = not tested)  LOWER EXTREMITY MMT:  MMT Right 08/09/2022 Left 08/09/2022  Hip flexion 4   Hip extension    Hip abduction 4   Hip adduction    Hip internal rotation    Hip external rotation    Knee flexion 4   Knee extension 4   Ankle dorsiflexion    Ankle plantarflexion    Ankle inversion    Ankle eversion     (Blank rows = not tested)   FUNCTIONAL TESTS:  08/09/2022 Timed up and go (TUG): 10:25 without AD  GAIT: 08/17/2022 :  SPC in Rt UE upon arrival.  Cues and education given for Lt UE use.   08/09/2022 Eval Comments: ambulates in with RW, but able to walk 25 feet X 2 without AD with loose supervision.                    TODAY'S TREATMENT:                                                                      DATE: 08/22/2022 Therex UBE UE/LE 8 mins seat 9 for ROM Leg press double leg 87 lbs x 15, single leg 37 lbs 2 x 15 Rt leg  Gastroc incline stretch bilateral 30 sec x 3 Seated Rt LAQ with end range pauses 1-2 sec 4 lbs 2 x 15  Neuro Re-ed Tandem stance 1 min x 2 bilateral  with occasional hand touch on bar 6 inch hurdle step to pattern over 10 ft x 4  leading each leg in // bars with occasional HHA   Manual Supine Rt knee flexion with distraction/IR mobilization c movement for symptoms.   Vasopneumatic  10 mins in elevation Rt knee medium compression 34 deg.   TODAY'S TREATMENT:                                                                      DATE: 08/19/2022 Recumbent bike Seat 7 for 5 minutes AAROM to AROM  Tailgate knee flexion 1 minute Seated knee flexion 10 x 10 seconds Seated straight leg raises 4 sets of 5 bilateral with 1# weight on ankle Standing hip abduction with Thera-band resistance (red) 2 sets of 10 x bilateral  Functional Activities: Double Leg Press 87# 10 x slow eccentrics full extension to full flexion Single Leg Press 37# 10 x slow eccentrics full extension to full flexion Step-down off 2 and 4 inch step 5 x each slow eccentrics  Vaso right knee 10 minutes medium 34*   TODAY'S TREATMENT:                                                                      DATE: 08/17/2022 Therex Time spent in continued education of swelling reduction techniques including elevation, ankle pumps and other muscle pump activation ROM for Rt leg. UBE UE/LE 8 mins seat 8/9 for ROM Gastroc incline stretch bilateral 30 sec x 3 Leg press double leg 75 lbs x 15, single leg 31 lbs 2 x 10 Rt leg  Seated Rt knee LAQ with end range pauses x 10 Supine Rt knee LAQ with ankle pump in 90 deg hip flexion x20 (cues for home  Gait Training Education and cues for sequencing for use of SPC in Lt UE for improved techniques.   Vasopneumatic  10 mins in elevation Rt knee medium compression 34 deg.    TODAY'S TREATMENT:                                                                      DATE: 08/16/2022 SelfCare Lots of education about edema on lateral knee/patella still in place and not dislocated/no signs of infection, activity modification/tolerance, signs that  she has overdone, everyone has different tolerance to activity after this surgery, general course of recovery in PT after TKR, progression of activity, technique for retrograde massage   TherEx UBE all 4 extremities x9 minutes- 3 minutes at seat 9, 3 minutes at seat 8, 3 minutes at seat 7 full rotations SAQs 3# x12 with 3 second holds Bridges with progressive knee flexion 3x5 rounds   Manual Retrograde massage LE elevated great improvement in edema noted    PATIENT EDUCATION: Education details: HEP, PT plan of care Person educated: Patient Education method: Explanation, Demonstration, Verbal cues,  and Handouts Education comprehension: verbalized understanding and needs further education   HOME EXERCISE PROGRAM: Access Code: [email or web address] URL: [email or web address] Date: 08/09/2022 Prepared by: Ivery Quale  Exercises - Seated Knee Flexion Stretch  - 2 x daily - 6 x weekly - 1-2 sets - 10 reps - 5 sec hold - Seated Straight Leg Raise with Quad Contraction  - 2 x daily - 6 x weekly - 3 sets - 10 reps - Sit to Stand  - 2 x daily - 6 x weekly - 1-2 sets - 10 reps - Standing Hip Abduction with Resistance at Ankles and Counter Support  - 2 x daily - 6 x weekly - 2 sets - 10 reps - Mini Squat with Counter Support  - 2 x daily - 6 x weekly - 1-2 sets - 10 reps  ASSESSMENT:  CLINICAL IMPRESSION:  Pt to continue to benefit from improved quality of knee flexion in functional activity with reduced symptoms in movement.  Dynamic and compliant surface balance progression also to be helpful in improving ambulation control.   OBJECTIVE IMPAIRMENTS: decreased activity tolerance, difficulty walking, decreased balance, decreased endurance, decreased mobility, decreased ROM, decreased strength, impaired flexibility, impaired LE use, and pain.  ACTIVITY LIMITATIONS: bending, lifting, carry, locomotion, cleaning, community activity, driving, and or occupation  PERSONAL FACTORS: PMH  includes Rt TKA,OA, GERD, HLD, HTN, spinal stenosis. are also affecting patient's functional outcome.  REHAB POTENTIAL: Good  CLINICAL DECISION MAKING: Stable/uncomplicated  EVALUATION COMPLEXITY: Low    GOALS: Short term PT Goals Target date: 09/06/2022   Pt will be I and compliant with HEP. Baseline:  Goal status: Met 08/12/2022   Long term PT goals Target date:11/01/2022  Pt will improve knee ROM to [facility] 0-110 to improve functional mobility  Goal status: On Going 08/12/2022  Pt will improve  hip/knee strength to at least 5-/5 MMT to improve functional strength  Goal status: on going 08/22/2022  Pt will improve FOTO to at least 55% functional to show improved function  Goal status: on going 08/22/2022  Pt will reduce pain to overall less than 2-3/10 with usual activity and work activity.  Goal status: on going 08/22/2022  Pt will be able to ambulate community distances at least 500 ft WNL gait pattern without complaints  Goal status: on going 08/22/2022  6. Pt will improve TUG time to less than 9 seconds to show improved gait speed and sit to stand ability  Goal status: on going 08/22/2022  PLAN: PT FREQUENCY: 1-3 times per week   PT DURATION: 6 weeks  PLANNED INTERVENTIONS (unless contraindicated): aquatic PT, Canalith repositioning, cryotherapy, Electrical stimulation, Iontophoresis with 4 mg/ml dexamethasome, Moist heat, traction, Ultrasound, gait training, Therapeutic exercise, balance training, neuromuscular re-education, patient/family education, prosthetic training, manual techniques, passive ROM, dry needling, taping, vasopnuematic device, vestibular, spinal manipulations, joint manipulations  PLAN FOR NEXT SESSION:   Dynamic and compliant balance improvements.  Progressive WB quad strengthening.    Chyrel Masson, PT, DPT, OCS, ATC 08/22/22  10:49 AM

## 2022-08-24 ENCOUNTER — Encounter: Payer: Self-pay | Admitting: Rehabilitative and Restorative Service Providers"

## 2022-08-24 ENCOUNTER — Ambulatory Visit: Payer: Medicare PPO | Admitting: Rehabilitative and Restorative Service Providers"

## 2022-08-24 DIAGNOSIS — M25651 Stiffness of right hip, not elsewhere classified: Secondary | ICD-10-CM | POA: Diagnosis not present

## 2022-08-24 DIAGNOSIS — R600 Localized edema: Secondary | ICD-10-CM

## 2022-08-24 DIAGNOSIS — R262 Difficulty in walking, not elsewhere classified: Secondary | ICD-10-CM

## 2022-08-24 DIAGNOSIS — M25561 Pain in right knee: Secondary | ICD-10-CM | POA: Diagnosis not present

## 2022-08-24 DIAGNOSIS — M6281 Muscle weakness (generalized): Secondary | ICD-10-CM

## 2022-08-24 NOTE — Therapy (Signed)
OUTPATIENT PHYSICAL THERAPY TREATMENT   Patient Name: Paula Conway MRN: [id number] DOB:[birth year], 67 y.o., female Today's Date: 08/24/2022  END OF SESSION:  PT End of Session - 08/24/22 1021     Visit Number 8    Number of Visits 16    Date for PT Re-Evaluation 09/20/22    Authorization Type [employer]    Authorization - Visit Number 8    Authorization - Number of Visits 12    Progress Note Due on Visit 10    PT Start Time 1010    PT Stop Time 1100    PT Time Calculation (min) 50 min    Activity Tolerance Patient tolerated treatment well    Behavior During Therapy WFL for tasks assessed/performed                Past Medical History:  Diagnosis Date   Allergy    Arthritis    Complication of anesthesia    waking up during colonoscopy and difficulty waking up   Environmental and seasonal allergies 07/20/2015   Family history of adverse reaction to anesthesia    pt's mother and daughter have difficulty waking up   Family history of breast cancer in Mother 07/20/2015   Mother was diagnosed at 52 years old and died age 10 of breast cancer    GERD (gastroesophageal reflux disease)    Headache    History of kidney stones    Hyperlipidemia    Hypertension    stressed induced   Obesity 07/20/2015   Pre-diabetes    Spinal stenosis    Past Surgical History:  Procedure Laterality Date   APPENDECTOMY     BREAST BIOPSY Right    COLONOSCOPY     core biopsy     TOTAL KNEE ARTHROPLASTY Right 07/25/2022   Procedure: RIGHT TOTAL KNEE ARTHROPLASTY;  Surgeon: Tarry Kos, MD;  Location: [facility] OR;  Service: Orthopedics;  Laterality: Right;   VAGINA SURGERY     after having a baby   Patient Active Problem List   Diagnosis Date Noted   Status post total right knee replacement 07/25/2022   Primary osteoarthritis of right knee 07/24/2022   Dysuria 07/05/2022   Bilateral primary osteoarthritis of knee 08/12/2021   Chronic pain of right knee 08/05/2020   Statin declined  11/16/2018   Grief reaction- loss of husband [name] Aug 8th, 2020 10/02/2018   Dysfunction of both eustachian tubes 07/24/2017   BPPV (benign paroxysmal positional vertigo), unspecified laterality 07/24/2017   Vitamin D insufficiency 03/15/2017   Lumbar discogenic pain syndrome 02/15/2017   Radiculopathy, lumbar region 02/15/2017   Arthritis of facet joints at multiple vertebral levels 02/15/2017   Low back pain 02/15/2017   Obesity, Class I, BMI 30-34.9 08/30/2016   Prediabetes 07/30/2015   Elevated LDL cholesterol level 07/30/2015   Family history of breast cancer in Mother 07/20/2015   GERD (gastroesophageal reflux disease) 07/20/2015   Generalized OA 07/20/2015   Hyperlipidemia 07/20/2015   Environmental and seasonal allergies 07/20/2015   Spinal stenosis: Chronic back pain  07/20/2015   Neoplasm of connective tissue 11/23/2011    PCP: [name], PA   REFERRING PROVIDER: Tarry Kos, MD  REFERRING DIAG: M17.11 (ICD-10-CM) - Primary osteoarthritis of right knee Z96.651 (ICD-10-CM) - Status post total right knee replacement  THERAPY DIAG:  Acute pain of right knee  Stiffness of right hip, not elsewhere classified  Muscle weakness (generalized)  Difficulty in walking, not elsewhere classified  Localized edema  Rationale  for Evaluation and Treatment: Rehabilitation  ONSET DATE: Rt TKA 07/25/22   SUBJECTIVE:   SUBJECTIVE STATEMENT:  Pt indicated not taking medicine today to try to get used to not having it so she can drive.  Reported forgetting cane at home and was ok with that.     PERTINENT HISTORY: PMH includes Rt TKA,OA, GERD, HLD, HTN, spinal stenosis.  PAIN:  NPRS scale: no specific pain, just tigthness.  Pain location: Rt knee  Pain description: tightness/stiffness, sharp pain at night was reported.  Aggravating factors: some of the exercises, bending her knee.  Relieving factors: rest, meds   PRECAUTIONS: None  RED FLAGS: None   WEIGHT  BEARING RESTRICTIONS: No  FALLS:  Has patient fallen in last 6 months? No  LIVING ENVIRONMENT: 15 steps with handrail on left  OCCUPATION: works part time sitting work parking   PLOF: Independent  PATIENT GOALS: be able to drive before 6 weeks.   NEXT MD VISIT: 09/06/22  OBJECTIVE:   PATIENT SURVEYS:  08/09/2022 FOTO 43% functional intake, goal is 55%  COGNITION: 08/09/2022 Overall cognitive status: Within functional limits for tasks assessed    EDEMA:  08/09/2022 Moderate edema noted Rt knee at eval  LOWER EXTREMITY ROM:  Active ROM/PROM Right 08/09/2022 Right 08/12/2022 Right 08/17/2022  Hip flexion     Hip extension     Hip abduction     Hip adduction     Hip internal rotation     Hip external rotation     Knee flexion A:90 P:95 107 108 AROM in supine heel slide  Knee extension A:4 -2   Ankle dorsiflexion     Ankle plantarflexion     Ankle inversion     Ankle eversion      (Blank rows = not tested)  LOWER EXTREMITY MMT:  MMT Right 08/09/2022 Left 08/09/2022 Right 08/24/2022 Left 08/24/2022  Hip flexion 4  5/5   Hip extension      Hip abduction 4     Hip adduction      Hip internal rotation      Hip external rotation      Knee flexion 4  5/5   Knee extension 4  5/5 (check dynamometry in future   Ankle dorsiflexion      Ankle plantarflexion      Ankle inversion      Ankle eversion       (Blank rows = not tested)   FUNCTIONAL TESTS:  08/09/2022 Timed up and go (TUG): 10:25 without AD  GAIT: 08/24/2022:  Independent ambulation in clinic and to clinic with good safety  08/17/2022 :  SPC in Rt UE upon arrival.  Cues and education given for Lt UE use.   08/09/2022 Eval Comments: ambulates in with RW, but able to walk 25 feet X 2 without AD with loose supervision.                    TODAY'S TREATMENT:                                                                      DATE: 08/24/2022 Therex UBE UE/LE 8 mins seat 9 for ROM Leg press double leg 93 lbs  x 15, single leg 37  lbs 2 x 15 Rt leg  Gastroc incline stretch bilateral 30 sec x 3 Seated Rt LAQ with end range pauses 1-2 sec 5 lbs 2 x 15  TherActivity Step on over and down WB on Rt leg 4 inch step x 15 with single hand assist on bar Seated driving reaction pressure into bosu ball with blazepod reactive lights 2 second timeout 30 seconds x 4 , 15 sec rest breaks    Vasopneumatic  10 mins in elevation Rt knee medium compression 34 deg.   TODAY'S TREATMENT:                                                                      DATE: 08/22/2022 Therex UBE UE/LE 8 mins seat 9 for ROM Leg press double leg 87 lbs x 15, single leg 37 lbs 2 x 15 Rt leg  Gastroc incline stretch bilateral 30 sec x 3 Seated Rt LAQ with end range pauses 1-2 sec 4 lbs 2 x 15  Neuro Re-ed Tandem stance 1 min x 2 bilateral with occasional hand touch on bar 6 inch hurdle step to pattern over 10 ft x 4 leading each leg in // bars with occasional HHA   Manual Supine Rt knee flexion with distraction/IR mobilization c movement for symptoms.   Vasopneumatic  10 mins in elevation Rt knee medium compression 34 deg.   TODAY'S TREATMENT:                                                                      DATE: 08/19/2022 Recumbent bike Seat 7 for 5 minutes AAROM to AROM  Tailgate knee flexion 1 minute Seated knee flexion 10 x 10 seconds Seated straight leg raises 4 sets of 5 bilateral with 1# weight on ankle Standing hip abduction with Thera-band resistance (red) 2 sets of 10 x bilateral  Functional Activities: Double Leg Press 87# 10 x slow eccentrics full extension to full flexion Single Leg Press 37# 10 x slow eccentrics full extension to full flexion Step-down off 2 and 4 inch step 5 x each slow eccentrics  Vaso right knee 10 minutes medium 34*   PATIENT EDUCATION: Education details: HEP, PT plan of care Person educated: Patient Education method: Explanation, Demonstration, Verbal cues, and  Handouts Education comprehension: verbalized understanding and needs further education   HOME EXERCISE PROGRAM: Access Code: [email or web address] URL: [email or web address] Date: 08/09/2022 Prepared by: Ivery Quale  Exercises - Seated Knee Flexion Stretch  - 2 x daily - 6 x weekly - 1-2 sets - 10 reps - 5 sec hold - Seated Straight Leg Raise with Quad Contraction  - 2 x daily - 6 x weekly - 3 sets - 10 reps - Sit to Stand  - 2 x daily - 6 x weekly - 1-2 sets - 10 reps - Standing Hip Abduction with Resistance at Ankles and Counter Support  - 2 x daily - 6 x weekly - 2 sets - 10 reps - Mini Squat with Counter Support  -  2 x daily - 6 x weekly - 1-2 sets - 10 reps  ASSESSMENT:  CLINICAL IMPRESSION:  Introduction of seated pressure in Rt leg with reactive movements to help improve ability to react in driving.   Pt making good overall progress in improved WB acceptance and control.  Continued skilled PT services indicated to continue progression at this time.   OBJECTIVE IMPAIRMENTS: decreased activity tolerance, difficulty walking, decreased balance, decreased endurance, decreased mobility, decreased ROM, decreased strength, impaired flexibility, impaired LE use, and pain.  ACTIVITY LIMITATIONS: bending, lifting, carry, locomotion, cleaning, community activity, driving, and or occupation  PERSONAL FACTORS: PMH includes Rt TKA,OA, GERD, HLD, HTN, spinal stenosis. are also affecting patient's functional outcome.  REHAB POTENTIAL: Good  CLINICAL DECISION MAKING: Stable/uncomplicated  EVALUATION COMPLEXITY: Low    GOALS: Short term PT Goals Target date: 09/06/2022   Pt will be I and compliant with HEP. Baseline:  Goal status: Met 08/12/2022   Long term PT goals Target date:11/01/2022  Pt will improve knee ROM to [facility] 0-110 to improve functional mobility  Goal status: On Going 08/12/2022  Pt will improve  hip/knee strength to at least 5-/5 MMT to improve functional  strength  Goal status: Met 08/24/2022  Pt will improve FOTO to at least 55% functional to show improved function  Goal status: on going 08/22/2022  Pt will reduce pain to overall less than 2-3/10 with usual activity and work activity.  Goal status: on going 08/22/2022  Pt will be able to ambulate community distances at least 500 ft WNL gait pattern without complaints  Goal status: on going 08/22/2022  6. Pt will improve TUG time to less than 9 seconds to show improved gait speed and sit to stand ability  Goal status: on going 08/22/2022  PLAN: PT FREQUENCY: 1-3 times per week   PT DURATION: 6 weeks  PLANNED INTERVENTIONS (unless contraindicated): aquatic PT, Canalith repositioning, cryotherapy, Electrical stimulation, Iontophoresis with 4 mg/ml dexamethasome, Moist heat, traction, Ultrasound, gait training, Therapeutic exercise, balance training, neuromuscular re-education, patient/family education, prosthetic training, manual techniques, passive ROM, dry needling, taping, vasopnuematic device, vestibular, spinal manipulations, joint manipulations  PLAN FOR NEXT SESSION:  WB strengthening, compliant balance.    Chyrel Masson, PT, DPT, OCS, ATC 08/24/22  10:52 AM

## 2022-08-26 ENCOUNTER — Ambulatory Visit: Payer: Medicare PPO | Admitting: Rehabilitative and Restorative Service Providers"

## 2022-08-26 ENCOUNTER — Encounter: Payer: Self-pay | Admitting: Rehabilitative and Restorative Service Providers"

## 2022-08-26 DIAGNOSIS — R600 Localized edema: Secondary | ICD-10-CM | POA: Diagnosis not present

## 2022-08-26 DIAGNOSIS — M25561 Pain in right knee: Secondary | ICD-10-CM

## 2022-08-26 DIAGNOSIS — M25651 Stiffness of right hip, not elsewhere classified: Secondary | ICD-10-CM | POA: Diagnosis not present

## 2022-08-26 DIAGNOSIS — M6281 Muscle weakness (generalized): Secondary | ICD-10-CM

## 2022-08-26 DIAGNOSIS — R262 Difficulty in walking, not elsewhere classified: Secondary | ICD-10-CM

## 2022-08-26 NOTE — Therapy (Signed)
OUTPATIENT PHYSICAL THERAPY TREATMENT   Patient Name: Paula Conway MRN: [id number] DOB:January 30, 1955, 67 y.o., female Today's Date: 08/26/2022  END OF SESSION:  PT End of Session - 08/26/22 1016     Visit Number 9    Number of Visits 16    Date for PT Re-Evaluation 09/20/22    Authorization Type [employer]    Authorization - Visit Number 9    Authorization - Number of Visits 12    Progress Note Due on Visit 10    PT Start Time 1015    PT Stop Time 1105    PT Time Calculation (min) 50 min    Activity Tolerance Patient tolerated treatment well;No increased pain    Behavior During Therapy [place] for tasks assessed/performed               Past Medical History:  Diagnosis Date   Allergy    Arthritis    Complication of anesthesia    waking up during colonoscopy and difficulty waking up   Environmental and seasonal allergies 07/20/2015   Family history of adverse reaction to anesthesia    pt's mother and daughter have difficulty waking up   Family history of breast cancer in Mother 07/20/2015   Mother was diagnosed at 36 years old and died age 23 of breast cancer    GERD (gastroesophageal reflux disease)    Headache    History of kidney stones    Hyperlipidemia    Hypertension    stressed induced   Obesity 07/20/2015   Pre-diabetes    Spinal stenosis    Past Surgical History:  Procedure Laterality Date   APPENDECTOMY     BREAST BIOPSY Right    COLONOSCOPY     core biopsy     TOTAL KNEE ARTHROPLASTY Right 07/25/2022   Procedure: RIGHT TOTAL KNEE ARTHROPLASTY;  Surgeon: Tarry Kos, MD;  Location: [facility] OR;  Service: Orthopedics;  Laterality: Right;   VAGINA SURGERY     after having a baby   Patient Active Problem List   Diagnosis Date Noted   Status post total right knee replacement 07/25/2022   Primary osteoarthritis of right knee 07/24/2022   Dysuria 07/05/2022   Bilateral primary osteoarthritis of knee 08/12/2021   Chronic pain of right knee 08/05/2020    Statin declined 11/16/2018   Grief reaction- loss of husband [name] Aug 8th, 2020 10/02/2018   Dysfunction of both eustachian tubes 07/24/2017   BPPV (benign paroxysmal positional vertigo), unspecified laterality 07/24/2017   Vitamin D insufficiency 03/15/2017   Lumbar discogenic pain syndrome 02/15/2017   Radiculopathy, lumbar region 02/15/2017   Arthritis of facet joints at multiple vertebral levels 02/15/2017   Low back pain 02/15/2017   Obesity, Class I, BMI 30-34.9 08/30/2016   Prediabetes 07/30/2015   Elevated LDL cholesterol level 07/30/2015   Family history of breast cancer in Mother 07/20/2015   GERD (gastroesophageal reflux disease) 07/20/2015   Generalized OA 07/20/2015   Hyperlipidemia 07/20/2015   Environmental and seasonal allergies 07/20/2015   Spinal stenosis: Chronic back pain  07/20/2015   Neoplasm of connective tissue 11/23/2011    PCP: [name], PA   REFERRING PROVIDER: Tarry Kos, MD  REFERRING DIAG: M17.11 (ICD-10-CM) - Primary osteoarthritis of right knee Z96.651 (ICD-10-CM) - Status post total right knee replacement  THERAPY DIAG:  Acute pain of right knee  Stiffness of right hip, not elsewhere classified  Muscle weakness (generalized)  Difficulty in walking, not elsewhere classified  Localized edema  Rationale for Evaluation and Treatment: Rehabilitation  ONSET DATE: Rt TKA 07/25/22   SUBJECTIVE:   SUBJECTIVE STATEMENT:   Paula Conway is off prescription pain meds completely.  She was able to sleep 6+ hours uninterrupted.  Good HEP compliance continues.   PERTINENT HISTORY: PMH includes Rt TKA,OA, GERD, HLD, HTN, spinal stenosis.  PAIN:  NPRS scale: 2-4/10 this week Pain location: Rt knee  Pain description: small throb and then discomfort/tightness  Aggravating factors: prolonged postures, weight-bearing Relieving factors: rest, tylenol, exercises, ice   PRECAUTIONS: None  RED FLAGS: None   WEIGHT BEARING RESTRICTIONS:  No  FALLS:  Has patient fallen in last 6 months? No  LIVING ENVIRONMENT: 15 steps with handrail on left  OCCUPATION: works part time sitting work parking   PLOF: Independent  PATIENT GOALS: be able to drive before 6 weeks.   NEXT MD VISIT: 09/06/22  OBJECTIVE:   PATIENT SURVEYS:  08/09/2022 FOTO 43% functional intake, goal is 55%  COGNITION: 08/09/2022 Overall cognitive status: Within functional limits for tasks assessed    EDEMA:  08/09/2022 Moderate edema noted Rt knee at eval  LOWER EXTREMITY ROM:  Active ROM/PROM Right 08/09/2022 Right 08/12/2022 Right 08/17/2022 Right 08/26/2022  Hip flexion      Hip extension      Hip abduction      Hip adduction      Hip internal rotation      Hip external rotation      Knee flexion A:90 P:95 107 108 AROM in supine heel slide 114 AROM supine  Knee extension A:4 -2  -2  Ankle dorsiflexion      Ankle plantarflexion      Ankle inversion      Ankle eversion       (Blank rows = not tested)  LOWER EXTREMITY MMT:  MMT Right 08/09/2022 Left 08/09/2022  Hip flexion 4   Hip extension    Hip abduction 4   Hip adduction    Hip internal rotation    Hip external rotation    Knee flexion 4   Knee extension 4   Ankle dorsiflexion    Ankle plantarflexion    Ankle inversion    Ankle eversion     (Blank rows = not tested)   FUNCTIONAL TESTS:  08/09/2022 Timed up and go (TUG): 10:25 without AD  GAIT: 08/17/2022 :  SPC in Rt UE upon arrival.  Cues and education given for Lt UE use.   08/09/2022 Eval Comments: ambulates in with RW, but able to walk 25 feet X 2 without AD with loose supervision.                    TODAY'S TREATMENT:                                                                      DATE:  08/26/2022 Recumbent bike Seat 7 for 5 minutes AAROM to AROM  Tailgate knee flexion 1 minute Seated knee flexion 10 x 10 seconds Seated straight leg raises 4 sets of 5 bilateral with 2# weight on ankle Standing hip abduction  with Thera-band resistance (red) 2 sets of 10 x bilateral  Functional Activities: Double Leg Press 100# 15 x slow eccentrics full extension to full  flexion Single Leg Press 43# 2 sets of 10 x slow eccentrics full extension to full flexion Step-up and over 6 inch step 15 x each side slow eccentrics  Vaso right knee 10 minutes medium 34*   08/24/2022 Therex UBE UE/LE 8 mins seat 9 for ROM Leg press double leg 93 lbs x 15, single leg 37 lbs 2 x 15 Rt leg  Gastroc incline stretch bilateral 30 sec x 3 Seated Rt LAQ with end range pauses 1-2 sec 5 lbs 2 x 15   TherActivity Step on over and down WB on Rt leg 4 inch step x 15 with single hand assist on bar Seated driving reaction pressure into bosu ball with blazepod reactive lights 2 second timeout 30 seconds x 4 , 15 sec rest breaks      Vasopneumatic  10 mins in elevation Rt knee medium compression 34 deg.     08/22/2022 Therex UBE UE/LE 8 mins seat 9 for ROM Leg press double leg 87 lbs x 15, single leg 37 lbs 2 x 15 Rt leg  Gastroc incline stretch bilateral 30 sec x 3 Seated Rt LAQ with end range pauses 1-2 sec 4 lbs 2 x 15   Neuro Re-ed Tandem stance 1 min x 2 bilateral with occasional hand touch on bar 6 inch hurdle step to pattern over 10 ft x 4 leading each leg in // bars with occasional HHA    Manual Supine Rt knee flexion with distraction/IR mobilization c movement for symptoms.    Vasopneumatic  10 mins in elevation Rt knee medium compression 34 deg.     PATIENT EDUCATION: Education details: HEP, PT plan of care Person educated: Patient Education method: Explanation, Demonstration, Verbal cues, and Handouts Education comprehension: verbalized understanding and needs further education   HOME EXERCISE PROGRAM: Access Code: [email or web address] URL: [email or web address] Date: 08/09/2022 Prepared by: Ivery Quale  Exercises - Seated Knee Flexion Stretch  - 2 x daily - 6 x weekly - 1-2 sets - 10 reps - 5 sec  hold - Seated Straight Leg Raise with Quad Contraction  - 2 x daily - 6 x weekly - 3 sets - 10 reps - Sit to Stand  - 2 x daily - 6 x weekly - 1-2 sets - 10 reps - Standing Hip Abduction with Resistance at Ankles and Counter Support  - 2 x daily - 6 x weekly - 2 sets - 10 reps - Mini Squat with Counter Support  - 2 x daily - 6 x weekly - 1-2 sets - 10 reps  ASSESSMENT:  CLINICAL IMPRESSION:  Valeska continues to do a great job with her home and supervised PT.  AROM was 2 - 0 - 114 degrees today.  As quadriceps strength continues to improve, edema will decrease and allow improved AROM.  Continue current plan with increased functional emphasis to meet long-term goals.  OBJECTIVE IMPAIRMENTS: decreased activity tolerance, difficulty walking, decreased balance, decreased endurance, decreased mobility, decreased ROM, decreased strength, impaired flexibility, impaired LE use, and pain.  ACTIVITY LIMITATIONS: bending, lifting, carry, locomotion, cleaning, community activity, driving, and or occupation  PERSONAL FACTORS: PMH includes Rt TKA,OA, GERD, HLD, HTN, spinal stenosis. are also affecting patient's functional outcome.  REHAB POTENTIAL: Good  CLINICAL DECISION MAKING: Stable/uncomplicated  EVALUATION COMPLEXITY: Low    GOALS: Short term PT Goals Target date: 09/06/2022   Pt will be I and compliant with HEP. Baseline:  Goal status: Met 08/12/2022   Long term PT goals Target date:11/01/2022  Pt will  improve knee ROM to [facility] 0-110 to improve functional mobility  Goal status: Partially Met 08/26/2022  Pt will improve  hip/knee strength to at least 5-/5 MMT to improve functional strength  Goal status: New  Pt will improve FOTO to at least 55% functional to show improved function  Goal status: New  Pt will reduce pain to overall less than 2-3/10 with usual activity and work activity.  Goal status: On Going 08/26/2022  Pt will be able to ambulate community distances at least 500  ft WNL gait pattern without complaints  Goal status: On Going 08/26/2022  6. Pt will improve TUG time to less than 9 seconds to show improved gait speed and sit to stand ability  Goal status: NEW  PLAN: PT FREQUENCY: 1-3 times per week   PT DURATION: 6 weeks  PLANNED INTERVENTIONS (unless contraindicated): aquatic PT, Canalith repositioning, cryotherapy, Electrical stimulation, Iontophoresis with 4 mg/ml dexamethasome, Moist heat, traction, Ultrasound, gait training, Therapeutic exercise, balance training, neuromuscular re-education, patient/family education, prosthetic training, manual techniques, passive ROM, dry needling, taping, vasopnuematic device, vestibular, spinal manipulations, joint manipulations  PLAN FOR NEXT SESSION:   Improve right knee AROM, edema reduction, quadriceps strengthening as appropriate.  Balance and functional progressions, particularly descending stairs.   Cherlyn Cushing PT, MPT 08/26/22  10:58 AM

## 2022-08-29 ENCOUNTER — Encounter: Payer: Self-pay | Admitting: Physical Therapy

## 2022-08-29 ENCOUNTER — Encounter: Payer: Self-pay | Admitting: Orthopaedic Surgery

## 2022-08-29 ENCOUNTER — Ambulatory Visit: Payer: Medicare PPO | Admitting: Physical Therapy

## 2022-08-29 DIAGNOSIS — R262 Difficulty in walking, not elsewhere classified: Secondary | ICD-10-CM

## 2022-08-29 DIAGNOSIS — M6281 Muscle weakness (generalized): Secondary | ICD-10-CM

## 2022-08-29 DIAGNOSIS — M25561 Pain in right knee: Secondary | ICD-10-CM

## 2022-08-29 DIAGNOSIS — M25651 Stiffness of right hip, not elsewhere classified: Secondary | ICD-10-CM

## 2022-08-29 DIAGNOSIS — R600 Localized edema: Secondary | ICD-10-CM | POA: Diagnosis not present

## 2022-08-29 NOTE — Therapy (Addendum)
OUTPATIENT PHYSICAL THERAPY TREATMENT/Progress note Progress Note reporting period 08/09/22 to 08/29/22  See below for objective and subjective measurements relating to patients progress with PT.    Patient Name: Paula Conway MRN: [id number] DOB:1955/06/20, 67 y.o., female Today's Date: 08/29/2022  END OF SESSION:  PT End of Session - 08/29/22 1010     Visit Number 10    Number of Visits 16    Date for PT Re-Evaluation 09/20/22    Authorization Type [employer]    Authorization - Visit Number 10    Authorization - Number of Visits 12    Progress Note Due on Visit 10    PT Start Time 1010    PT Stop Time 1100    PT Time Calculation (min) 50 min    Activity Tolerance Patient tolerated treatment well;No increased pain    Behavior During Therapy [facility] for tasks assessed/performed               Past Medical History:  Diagnosis Date   Allergy    Arthritis    Complication of anesthesia    waking up during colonoscopy and difficulty waking up   Environmental and seasonal allergies 07/20/2015   Family history of adverse reaction to anesthesia    pt's mother and daughter have difficulty waking up   Family history of breast cancer in Mother 07/20/2015   Mother was diagnosed at 31 years old and died age 60 of breast cancer    GERD (gastroesophageal reflux disease)    Headache    History of kidney stones    Hyperlipidemia    Hypertension    stressed induced   Obesity 07/20/2015   Pre-diabetes    Spinal stenosis    Past Surgical History:  Procedure Laterality Date   APPENDECTOMY     BREAST BIOPSY Right    COLONOSCOPY     core biopsy     TOTAL KNEE ARTHROPLASTY Right 07/25/2022   Procedure: RIGHT TOTAL KNEE ARTHROPLASTY;  Surgeon: Tarry Kos, MD;  Location: [facility] OR;  Service: Orthopedics;  Laterality: Right;   VAGINA SURGERY     after having a baby   Patient Active Problem List   Diagnosis Date Noted   Status post total right knee replacement 07/25/2022   Primary  osteoarthritis of right knee 07/24/2022   Dysuria 07/05/2022   Bilateral primary osteoarthritis of knee 08/12/2021   Chronic pain of right knee 08/05/2020   Statin declined 11/16/2018   Grief reaction- loss of husband [name] Aug 8th, 2020 10/02/2018   Dysfunction of both eustachian tubes 07/24/2017   BPPV (benign paroxysmal positional vertigo), unspecified laterality 07/24/2017   Vitamin D insufficiency 03/15/2017   Lumbar discogenic pain syndrome 02/15/2017   Radiculopathy, lumbar region 02/15/2017   Arthritis of facet joints at multiple vertebral levels 02/15/2017   Low back pain 02/15/2017   Obesity, Class I, BMI 30-34.9 08/30/2016   Prediabetes 07/30/2015   Elevated LDL cholesterol level 07/30/2015   Family history of breast cancer in Mother 07/20/2015   GERD (gastroesophageal reflux disease) 07/20/2015   Generalized OA 07/20/2015   Hyperlipidemia 07/20/2015   Environmental and seasonal allergies 07/20/2015   Spinal stenosis: Chronic back pain  07/20/2015   Neoplasm of connective tissue 11/23/2011    PCP: [name], PA   REFERRING PROVIDER: Tarry Kos, MD  REFERRING DIAG: M17.11 (ICD-10-CM) - Primary osteoarthritis of right knee Z96.651 (ICD-10-CM) - Status post total right knee replacement  THERAPY DIAG:  Acute pain of right knee  Stiffness of right hip, not elsewhere classified  Muscle weakness (generalized)  Difficulty in walking, not elsewhere classified  Localized edema  Rationale for Evaluation and Treatment: Rehabilitation  ONSET DATE: Rt TKA 07/25/22   SUBJECTIVE:   SUBJECTIVE STATEMENT: She is not taking any pain meds and overall less than 3/10 pain overall to meet her long term goal for this. She does ask about driving and I encouraged her to reach out to MD for clearance for this, I do feel she is ready to drive based on testing today.   PERTINENT HISTORY: PMH includes Rt TKA,OA, GERD, HLD, HTN, spinal stenosis.  PAIN:  NPRS scale:  2-4/10 this week Pain location: Rt knee  Pain description: small throb and then discomfort/tightness  Aggravating factors: prolonged postures, weight-bearing Relieving factors: rest, tylenol, exercises, ice   PRECAUTIONS: None  RED FLAGS: None   WEIGHT BEARING RESTRICTIONS: No  FALLS:  Has patient fallen in last 6 months? No  LIVING ENVIRONMENT: 15 steps with handrail on left  OCCUPATION: works part time sitting work parking   PLOF: Independent  PATIENT GOALS: be able to drive before 6 weeks.   NEXT MD VISIT: 09/06/22  OBJECTIVE:   PATIENT SURVEYS:  08/09/2022 FOTO 43% functional intake, goal is 55% 08/29/22 FOTO improved to 61% and met goal for this  COGNITION: 08/09/2022 Overall cognitive status: Within functional limits for tasks assessed    EDEMA:  08/09/2022 Moderate edema noted Rt knee at eval  LOWER EXTREMITY ROM:  Active ROM/PROM Right 08/09/2022 Right 08/12/2022 Right 08/17/2022 Right 08/26/2022 Right 08/29/22  Hip flexion       Hip extension       Hip abduction       Hip adduction       Hip internal rotation       Hip external rotation       Knee flexion A:90 P:95 107 108 AROM in supine heel slide 114 AROM supine 115 AROM  Knee extension A:4 -2  -2 4 AROM  Ankle dorsiflexion       Ankle plantarflexion       Ankle inversion       Ankle eversion        (Blank rows = not tested)  LOWER EXTREMITY MMT:  MMT in sitting Right 08/09/2022 Right 08/29/22  Hip flexion 4 4+  Hip extension    Hip abduction 4 5  Hip adduction    Hip internal rotation    Hip external rotation    Knee flexion 4 5  Knee extension 4 5  Ankle dorsiflexion    Ankle plantarflexion    Ankle inversion    Ankle eversion     (Blank rows = not tested)   FUNCTIONAL TESTS:  08/09/2022 Timed up and go (TUG): 10:25 without AD  GAIT: 08/28/22: Gait 500 feet independently in clinic  08/17/2022 :  SPC in Rt UE upon arrival.  Cues and education given for Lt UE use.    08/09/2022 Eval Comments: ambulates in with RW, but able to walk 25 feet X 2 without AD with loose supervision.                    TODAY'S TREATMENT:  DATE:  08/29/2022 Recumbent bike Seat 7 for 8 minutes  Updated FOTO, measurements and goals Supine heel prop with quad sets X10 Discussed walking program with gentle progressions, gym activity after discharge from PT. Discussed overall healing times   Functional Activities: Walked 500 feet in clinic [facility] gait pattern to meet long term goal for this Seated blaze pods to simulate reaction time with driving 30 sec X 3 used 2 pods with 2 different colors 30 sec X 1, then 3 pods with one color. Observed good overall reaction time for this needed for driving Double Leg Press 409# 15 x slow eccentrics full extension to full flexion Single Leg Press 43# 2 sets of 10 x slow eccentrics full extension to full flexion   08/26/2022 Recumbent bike Seat 7 for 5 minutes AAROM to AROM  Tailgate knee flexion 1 minute Seated knee flexion 10 x 10 seconds Seated straight leg raises 4 sets of 5 bilateral with 2# weight on ankle Standing hip abduction with Thera-band resistance (red) 2 sets of 10 x bilateral  Functional Activities: Double Leg Press 100# 15 x slow eccentrics full extension to full flexion Single Leg Press 43# 2 sets of 10 x slow eccentrics full extension to full flexion Step-up and over 6 inch step 15 x each side slow eccentrics  Vaso right knee 10 minutes medium 34*   PATIENT EDUCATION: Education details: HEP, PT plan of care Person educated: Patient Education method: Explanation, Demonstration, Verbal cues, and Handouts Education comprehension: verbalized understanding and needs further education   HOME EXERCISE PROGRAM: Access Code: [email or web address] URL: [email or web address] Date: 08/09/2022 Prepared by: Ivery Quale  Exercises - Seated Knee Flexion  Stretch  - 2 x daily - 6 x weekly - 1-2 sets - 10 reps - 5 sec hold - Seated Straight Leg Raise with Quad Contraction  - 2 x daily - 6 x weekly - 3 sets - 10 reps - Sit to Stand  - 2 x daily - 6 x weekly - 1-2 sets - 10 reps - Standing Hip Abduction with Resistance at Ankles and Counter Support  - 2 x daily - 6 x weekly - 2 sets - 10 reps - Mini Squat with Counter Support  - 2 x daily - 6 x weekly - 1-2 sets - 10 reps  ASSESSMENT:  CLINICAL IMPRESSION:  She has made good overall progress up to this point and has met most of her PT goals now. She has 2 more visits left and then we will reassess her readiness to transition to independent program. I do feel she is ready to drive now based on simulations in clinic today and she was encouraged to reach out to MD for his permission for this. She would benefit from further extension ROM focus and functional quad strengthening.   OBJECTIVE IMPAIRMENTS: decreased activity tolerance, difficulty walking, decreased balance, decreased endurance, decreased mobility, decreased ROM, decreased strength, impaired flexibility, impaired LE use, and pain.  ACTIVITY LIMITATIONS: bending, lifting, carry, locomotion, cleaning, community activity, driving, and or occupation  PERSONAL FACTORS: PMH includes Rt TKA,OA, GERD, HLD, HTN, spinal stenosis. are also affecting patient's functional outcome.  REHAB POTENTIAL: Good  CLINICAL DECISION MAKING: Stable/uncomplicated  EVALUATION COMPLEXITY: Low    GOALS: Short term PT Goals Target date: 09/06/2022   Pt will be I and compliant with HEP. Baseline:  Goal status: Met 08/12/2022   Long term PT goals Target date:11/01/2022  Pt will improve knee ROM to [facility] 0-110 to improve functional mobility  Goal status:  Partially Met 08/26/2022  Pt will improve  hip/knee strength to at least 5-/5 MMT to improve functional strength  Goal status: partially met 08/29/22  Pt will improve FOTO to at least 55% functional to show  improved function  Goal status: Met 08/29/22  Pt will reduce pain to overall less than 2-3/10 with usual activity and work activity.  Goal status: MET 08/29/22  Pt will be able to ambulate community distances at least 500 ft WNL gait pattern without complaints  Goal status: MET 08/29/22  6. Pt will improve TUG time to less than 9 seconds to show improved gait speed and sit to stand ability  Goal status: ongoing, 08/29/22  PLAN: PT FREQUENCY: 1-3 times per week   PT DURATION: 6 weeks  PLANNED INTERVENTIONS (unless contraindicated): aquatic PT, Canalith repositioning, cryotherapy, Electrical stimulation, Iontophoresis with 4 mg/ml dexamethasome, Moist heat, traction, Ultrasound, gait training, Therapeutic exercise, balance training, neuromuscular re-education, patient/family education, prosthetic training, manual techniques, passive ROM, dry needling, taping, vasopnuematic device, vestibular, spinal manipulations, joint manipulations  PLAN FOR NEXT SESSION:   2 more visits then reassess readiness to DC, focus on knee extension ROM and functional strength and stairs   Ivery Quale, PT, DPT 08/29/22 10:41 AM

## 2022-08-31 ENCOUNTER — Ambulatory Visit: Payer: Medicare PPO | Admitting: Physical Therapy

## 2022-08-31 ENCOUNTER — Encounter: Payer: Self-pay | Admitting: Physical Therapy

## 2022-08-31 DIAGNOSIS — R262 Difficulty in walking, not elsewhere classified: Secondary | ICD-10-CM

## 2022-08-31 DIAGNOSIS — R600 Localized edema: Secondary | ICD-10-CM

## 2022-08-31 DIAGNOSIS — M25651 Stiffness of right hip, not elsewhere classified: Secondary | ICD-10-CM | POA: Diagnosis not present

## 2022-08-31 DIAGNOSIS — M25561 Pain in right knee: Secondary | ICD-10-CM | POA: Diagnosis not present

## 2022-08-31 DIAGNOSIS — M6281 Muscle weakness (generalized): Secondary | ICD-10-CM

## 2022-08-31 NOTE — Therapy (Signed)
OUTPATIENT PHYSICAL THERAPY TREATMENT    Patient Name: Paula Conway MRN: [id number] DOB:1955-10-01, 67 y.o., female Today's Date: 08/31/2022  END OF SESSION:  PT End of Session - 08/31/22 1005     Visit Number 11    Number of Visits 16    Date for PT Re-Evaluation 09/20/22    Authorization Type [employer]    Authorization - Visit Number 11    Authorization - Number of Visits 12    Progress Note Due on Visit 10    PT Start Time 1005    PT Stop Time 1050    PT Time Calculation (min) 45 min    Activity Tolerance Patient tolerated treatment well;No increased pain    Behavior During Therapy [facility] for tasks assessed/performed               Past Medical History:  Diagnosis Date   Allergy    Arthritis    Complication of anesthesia    waking up during colonoscopy and difficulty waking up   Environmental and seasonal allergies 07/20/2015   Family history of adverse reaction to anesthesia    pt's mother and daughter have difficulty waking up   Family history of breast cancer in Mother 07/20/2015   Mother was diagnosed at 12 years old and died age 55 of breast cancer    GERD (gastroesophageal reflux disease)    Headache    History of kidney stones    Hyperlipidemia    Hypertension    stressed induced   Obesity 07/20/2015   Pre-diabetes    Spinal stenosis    Past Surgical History:  Procedure Laterality Date   APPENDECTOMY     BREAST BIOPSY Right    COLONOSCOPY     core biopsy     TOTAL KNEE ARTHROPLASTY Right 07/25/2022   Procedure: RIGHT TOTAL KNEE ARTHROPLASTY;  Surgeon: Tarry Kos, MD;  Location: [facility] OR;  Service: Orthopedics;  Laterality: Right;   VAGINA SURGERY     after having a baby   Patient Active Problem List   Diagnosis Date Noted   Status post total right knee replacement 07/25/2022   Primary osteoarthritis of right knee 07/24/2022   Dysuria 07/05/2022   Bilateral primary osteoarthritis of knee 08/12/2021   Chronic pain of right knee 08/05/2020    Statin declined 11/16/2018   Grief reaction- loss of husband [name] Aug 8th, 2020 10/02/2018   Dysfunction of both eustachian tubes 07/24/2017   BPPV (benign paroxysmal positional vertigo), unspecified laterality 07/24/2017   Vitamin D insufficiency 03/15/2017   Lumbar discogenic pain syndrome 02/15/2017   Radiculopathy, lumbar region 02/15/2017   Arthritis of facet joints at multiple vertebral levels 02/15/2017   Low back pain 02/15/2017   Obesity, Class I, BMI 30-34.9 08/30/2016   Prediabetes 07/30/2015   Elevated LDL cholesterol level 07/30/2015   Family history of breast cancer in Mother 07/20/2015   GERD (gastroesophageal reflux disease) 07/20/2015   Generalized OA 07/20/2015   Hyperlipidemia 07/20/2015   Environmental and seasonal allergies 07/20/2015   Spinal stenosis: Chronic back pain  07/20/2015   Neoplasm of connective tissue 11/23/2011    PCP: [name], PA   REFERRING PROVIDER: Tarry Kos, MD  REFERRING DIAG: M17.11 (ICD-10-CM) - Primary osteoarthritis of right knee Z96.651 (ICD-10-CM) - Status post total right knee replacement  THERAPY DIAG:  Acute pain of right knee  Stiffness of right hip, not elsewhere classified  Muscle weakness (generalized)  Difficulty in walking, not elsewhere classified  Localized edema  Rationale for Evaluation and Treatment: Rehabilitation  ONSET DATE: Rt TKA 07/25/22   SUBJECTIVE:   SUBJECTIVE STATEMENT: She has some stiffness and pain with stretching, otherwise doing well. She does want to know if she can get in the pool.    PERTINENT HISTORY: PMH includes Rt TKA,OA, GERD, HLD, HTN, spinal stenosis.  PAIN:  NPRS scale: 2-4/10 this week Pain location: Rt knee  Pain description: small throb and then discomfort/tightness  Aggravating factors: prolonged postures, weight-bearing Relieving factors: rest, tylenol, exercises, ice   PRECAUTIONS: None  RED FLAGS: None   WEIGHT BEARING RESTRICTIONS: No  FALLS:   Has patient fallen in last 6 months? No  LIVING ENVIRONMENT: 15 steps with handrail on left  OCCUPATION: works part time sitting work parking   PLOF: Independent  PATIENT GOALS: be able to drive before 6 weeks.   NEXT MD VISIT: 09/06/22  OBJECTIVE:   PATIENT SURVEYS:  08/09/2022 FOTO 43% functional intake, goal is 55% 08/29/22 FOTO improved to 61% and met goal for this  COGNITION: 08/09/2022 Overall cognitive status: Within functional limits for tasks assessed    EDEMA:  08/09/2022 Moderate edema noted Rt knee at eval  LOWER EXTREMITY ROM:  Active ROM/PROM Right 08/09/2022 Right 08/12/2022 Right 08/17/2022 Right 08/26/2022 Right 08/29/22  Hip flexion       Hip extension       Hip abduction       Hip adduction       Hip internal rotation       Hip external rotation       Knee flexion A:90 P:95 107 108 AROM in supine heel slide 114 AROM supine 115 AROM  Knee extension A:4 -2  -2 4 AROM  Ankle dorsiflexion       Ankle plantarflexion       Ankle inversion       Ankle eversion        (Blank rows = not tested)  LOWER EXTREMITY MMT:  MMT in sitting Right 08/09/2022 Right 08/29/22  Hip flexion 4 4+  Hip extension    Hip abduction 4 5  Hip adduction    Hip internal rotation    Hip external rotation    Knee flexion 4 5  Knee extension 4 5  Ankle dorsiflexion    Ankle plantarflexion    Ankle inversion    Ankle eversion     (Blank rows = not tested)   FUNCTIONAL TESTS:  08/09/2022 Timed up and go (TUG): 10:25 without AD  GAIT: 08/28/22: Gait 500 feet independently in clinic  08/17/2022 :  SPC in Rt UE upon arrival.  Cues and education given for Lt UE use.   08/09/2022 Eval Comments: ambulates in with RW, but able to walk 25 feet X 2 without AD with loose supervision.                    TODAY'S TREATMENT:                                                                      DATE:  08/31/2022 Recumbent bike Seat 7 for 10 minutes  Supine heel prop with quad sets  2 X10 Supine heelslides 5 sec X 10 Standing gastroc stretch 30 sec X  3 bilat    Functional Activities: Discussed and educated on gym equipment to use post discharge  Leg extension machine 5# up with both and down with one. 2X10 Leg curl machine 25# DL [id number] Double Leg Press 062# 15 x slow eccentrics full extension to full flexion Single Leg Press 43# 2 sets of 10 x slow eccentrics full extension to full flexion Updated and printed out HEP revision and aquatic PT information  08/29/2022 Recumbent bike Seat 7 for 8 minutes  Updated FOTO, measurements and goals Supine heel prop with quad sets X10 Discussed walking program with gentle progressions, gym activity after discharge from PT. Discussed overall healing times   Functional Activities: Walked 500 feet in clinic [facility] gait pattern to meet long term goal for this Seated blaze pods to simulate reaction time with driving 30 sec X 3 used 2 pods with 2 different colors 30 sec X 1, then 3 pods with one color. Observed good overall reaction time for this needed for driving Double Leg Press 694# 15 x slow eccentrics full extension to full flexion Single Leg Press 43# 2 sets of 10 x slow eccentrics full extension to full flexion   08/26/2022 Recumbent bike Seat 7 for 5 minutes AAROM to AROM  Tailgate knee flexion 1 minute Seated knee flexion 10 x 10 seconds Seated straight leg raises 4 sets of 5 bilateral with 2# weight on ankle Standing hip abduction with Thera-band resistance (red) 2 sets of 10 x bilateral  Functional Activities: Double Leg Press 100# 15 x slow eccentrics full extension to full flexion Single Leg Press 43# 2 sets of 10 x slow eccentrics full extension to full flexion Step-up and over 6 inch step 15 x each side slow eccentrics  Vaso right knee 10 minutes medium 34*   PATIENT EDUCATION: Education details: HEP, PT plan of care Person educated: Patient Education method: Explanation, Demonstration, Verbal cues, and  Handouts Education comprehension: verbalized understanding and needs further education   HOME EXERCISE PROGRAM: Access Code: [email or web address] URL: [email or web address] Date: 08/31/2022 Prepared by: Ivery Quale  Exercises - Supine Heel Slide with Strap  - 2 x daily - 6 x weekly - 1-2 sets - 10 reps - 5 hold - Heel Prop  - 2 x daily - 6 x weekly - 1 sets - 1 reps - 5-10 min hold - Seated Straight Leg Raise with Quad Contraction  - 2 x daily - 6 x weekly - 3 sets - 10 reps - Sit to Stand  - 2 x daily - 6 x weekly - 1-2 sets - 10 reps - Standing Gastroc Stretch at Counter  - 2 x daily - 6 x weekly - 1 sets - 3 reps - 30 hold - Full Leg Press  - 2-3 x weekly - 2-3 sets - 10-15 reps - Hamstring Curl with Weight Machine  - 2-3 x weekly - 2-3 sets - 10-15 reps - Knee Extension with Weight Machine  - 2-3 x weekly - 2-3 sets - 10-15 reps - Recumbent Bike  - 2-3 x weekly  Patient Education - [facility]  ASSESSMENT:  CLINICAL IMPRESSION:  Session focused on education and implementation of using gym equipment for strengthening that she can start to do at her community gym. She is also interested in water exercise so we will set her up for one aquatic PT visit to show her HEP for this as well.   OBJECTIVE IMPAIRMENTS: decreased activity tolerance, difficulty walking, decreased balance, decreased endurance, decreased mobility, decreased  ROM, decreased strength, impaired flexibility, impaired LE use, and pain.  ACTIVITY LIMITATIONS: bending, lifting, carry, locomotion, cleaning, community activity, driving, and or occupation  PERSONAL FACTORS: PMH includes Rt TKA,OA, GERD, HLD, HTN, spinal stenosis. are also affecting patient's functional outcome.  REHAB POTENTIAL: Good  CLINICAL DECISION MAKING: Stable/uncomplicated  EVALUATION COMPLEXITY: Low    GOALS: Short term PT Goals Target date: 09/06/2022   Pt will be I and compliant with HEP. Baseline:  Goal status: Met  08/12/2022   Long term PT goals Target date:11/01/2022  Pt will improve knee ROM to [facility] 0-110 to improve functional mobility  Goal status: Partially Met 08/26/2022  Pt will improve  hip/knee strength to at least 5-/5 MMT to improve functional strength  Goal status: partially met 08/29/22  Pt will improve FOTO to at least 55% functional to show improved function  Goal status: Met 08/29/22  Pt will reduce pain to overall less than 2-3/10 with usual activity and work activity.  Goal status: MET 08/29/22  Pt will be able to ambulate community distances at least 500 ft WNL gait pattern without complaints  Goal status: MET 08/29/22  6. Pt will improve TUG time to less than 9 seconds to show improved gait speed and sit to stand ability  Goal status: ongoing, 08/29/22  PLAN: PT FREQUENCY: 1-3 times per week   PT DURATION: 6 weeks  PLANNED INTERVENTIONS (unless contraindicated): aquatic PT, Canalith repositioning, cryotherapy, Electrical stimulation, Iontophoresis with 4 mg/ml dexamethasome, Moist heat, traction, Ultrasound, gait training, Therapeutic exercise, balance training, neuromuscular re-education, patient/family education, prosthetic training, manual techniques, passive ROM, dry needling, taping, vasopnuematic device, vestibular, spinal manipulations, joint manipulations  PLAN FOR NEXT SESSION:   try water PT next   Ivery Quale, PT, DPT 08/31/22 10:06 AM

## 2022-09-01 ENCOUNTER — Ambulatory Visit: Payer: Medicare PPO

## 2022-09-01 ENCOUNTER — Ambulatory Visit (INDEPENDENT_AMBULATORY_CARE_PROVIDER_SITE_OTHER): Payer: Medicare PPO | Admitting: Family Medicine

## 2022-09-01 DIAGNOSIS — R82998 Other abnormal findings in urine: Secondary | ICD-10-CM

## 2022-09-01 DIAGNOSIS — R319 Hematuria, unspecified: Secondary | ICD-10-CM | POA: Diagnosis not present

## 2022-09-01 DIAGNOSIS — R300 Dysuria: Secondary | ICD-10-CM

## 2022-09-01 LAB — POCT URINALYSIS DIP (CLINITEK)
Bilirubin, UA: NEGATIVE
Glucose, UA: NEGATIVE mg/dL
Ketones, POC UA: NEGATIVE mg/dL
Nitrite, UA: NEGATIVE
POC PROTEIN,UA: 30 — AB
Spec Grav, UA: 1.02 (ref 1.010–1.025)
Urobilinogen, UA: 0.2 E.U./dL
pH, UA: 7 (ref 5.0–8.0)

## 2022-09-01 MED ORDER — SULFAMETHOXAZOLE-TRIMETHOPRIM 800-160 MG PO TABS
1.0000 | ORAL_TABLET | Freq: Two times a day (BID) | ORAL | 0 refills | Status: AC
Start: 2022-09-01 — End: 2022-09-04

## 2022-09-01 NOTE — Progress Notes (Signed)
SUBJECTIVE: Paula Conway is a 67 y.o. female who complains of urinary frequency, urgency and dysuria x 5 days, without flank pain, fever, chills, or abnormal vaginal discharge or bleeding.    Urine dipstick shows positive for RBC's, positive for protein, and positive for leukocytes.

## 2022-09-01 NOTE — Addendum Note (Signed)
Addended by: Saralyn Pilar on: 09/01/2022 02:14 PM   Modules accepted: Orders

## 2022-09-01 NOTE — Addendum Note (Signed)
Addended by: Tonny Bollman on: 09/01/2022 11:23 AM   Modules accepted: Orders

## 2022-09-02 ENCOUNTER — Ambulatory Visit (INDEPENDENT_AMBULATORY_CARE_PROVIDER_SITE_OTHER): Payer: Medicare PPO | Admitting: Physical Therapy

## 2022-09-02 ENCOUNTER — Encounter: Payer: Self-pay | Admitting: Physical Therapy

## 2022-09-02 DIAGNOSIS — M6281 Muscle weakness (generalized): Secondary | ICD-10-CM

## 2022-09-02 DIAGNOSIS — R600 Localized edema: Secondary | ICD-10-CM

## 2022-09-02 DIAGNOSIS — M25561 Pain in right knee: Secondary | ICD-10-CM

## 2022-09-02 DIAGNOSIS — M25651 Stiffness of right hip, not elsewhere classified: Secondary | ICD-10-CM

## 2022-09-02 DIAGNOSIS — R262 Difficulty in walking, not elsewhere classified: Secondary | ICD-10-CM

## 2022-09-02 NOTE — Therapy (Signed)
OUTPATIENT PHYSICAL THERAPY TREATMENT    Patient Name: Paula Conway MRN: [id number] DOB:1955-03-19, 67 y.o., female Today's Date: 09/02/2022  END OF SESSION:  PT End of Session - 09/02/22 0850     Visit Number 12    Number of Visits 16    Date for PT Re-Evaluation 09/20/22    Authorization Type [employer]    Authorization - Visit Number 12    Authorization - Number of Visits 12    Progress Note Due on Visit 10    PT Start Time 0840    PT Stop Time 0925    PT Time Calculation (min) 45 min    Activity Tolerance Patient tolerated treatment well;No increased pain    Behavior During Therapy [facility] for tasks assessed/performed               Past Medical History:  Diagnosis Date   Allergy    Arthritis    Complication of anesthesia    waking up during colonoscopy and difficulty waking up   Environmental and seasonal allergies 07/20/2015   Family history of adverse reaction to anesthesia    pt's mother and daughter have difficulty waking up   Family history of breast cancer in Mother 07/20/2015   Mother was diagnosed at 35 years old and died age 30 of breast cancer    GERD (gastroesophageal reflux disease)    Headache    History of kidney stones    Hyperlipidemia    Hypertension    stressed induced   Obesity 07/20/2015   Pre-diabetes    Spinal stenosis    Past Surgical History:  Procedure Laterality Date   APPENDECTOMY     BREAST BIOPSY Right    COLONOSCOPY     core biopsy     TOTAL KNEE ARTHROPLASTY Right 07/25/2022   Procedure: RIGHT TOTAL KNEE ARTHROPLASTY;  Surgeon: Tarry Kos, MD;  Location: [facility] OR;  Service: Orthopedics;  Laterality: Right;   VAGINA SURGERY     after having a baby   Patient Active Problem List   Diagnosis Date Noted   Status post total right knee replacement 07/25/2022   Primary osteoarthritis of right knee 07/24/2022   Dysuria 07/05/2022   Bilateral primary osteoarthritis of knee 08/12/2021   Chronic pain of right knee 08/05/2020    Statin declined 11/16/2018   Grief reaction- loss of husband [name] Aug 8th, 2020 10/02/2018   Dysfunction of both eustachian tubes 07/24/2017   BPPV (benign paroxysmal positional vertigo), unspecified laterality 07/24/2017   Vitamin D insufficiency 03/15/2017   Lumbar discogenic pain syndrome 02/15/2017   Radiculopathy, lumbar region 02/15/2017   Arthritis of facet joints at multiple vertebral levels 02/15/2017   Low back pain 02/15/2017   Obesity, Class I, BMI 30-34.9 08/30/2016   Prediabetes 07/30/2015   Elevated LDL cholesterol level 07/30/2015   Family history of breast cancer in Mother 07/20/2015   GERD (gastroesophageal reflux disease) 07/20/2015   Generalized OA 07/20/2015   Hyperlipidemia 07/20/2015   Environmental and seasonal allergies 07/20/2015   Spinal stenosis: Chronic back pain  07/20/2015   Neoplasm of connective tissue 11/23/2011    PCP: [name], PA   REFERRING PROVIDER: Tarry Kos, MD  REFERRING DIAG: M17.11 (ICD-10-CM) - Primary osteoarthritis of right knee Z96.651 (ICD-10-CM) - Status post total right knee replacement  THERAPY DIAG:  Acute pain of right knee  Stiffness of right hip, not elsewhere classified  Muscle weakness (generalized)  Difficulty in walking, not elsewhere classified  Localized edema  Rationale for Evaluation and Treatment: Rehabilitation  ONSET DATE: Rt TKA 07/25/22   SUBJECTIVE:   SUBJECTIVE STATEMENT: She relays some tightness in her knee, she was having pain last night and almost had to take a pain pill but did not.    PERTINENT HISTORY: PMH includes Rt TKA,OA, GERD, HLD, HTN, spinal stenosis.  PAIN:  NPRS scale: 2/10 today Pain location: Rt knee  Pain description: small throb and then discomfort/tightness  Aggravating factors: prolonged postures, weight-bearing Relieving factors: rest, tylenol, exercises, ice   PRECAUTIONS: None  RED FLAGS: None   WEIGHT BEARING RESTRICTIONS: No  FALLS:  Has  patient fallen in last 6 months? No  LIVING ENVIRONMENT: 15 steps with handrail on left  OCCUPATION: works part time sitting work parking   PLOF: Independent  PATIENT GOALS: be able to drive before 6 weeks.   NEXT MD VISIT: 09/06/22  OBJECTIVE:   PATIENT SURVEYS:  08/09/2022 FOTO 43% functional intake, goal is 55% 08/29/22 FOTO improved to 61% and met goal for this  COGNITION: 08/09/2022 Overall cognitive status: Within functional limits for tasks assessed    EDEMA:  08/09/2022 Moderate edema noted Rt knee at eval  LOWER EXTREMITY ROM:  Active ROM/PROM Right 08/09/2022 Right 08/12/2022 Right 08/17/2022 Right 08/26/2022 Right 08/29/22  Hip flexion       Hip extension       Hip abduction       Hip adduction       Hip internal rotation       Hip external rotation       Knee flexion A:90 P:95 107 108 AROM in supine heel slide 114 AROM supine 115 AROM  Knee extension A:4 -2  -2 4 AROM  Ankle dorsiflexion       Ankle plantarflexion       Ankle inversion       Ankle eversion        (Blank rows = not tested)  LOWER EXTREMITY MMT:  MMT in sitting Right 08/09/2022 Right 08/29/22  Hip flexion 4 4+  Hip extension    Hip abduction 4 5  Hip adduction    Hip internal rotation    Hip external rotation    Knee flexion 4 5  Knee extension 4 5  Ankle dorsiflexion    Ankle plantarflexion    Ankle inversion    Ankle eversion     (Blank rows = not tested)   FUNCTIONAL TESTS:  08/09/2022 Timed up and go (TUG): 10:25 without AD  GAIT: 08/28/22: Gait 500 feet independently in clinic  08/17/2022 :  SPC in Rt UE upon arrival.  Cues and education given for Lt UE use.   08/09/2022 Eval Comments: ambulates in with RW, but able to walk 25 feet X 2 without AD with loose supervision.                    TODAY'S TREATMENT:                                                                      DATE:  09/02/22 Pt seen for aquatic therapy today.  Treatment took place in water 3.5-4.75 ft  in depth at the [facility] pool. Temp of water was 91.  Pt  entered/exited the pool via stairs with hand rail.   Pt requires the buoyancy and hydrostatic pressure of water for support, and to offload joints by unweighting joint load by at least 50 % in navel deep water and by at least 75-80% in chest to neck deep water.  Viscosity of the water is needed for resistance of strengthening. Water current perturbations provides challenge to standing balance requiring increased core activation. Aquatic PT exercises Standing gastroc stretch 30 sec X 3 Standing marches X 20 bilat Standing hip swings abd/add X 20 bilat Standing hip swings flex/ext X 20 bilat Standing hip circles X 15 CW, X 15 CCW bilat Standing hamstring stretch 30 sec X 3 Standing hamstring curls with UE support X 20 bilat Sidestepping length of pool 3 round trips  Forward walking  3 round trips backward walking 3 round trips 1/4 squat and shoulder extension to lap with kickboard X 20 1/4 squat and push/pull kickboard X 20  Step ups onto first step of pool with UE support X 15 bilat Squats on 2nd step of pool X 15 Gastroc stretch heels off step 30 sec X 3 Seated on pool bench and cycling 3 min  08/31/2022 Recumbent bike Seat 7 for 10 minutes  Supine heel prop with quad sets 2 X10 Supine heelslides 5 sec X 10 Standing gastroc stretch 30 sec X 3 bilat  Functional Activities: Discussed and educated on gym equipment to use post discharge  Leg extension machine 5# up with both and down with one. 2X10 Leg curl machine 25# DL [id number] Double Leg Press 664# 15 x slow eccentrics full extension to full flexion Single Leg Press 43# 2 sets of 10 x slow eccentrics full extension to full flexion Updated and printed out HEP revision and aquatic PT information  08/29/2022 Recumbent bike Seat 7 for 8 minutes  Updated FOTO, measurements and goals Supine heel prop with quad sets X10 Discussed walking program with gentle progressions,  gym activity after discharge from PT. Discussed overall healing times   Functional Activities: Walked 500 feet in clinic [facility] gait pattern to meet long term goal for this Seated blaze pods to simulate reaction time with driving 30 sec X 3 used 2 pods with 2 different colors 30 sec X 1, then 3 pods with one color. Observed good overall reaction time for this needed for driving Double Leg Press 403# 15 x slow eccentrics full extension to full flexion Single Leg Press 43# 2 sets of 10 x slow eccentrics full extension to full flexion   08/26/2022 Recumbent bike Seat 7 for 5 minutes AAROM to AROM  T[place]gate knee flexion 1 minute Seated knee flexion 10 x 10 seconds Seated straight leg raises 4 sets of 5 bilateral with 2# weight on ankle Standing hip abduction with Thera-band resistance (red) 2 sets of 10 x bilateral  Functional Activities: Double Leg Press 100# 15 x slow eccentrics full extension to full flexion Single Leg Press 43# 2 sets of 10 x slow eccentrics full extension to full flexion Step-up and over 6 inch step 15 x each side slow eccentrics  Vaso right knee 10 minutes medium 34*   PATIENT EDUCATION: Education details: HEP, PT plan of care Person educated: Patient Education method: Explanation, Demonstration, Verbal cues, and Handouts Education comprehension: verbalized understanding and needs further education   HOME EXERCISE PROGRAM: Access Code: [email or web address] URL: [email or web address] Date: 08/31/2022 Prepared by: Ivery Quale  Exercises - Supine Heel Slide with Strap  - 2 x daily - 6 x  weekly - 1-2 sets - 10 reps - 5 hold - Heel Prop  - 2 x daily - 6 x weekly - 1 sets - 1 reps - 5-10 min hold - Seated Straight Leg Raise with Quad Contraction  - 2 x daily - 6 x weekly - 3 sets - 10 reps - Sit to Stand  - 2 x daily - 6 x weekly - 1-2 sets - 10 reps - Standing Gastroc Stretch at Counter  - 2 x daily - 6 x weekly - 1 sets - 3 reps - 30 hold - Full Leg Press  -  2-3 x weekly - 2-3 sets - 10-15 reps - Hamstring Curl with Weight Machine  - 2-3 x weekly - 2-3 sets - 10-15 reps - Knee Extension with Weight Machine  - 2-3 x weekly - 2-3 sets - 10-15 reps - Recumbent Bike  - 2-3 x weekly  Patient Education - [facility]  AQUATIC HEP Access Code: [email or web address] URL: [email or web address] Date: 09/02/2022 Prepared by: Ivery Quale  Exercises - Forward Walking  - 1 x daily - 2 x weekly - 4 sets - Side Stepping  - 2 x daily - 2 x weekly - 4 sets - 10 reps - Standing Hip Flexion Extension at [employer]  - 1 x daily - 2 x weekly - 15 reps - Standing Hip Abduction Adduction at [employer]  - 1 x daily - 2 x weekly - 20 reps - Forward March  - 1 x daily - 2 x weekly - 4 sets - Backward March  - 1 x daily - 2 x weekly - 4 sets - Squat  - 1 x daily - 2 x weekly - 15 reps - Lunge to Target at [employer]  - 1 x daily - 2 x weekly - 1 sets - 20 reps - Standing Single Leg Hip Circles  - 1 x daily - 2 x weekly - 1 sets - 20 reps  ASSESSMENT:  CLINICAL IMPRESSION:  Session today was for one aquatic PT visit to show her HEP for this. She had good overall tolerance to this in session today and showed good understanding of how to do the exercises at her community pool.   OBJECTIVE IMPAIRMENTS: decreased activity tolerance, difficulty walking, decreased balance, decreased endurance, decreased mobility, decreased ROM, decreased strength, impaired flexibility, impaired LE use, and pain.  ACTIVITY LIMITATIONS: bending, lifting, carry, locomotion, cleaning, community activity, driving, and or occupation  PERSONAL FACTORS: PMH includes Rt TKA,OA, GERD, HLD, HTN, spinal stenosis. are also affecting patient's functional outcome.  REHAB POTENTIAL: Good  CLINICAL DECISION MAKING: Stable/uncomplicated  EVALUATION COMPLEXITY: Low    GOALS: Short term PT Goals Target date: 09/06/2022   Pt will be I and compliant with HEP. Baseline:  Goal status: Met  08/12/2022   Long term PT goals Target date:11/01/2022  Pt will improve knee ROM to [facility] 0-110 to improve functional mobility  Goal status: Partially Met 08/26/2022  Pt will improve  hip/knee strength to at least 5-/5 MMT to improve functional strength  Goal status: partially met 08/29/22  Pt will improve FOTO to at least 55% functional to show improved function  Goal status: Met 08/29/22  Pt will reduce pain to overall less than 2-3/10 with usual activity and work activity.  Goal status: MET 08/29/22  Pt will be able to ambulate community distances at least 500 ft WNL gait pattern without complaints  Goal status: MET 08/29/22  6. Pt will improve TUG time  to less than 9 seconds to show improved gait speed and sit to stand ability  Goal status: ongoing, 08/29/22  PLAN: PT FREQUENCY: 1-3 times per week   PT DURATION: 6 weeks  PLANNED INTERVENTIONS (unless contraindicated): aquatic PT, Canalith repositioning, cryotherapy, Electrical stimulation, Iontophoresis with 4 mg/ml dexamethasome, Moist heat, traction, Ultrasound, gait training, Therapeutic exercise, balance training, neuromuscular re-education, patient/family education, prosthetic training, manual techniques, passive ROM, dry needling, taping, vasopnuematic device, vestibular, spinal manipulations, joint manipulations  PLAN FOR NEXT SESSION:    Will need humana reauth next visit how was water session, continue with knee extension ROM and quad strength   Ivery Quale, PT, DPT 09/02/22 8:52 AM

## 2022-09-04 LAB — URINE CULTURE

## 2022-09-05 ENCOUNTER — Encounter: Payer: Self-pay | Admitting: Family Medicine

## 2022-09-06 ENCOUNTER — Ambulatory Visit: Payer: Medicare PPO | Admitting: Rehabilitative and Restorative Service Providers"

## 2022-09-06 ENCOUNTER — Encounter: Payer: Self-pay | Admitting: Orthopaedic Surgery

## 2022-09-06 ENCOUNTER — Other Ambulatory Visit (INDEPENDENT_AMBULATORY_CARE_PROVIDER_SITE_OTHER): Payer: Medicare PPO

## 2022-09-06 ENCOUNTER — Ambulatory Visit (INDEPENDENT_AMBULATORY_CARE_PROVIDER_SITE_OTHER): Payer: Medicare PPO | Admitting: Orthopaedic Surgery

## 2022-09-06 ENCOUNTER — Encounter: Payer: Self-pay | Admitting: Rehabilitative and Restorative Service Providers"

## 2022-09-06 DIAGNOSIS — R600 Localized edema: Secondary | ICD-10-CM

## 2022-09-06 DIAGNOSIS — R262 Difficulty in walking, not elsewhere classified: Secondary | ICD-10-CM | POA: Diagnosis not present

## 2022-09-06 DIAGNOSIS — M6281 Muscle weakness (generalized): Secondary | ICD-10-CM | POA: Diagnosis not present

## 2022-09-06 DIAGNOSIS — M25651 Stiffness of right hip, not elsewhere classified: Secondary | ICD-10-CM

## 2022-09-06 DIAGNOSIS — M25561 Pain in right knee: Secondary | ICD-10-CM

## 2022-09-06 DIAGNOSIS — Z96651 Presence of right artificial knee joint: Secondary | ICD-10-CM

## 2022-09-06 NOTE — Therapy (Addendum)
OUTPATIENT PHYSICAL THERAPY TREATMENT /PROGRESS NOTE/ HUMANA RECERT    Patient Name: Paula Conway MRN: [id number] DOB:February 14, 1955, 67 y.o., female Today's Date: 09/06/2022  Progress Note Reporting Period 08/09/2022 to 09/06/2022  See note below for Objective Data and Assessment of Progress/Goals.   END OF SESSION:  PT End of Session - 09/06/22 0832     Visit Number 13    Number of Visits 24    Date for PT Re-Evaluation 10/16/22   to match long term goals   Authorization Type [employer]    Authorization - Visit Number 1    Authorization - Number of Visits 12    PT Start Time 0828    PT Stop Time 0909    PT Time Calculation (min) 41 min    Activity Tolerance Patient tolerated treatment well    Behavior During Therapy [facility] for tasks assessed/performed                Past Medical History:  Diagnosis Date   Allergy    Arthritis    Complication of anesthesia    waking up during colonoscopy and difficulty waking up   Environmental and seasonal allergies 07/20/2015   Family history of adverse reaction to anesthesia    pt's mother and daughter have difficulty waking up   Family history of breast cancer in Mother 07/20/2015   Mother was diagnosed at 8 years old and died age 3 of breast cancer    GERD (gastroesophageal reflux disease)    Headache    History of kidney stones    Hyperlipidemia    Hypertension    stressed induced   Obesity 07/20/2015   Pre-diabetes    Spinal stenosis    Past Surgical History:  Procedure Laterality Date   APPENDECTOMY     BREAST BIOPSY Right    COLONOSCOPY     core biopsy     TOTAL KNEE ARTHROPLASTY Right 07/25/2022   Procedure: RIGHT TOTAL KNEE ARTHROPLASTY;  Surgeon: Tarry Kos, MD;  Location: [facility] OR;  Service: Orthopedics;  Laterality: Right;   VAGINA SURGERY     after having a baby   Patient Active Problem List   Diagnosis Date Noted   Status post total right knee replacement 07/25/2022   Primary osteoarthritis of right  knee 07/24/2022   Dysuria 07/05/2022   Bilateral primary osteoarthritis of knee 08/12/2021   Chronic pain of right knee 08/05/2020   Statin declined 11/16/2018   Grief reaction- loss of husband [name] Aug 8th, 2020 10/02/2018   Dysfunction of both eustachian tubes 07/24/2017   BPPV (benign paroxysmal positional vertigo), unspecified laterality 07/24/2017   Vitamin D insufficiency 03/15/2017   Lumbar discogenic pain syndrome 02/15/2017   Radiculopathy, lumbar region 02/15/2017   Arthritis of facet joints at multiple vertebral levels 02/15/2017   Low back pain 02/15/2017   Obesity, Class I, BMI 30-34.9 08/30/2016   Prediabetes 07/30/2015   Elevated LDL cholesterol level 07/30/2015   Family history of breast cancer in Mother 07/20/2015   GERD (gastroesophageal reflux disease) 07/20/2015   Generalized OA 07/20/2015   Hyperlipidemia 07/20/2015   Environmental and seasonal allergies 07/20/2015   Spinal stenosis: Chronic back pain  07/20/2015   Neoplasm of connective tissue 11/23/2011    PCP: [name], PA   REFERRING PROVIDER: Tarry Kos, MD  REFERRING DIAG: M17.11 (ICD-10-CM) - Primary osteoarthritis of right knee Z96.651 (ICD-10-CM) - Status post total right knee replacement  THERAPY DIAG:  Acute pain of right knee  Stiffness  of right hip, not elsewhere classified  Muscle weakness (generalized)  Difficulty in walking, not elsewhere classified  Localized edema  Rationale for Evaluation and Treatment: Rehabilitation  ONSET DATE: Rt TKA 07/25/22   SUBJECTIVE:   SUBJECTIVE STATEMENT: Pt reported improving in most areas.  Reported some pain this morning at times in walking.  Reported overall improvement to normal at 75-80%.  GROC +6 at this time.    PERTINENT HISTORY: PMH includes Rt TKA,OA, GERD, HLD, HTN, spinal stenosis.  PAIN:  NPRS scale: pain at most last 3-4 days:  8/10 Pain location: Rt knee  Pain description: small throb and then discomfort/tightness   Aggravating factors: random in WB at times Relieving factors: rest, tylenol, exercises, ice   PRECAUTIONS: None  RED FLAGS: None   WEIGHT BEARING RESTRICTIONS: No  FALLS:  Has patient fallen in last 6 months? No  LIVING ENVIRONMENT: 15 steps with handrail on left  OCCUPATION: works part time sitting work parking   PLOF: Independent  PATIENT GOALS: be able to drive before 6 weeks.   NEXT MD VISIT: 09/06/22  OBJECTIVE:   PATIENT SURVEYS:  08/29/22 FOTO improved to 61% and met goal for this  08/09/2022 FOTO 43% functional intake, goal is 55%   COGNITION: 08/09/2022 Overall cognitive status: Within functional limits for tasks assessed    EDEMA:  08/09/2022 Moderate edema noted Rt knee at eval  LOWER EXTREMITY ROM:  Active ROM/PROM Right 08/09/2022 Right 08/12/2022 Right 08/17/2022 Right 08/26/2022 Right 08/29/22 Right 09/06/2022  Hip flexion        Hip extension        Hip abduction        Hip adduction        Hip internal rotation        Hip external rotation        Knee flexion A:90 P:95 107 108 AROM in supine heel slide 114 AROM supine 115 AROM 115 AROM in supine heel slide  Knee extension A:4 -2  -2 4 AROM -5 AROM in Seated LAQ  -2 in quad set supine   Ankle dorsiflexion        Ankle plantarflexion        Ankle inversion        Ankle eversion         (Blank rows = not tested)  LOWER EXTREMITY MMT:  MMT in sitting Right 08/09/2022 Right 08/29/22 Right 09/06/2022 Left 09/06/2022  Hip flexion 4 4+    Hip extension      Hip abduction 4 5    Hip adduction      Hip internal rotation      Hip external rotation      Knee flexion 4 5    Knee extension 4 5 5/5 35.3, 34 lbs 5/5 33.9, 35.8 lbs  Ankle dorsiflexion      Ankle plantarflexion      Ankle inversion      Ankle eversion       (Blank rows = not tested)   FUNCTIONAL TESTS:   08/09/2022 Timed up and go (TUG): 10:25 without AD  GAIT: 08/28/22: Gait 500 feet independently in clinic  08/17/2022 :   SPC in Rt UE upon arrival.  Cues and education given for Lt UE use.   08/09/2022 Eval Comments: ambulates in with RW, but able to walk 25 feet X 2 without AD with loose supervision.  TODAY'S TREATMENT:                                                                      DATE:  09/06/2022 Therex: Recumbent bike Seat 7 for 8 minutes  lvl 2  Single Leg Press 43 lbs 2 x 15 single leg, performed bilaterally  Leg extension machine 10 lbs 2 x 10 double leg up single leg down, performed bilaterally  Lateral step down, performed bilaterally on bottom stair in clinic x 15 with slow lowering focus (education for home use) Forward step up/down on bottom stair in clinic x 15 , performed bilaterally (education for home use)   Additional time spent in review of HEP options that were not machine based as Pt did not have access to things like leg press, knee extension machine etc.    TODAY'S TREATMENT:                                                                      DATE: 09/02/22 Pt seen for aquatic therapy today.  Treatment took place in water 3.5-4.75 ft in depth at the [facility] pool. Temp of water was 91.  Pt entered/exited the pool via stairs with hand rail.   Pt requires the buoyancy and hydrostatic pressure of water for support, and to offload joints by unweighting joint load by at least 50 % in navel deep water and by at least 75-80% in chest to neck deep water.  Viscosity of the water is needed for resistance of strengthening. Water current perturbations provides challenge to standing balance requiring increased core activation. Aquatic PT exercises Standing gastroc stretch 30 sec X 3 Standing marches X 20 bilat Standing hip swings abd/add X 20 bilat Standing hip swings flex/ext X 20 bilat Standing hip circles X 15 CW, X 15 CCW bilat Standing hamstring stretch 30 sec X 3 Standing hamstring curls with UE support X 20 bilat Sidestepping length of pool 3 round  trips  Forward walking  3 round trips backward walking 3 round trips 1/4 squat and shoulder extension to lap with kickboard X 20 1/4 squat and push/pull kickboard X 20  Step ups onto first step of pool with UE support X 15 bilat Squats on 2nd step of pool X 15 Gastroc stretch heels off step 30 sec X 3 Seated on pool bench and cycling 3 min  TODAY'S TREATMENT:                                                                      DATE: 08/31/2022 Recumbent bike Seat 7 for 10 minutes  Supine heel prop with quad sets 2 X10 Supine heelslides 5 sec X 10 Standing gastroc stretch 30 sec X 3 bilat  Functional Activities: Discussed and  educated on gym equipment to use post discharge  Leg extension machine 5# up with both and down with one. 2X10 Leg curl machine 25# DL 8U13 Double Leg Press 244# 15 x slow eccentrics full extension to full flexion Single Leg Press 43# 2 sets of 10 x slow eccentrics full extension to full flexion Updated and printed out HEP revision and aquatic PT information  TODAY'S TREATMENT:                                                                      DATE: 08/29/2022 Recumbent bike Seat 7 for 8 minutes  Updated FOTO, measurements and goals Supine heel prop with quad sets X10 Discussed walking program with gentle progressions, gym activity after discharge from PT. Discussed overall healing times   Functional Activities: Walked 500 feet in clinic [facility] gait pattern to meet long term goal for this Seated blaze pods to simulate reaction time with driving 30 sec X 3 used 2 pods with 2 different colors 30 sec X 1, then 3 pods with one color. Observed good overall reaction time for this needed for driving Double Leg Press 010# 15 x slow eccentrics full extension to full flexion Single Leg Press 43# 2 sets of 10 x slow eccentrics full extension to full flexion   PATIENT EDUCATION: Education details: HEP, PT plan of care Person educated: Patient Education method:  Explanation, Demonstration, Verbal cues, and Handouts Education comprehension: verbalized understanding and needs further education   HOME EXERCISE PROGRAM: Access Code: [email or web address] URL: [email or web address] Date: 08/31/2022 Prepared by: Ivery Quale  Exercises - Supine Heel Slide with Strap  - 2 x daily - 6 x weekly - 1-2 sets - 10 reps - 5 hold - Heel Prop  - 2 x daily - 6 x weekly - 1 sets - 1 reps - 5-10 min hold - Seated Straight Leg Raise with Quad Contraction  - 2 x daily - 6 x weekly - 3 sets - 10 reps - Sit to Stand  - 2 x daily - 6 x weekly - 1-2 sets - 10 reps - Standing Gastroc Stretch at Counter  - 2 x daily - 6 x weekly - 1 sets - 3 reps - 30 hold - Full Leg Press  - 2-3 x weekly - 2-3 sets - 10-15 reps - Hamstring Curl with Weight Machine  - 2-3 x weekly - 2-3 sets - 10-15 reps - Knee Extension with Weight Machine  - 2-3 x weekly - 2-3 sets - 10-15 reps - Recumbent Bike  - 2-3 x weekly  Patient Education - [facility]  AQUATIC HEP Access Code: [email or web address] URL: [email or web address] Date: 09/02/2022 Prepared by: Ivery Quale  Exercises - Forward Walking  - 1 x daily - 2 x weekly - 4 sets - Side Stepping  - 2 x daily - 2 x weekly - 4 sets - 10 reps - Standing Hip Flexion Extension at [employer]  - 1 x daily - 2 x weekly - 15 reps - Standing Hip Abduction Adduction at Pool Wall  - 1 x daily - 2 x weekly - 20 reps - Forward March  - 1 x daily - 2 x weekly - 4 sets - Backward March  - 1 x daily -  2 x weekly - 4 sets - Squat  - 1 x daily - 2 x weekly - 15 reps - Lunge to [employer] at [employer]  - 1 x daily - 2 x weekly - 1 sets - 20 reps - Standing Single Leg Hip Circles  - 1 x daily - 2 x weekly - 1 sets - 20 reps  ASSESSMENT:  CLINICAL IMPRESSION:  The patient has attended 12 visits over the course of treatment cycle.  Patient has reported overall improvement at 75-80%  with global rating of change at a great deal better +6.  See  objective data above for updated information regarding current presentation.  Pt has made good gains towards established goals.  At this time, continued strength improvements to help improve stair navigation and other WB activity.  Plan for a few more visits with focus on strengthening and HEP transitioning confidence upon time for discharge.    Spent time in education of stair based strengthening for HEP as she doesn't have gym equipment.    OBJECTIVE IMPAIRMENTS: decreased activity tolerance, difficulty walking, decreased balance, decreased endurance, decreased mobility, decreased ROM, decreased strength, impaired flexibility, impaired LE use, and pain.  ACTIVITY LIMITATIONS: bending, lifting, carry, locomotion, cleaning, community activity, driving, and or occupation  PERSONAL FACTORS: PMH includes Rt TKA,OA, GERD, HLD, HTN, spinal stenosis. are also affecting patient's functional outcome.  REHAB POTENTIAL: Good  CLINICAL DECISION MAKING: Stable/uncomplicated  EVALUATION COMPLEXITY: Low    GOALS: Short term PT Goals Target date: 09/06/2022   Pt will be I and compliant with HEP. Baseline:  Goal status: Met 08/12/2022   Long term PT goals Target date:10/16/2022  Pt will improve knee ROM to [facility] 0-110 to improve functional mobility  Goal status: Partially Met 08/26/2022  Pt will improve bilateral knee extension dynamometry to 45 lbs or > for WB functional strength.  Goal status: revised 09/06/2022  Pt will improve FOTO to at least 55% functional to show improved function  Goal status: Met 08/29/22  Pt will reduce pain to overall less than 2-3/10 with usual activity and work activity.  Goal status:  08/29/22  Pt will be able to ambulate community distances at least 500 ft WNL gait pattern without complaints  Goal status: MET 08/29/22  6. Pt will improve TUG time to less than 9 seconds to show improved gait speed and sit to stand ability  Goal status: ongoing,  08/29/22  PLAN: PT FREQUENCY: 1-2 times per week   PT DURATION: 6 weeks  PLANNED INTERVENTIONS (unless contraindicated): aquatic PT, Canalith repositioning, cryotherapy, Electrical stimulation, Iontophoresis with 4 mg/ml dexamethasome, Moist heat, traction, Ultrasound, gait training, Therapeutic exercise, balance training, neuromuscular re-education, patient/family education, prosthetic training, manual techniques, passive ROM, dry needling, taping, vasopnuematic device, vestibular, spinal manipulations, joint manipulations  PLAN FOR NEXT SESSION:    Progressive strengthening on machines and in WB on things not available at home   Chyrel Masson, PT, DPT, OCS, ATC 09/06/22  9:15 AM     Referring diagnosis? M17.11 (ICD-10-CM) - Primary osteoarthritis of right knee Treatment diagnosis? (if different than referring diagnosis) m25.561 What was this (referring dx) caused by? [x]  Surgery []  Fall []  Ongoing issue [x]  Arthritis []  Other: ____________   Laterality: [x]  Rt []  Lt []  Both   Check all possible CPT codes:             *CHOOSE 10 OR LESS*                          [  x] [id number] (Therapeutic Exercise)             []  92507 (SLP Treatment)  [x]  97112 (Neuro Re-ed)                           []  92526 (Swallowing Treatment)             [x]  97116 (Gait Training)                           []  [id number] (Cognitive Training, 1st 15 minutes) [x]  97140 (Manual Therapy)                                []  97130 (Cognitive Training, each add'l 15 minutes)   []  97164 (Re-evaluation)                              []  Other, List CPT Code ____________  [x]  97530 (Therapeutic Activities)                                    [x]  97535 (Self Care)                      []  All codes above (97110 - 97535)            []  16109 (Mechanical Traction)            [x]  97014 (E-stim Unattended)            []  97032 (E-stim manual)            []  97033 (Ionto)            []  97035 (Ultrasound) [x]  97750 (Physical  Performance Training) [x]  [id number] (Aquatic Therapy) []  97016 (Vasopneumatic Device) []  [id number] (Paraffin) []  97034 (Contrast Bath) []  97597 (Wound Care 1st 20 sq cm) []  97598 (Wound Care each add'l 20 sq cm) []  97760 (Orthotic Fabrication, Fitting, Training Initial) []  [id number] (Prosthetic Management and Training Initial) []  [id number] (Orthotic or Prosthetic Training/ Modification Subsequent)

## 2022-09-06 NOTE — Progress Notes (Signed)
Post-Op Visit Note   Patient: Paula Conway           Date of Birth: 1955/12/01           MRN: [id number] Visit Date: 09/06/2022 PCP: [name], PA   Assessment & Plan:  Chief Complaint:  Chief Complaint  Patient presents with   Right Knee - Follow-up    Right total knee arthroplasty 07/25/2022   Visit Diagnoses:  1. Status post total right knee replacement     Plan: Zella Ball is 6 weeks status post right total knee replacement.  She reports swelling related to swelling.  She is doing physical therapy once a week.  Excellent progress in physical therapy.  Takes Tylenol as needed.  Examination of the right knee shows fully healed surgical scar.  Range of motion is progressing nicely.  She can get 115 degrees of flexion.  I am happy with Robin's recovery and progress so far.  She would like to continue to do physical therapy once a week for 4 weeks or so.  Back to see Mardella Layman in about 6 weeks for 20-month recheck.  Follow-Up Instructions: Return in about 6 weeks (around 10/18/2022) for with lindsey.   Orders:  Orders Placed This Encounter  Procedures   XR Knee 1-2 Views Right   No orders of the defined types were placed in this encounter.   Imaging: XR Knee 1-2 Views Right  Result Date: 09/06/2022 Stable right total knee replacement in good alignment    PMFS History: Patient Active Problem List   Diagnosis Date Noted   Status post total right knee replacement 07/25/2022   Primary osteoarthritis of right knee 07/24/2022   Dysuria 07/05/2022   Bilateral primary osteoarthritis of knee 08/12/2021   Chronic pain of right knee 08/05/2020   Statin declined 11/16/2018   Grief reaction- loss of husband [name] Aug 8th, 2020 10/02/2018   Dysfunction of both eustachian tubes 07/24/2017   BPPV (benign paroxysmal positional vertigo), unspecified laterality 07/24/2017   Vitamin D insufficiency 03/15/2017   Lumbar discogenic pain syndrome 02/15/2017   Radiculopathy, lumbar  region 02/15/2017   Arthritis of facet joints at multiple vertebral levels 02/15/2017   Low back pain 02/15/2017   Obesity, Class I, BMI 30-34.9 08/30/2016   Prediabetes 07/30/2015   Elevated LDL cholesterol level 07/30/2015   Family history of breast cancer in Mother 07/20/2015   GERD (gastroesophageal reflux disease) 07/20/2015   Generalized OA 07/20/2015   Hyperlipidemia 07/20/2015   Environmental and seasonal allergies 07/20/2015   Spinal stenosis: Chronic back pain  07/20/2015   Neoplasm of connective tissue 11/23/2011   Past Medical History:  Diagnosis Date   Allergy    Arthritis    Complication of anesthesia    waking up during colonoscopy and difficulty waking up   Environmental and seasonal allergies 07/20/2015   Family history of adverse reaction to anesthesia    pt's mother and daughter have difficulty waking up   Family history of breast cancer in Mother 07/20/2015   Mother was diagnosed at 32 years old and died age 90 of breast cancer    GERD (gastroesophageal reflux disease)    Headache    History of kidney stones    Hyperlipidemia    Hypertension    stressed induced   Obesity 07/20/2015   Pre-diabetes    Spinal stenosis     Family History  Problem Relation Age of Onset   Breast cancer Mother    Diabetes Father  Hypertension Father    Heart disease Father     Past Surgical History:  Procedure Laterality Date   APPENDECTOMY     BREAST BIOPSY Right    COLONOSCOPY     core biopsy     TOTAL KNEE ARTHROPLASTY Right 07/25/2022   Procedure: RIGHT TOTAL KNEE ARTHROPLASTY;  Surgeon: Tarry Kos, MD;  Location: [facility] OR;  Service: Orthopedics;  Laterality: Right;   VAGINA SURGERY     after having a baby   Social History   Occupational History   Not on file  Tobacco Use   Smoking status: Former    Current packs/day: 0.00    Average packs/day: 0.3 packs/day for 3.0 years (0.8 ttl pk-yrs)    Types: Cigarettes    Start date: 01/11/1972    Quit date:  01/11/1975    Years since quitting: 47.6    Passive exposure: Past   Smokeless tobacco: Never  Vaping Use   Vaping status: Never Used  Substance and Sexual Activity   Alcohol use: Yes    Comment: socially   Drug use: No   Sexual activity: Yes

## 2022-09-06 NOTE — Addendum Note (Signed)
Addended by: Chyrel Masson B on: 09/06/2022 09:18 AM   Modules accepted: Orders

## 2022-09-15 ENCOUNTER — Encounter: Payer: Self-pay | Admitting: Rehabilitative and Restorative Service Providers"

## 2022-09-15 ENCOUNTER — Ambulatory Visit: Payer: Medicare PPO | Admitting: Rehabilitative and Restorative Service Providers"

## 2022-09-15 DIAGNOSIS — M25651 Stiffness of right hip, not elsewhere classified: Secondary | ICD-10-CM | POA: Diagnosis not present

## 2022-09-15 DIAGNOSIS — M25561 Pain in right knee: Secondary | ICD-10-CM

## 2022-09-15 DIAGNOSIS — M6281 Muscle weakness (generalized): Secondary | ICD-10-CM

## 2022-09-15 DIAGNOSIS — R600 Localized edema: Secondary | ICD-10-CM

## 2022-09-15 DIAGNOSIS — R262 Difficulty in walking, not elsewhere classified: Secondary | ICD-10-CM | POA: Diagnosis not present

## 2022-09-15 NOTE — Therapy (Signed)
OUTPATIENT PHYSICAL THERAPY TREATMENT NOTE    Patient Name: Paula Conway MRN: [id number] DOB:1955/07/16, 67 y.o., female Today's Date: 09/15/2022  END OF SESSION:  PT End of Session - 09/15/22 1020     Visit Number 14    Number of Visits 24    Date for PT Re-Evaluation 10/16/22   to match long term goals   Authorization Type [employer]    Authorization - Visit Number 2    Authorization - Number of Visits 12    Progress Note Due on Visit 12    PT Start Time 1017    PT Stop Time 1059    PT Time Calculation (min) 42 min    Activity Tolerance Patient tolerated treatment well;No increased pain    Behavior During Therapy [facility] for tasks assessed/performed                 Past Medical History:  Diagnosis Date   Allergy    Arthritis    Complication of anesthesia    waking up during colonoscopy and difficulty waking up   Environmental and seasonal allergies 07/20/2015   Family history of adverse reaction to anesthesia    pt's mother and daughter have difficulty waking up   Family history of breast cancer in Mother 07/20/2015   Mother was diagnosed at 54 years old and died age 20 of breast cancer    GERD (gastroesophageal reflux disease)    Headache    History of kidney stones    Hyperlipidemia    Hypertension    stressed induced   Obesity 07/20/2015   Pre-diabetes    Spinal stenosis    Past Surgical History:  Procedure Laterality Date   APPENDECTOMY     BREAST BIOPSY Right    COLONOSCOPY     core biopsy     TOTAL KNEE ARTHROPLASTY Right 07/25/2022   Procedure: RIGHT TOTAL KNEE ARTHROPLASTY;  Surgeon: Tarry Kos, MD;  Location: [facility] OR;  Service: Orthopedics;  Laterality: Right;   VAGINA SURGERY     after having a baby   Patient Active Problem List   Diagnosis Date Noted   Status post total right knee replacement 07/25/2022   Primary osteoarthritis of right knee 07/24/2022   Dysuria 07/05/2022   Bilateral primary osteoarthritis of knee 08/12/2021   Chronic  pain of right knee 08/05/2020   Statin declined 11/16/2018   Grief reaction- loss of husband [name] Aug 8th, 2020 10/02/2018   Dysfunction of both eustachian tubes 07/24/2017   BPPV (benign paroxysmal positional vertigo), unspecified laterality 07/24/2017   Vitamin D insufficiency 03/15/2017   Lumbar discogenic pain syndrome 02/15/2017   Radiculopathy, lumbar region 02/15/2017   Arthritis of facet joints at multiple vertebral levels 02/15/2017   Low back pain 02/15/2017   Obesity, Class I, BMI 30-34.9 08/30/2016   Prediabetes 07/30/2015   Elevated LDL cholesterol level 07/30/2015   Family history of breast cancer in Mother 07/20/2015   GERD (gastroesophageal reflux disease) 07/20/2015   Generalized OA 07/20/2015   Hyperlipidemia 07/20/2015   Environmental and seasonal allergies 07/20/2015   Spinal stenosis: Chronic back pain  07/20/2015   Neoplasm of connective tissue 11/23/2011    PCP: [name], PA   REFERRING PROVIDER: Tarry Kos, MD  REFERRING DIAG: M17.11 (ICD-10-CM) - Primary osteoarthritis of right knee Z96.651 (ICD-10-CM) - Status post total right knee replacement  THERAPY DIAG:  Acute pain of right knee  Stiffness of right hip, not elsewhere classified  Difficulty in walking, not  elsewhere classified  Localized edema  Muscle weakness (generalized)  Rationale for Evaluation and Treatment: Rehabilitation  ONSET DATE: Rt TKA 07/25/22   SUBJECTIVE:   SUBJECTIVE STATEMENT: Paula Conway is getting no more than about 3 hours of uninterrupted sleep.  She has returned to work and is sore from the change in activity.   PERTINENT HISTORY: PMH includes Rt TKA,OA, GERD, HLD, HTN, spinal stenosis.  PAIN:  NPRS scale: 3-6/10 over the past week Pain location: Rt knee  Pain description: small throb and then discomfort/tightness  Aggravating factors: random in WB at times Relieving factors: rest, tylenol, exercises, ice   PRECAUTIONS: None  RED  FLAGS: None   WEIGHT BEARING RESTRICTIONS: No  FALLS:  Has patient fallen in last 6 months? No  LIVING ENVIRONMENT: 15 steps with handrail on left  OCCUPATION: works part time sitting work parking   PLOF: Independent  PATIENT GOALS: be able to drive before 6 weeks.   NEXT MD VISIT: 09/06/22  OBJECTIVE:   PATIENT SURVEYS:  08/29/22 FOTO improved to 61% and met goal for this  08/09/2022 FOTO 43% functional intake, goal is 55%   COGNITION: 08/09/2022 Overall cognitive status: Within functional limits for tasks assessed    EDEMA:  08/09/2022 Moderate edema noted Rt knee at eval  LOWER EXTREMITY ROM:  Active ROM/PROM Right 08/09/2022 Right 08/12/2022 Right 08/17/2022 Right 08/26/2022 Right 08/29/22 Right 09/06/2022  Hip flexion        Hip extension        Hip abduction        Hip adduction        Hip internal rotation        Hip external rotation        Knee flexion A:90 P:95 107 108 AROM in supine heel slide 114 AROM supine 115 AROM 115 AROM in supine heel slide  Knee extension A:4 -2  -2 4 AROM -5 AROM in Seated LAQ  -2 in quad set supine   Ankle dorsiflexion        Ankle plantarflexion        Ankle inversion        Ankle eversion         (Blank rows = not tested)  LOWER EXTREMITY MMT:  MMT in sitting Right 08/09/2022 Right 08/29/22 Right 09/06/2022 Left 09/06/2022  Hip flexion 4 4+    Hip extension      Hip abduction 4 5    Hip adduction      Hip internal rotation      Hip external rotation      Knee flexion 4 5    Knee extension 4 5 5/5 35.3, 34 lbs 5/5 33.9, 35.8 lbs  Ankle dorsiflexion      Ankle plantarflexion      Ankle inversion      Ankle eversion       (Blank rows = not tested)   FUNCTIONAL TESTS:   08/09/2022 Timed up and go (TUG): 10:25 without AD  GAIT: 08/28/22: Gait 500 feet independently in clinic  08/17/2022 :  SPC in Rt UE upon arrival.  Cues and education given for Lt UE use.   08/09/2022 Eval Comments: ambulates in with RW,  but able to walk 25 feet X 2 without AD with loose supervision.                    TODAY'S TREATMENT:  DATE:   09/15/2022 Recumbent bike Seat 7 for 6 minutes  Seated straight leg raises 3 sets of 5 for 3 seconds  Functional Activities: Double Leg Press 125# 15 x slow eccentrics full extension to full flexion Single Leg Press 62# 2 sets of 10 x slow eccentrics full extension to full flexion Step down off 4 inch step, slow eccentrics 2 sets of 10 with slow eccentrics Step-up and over 4 inch step slow eccentrics 10 x each   Neuromuscular re-education: Single-leg stance 5 x 10 seconds each   09/06/2022 Therex: Recumbent bike Seat 7 for 8 minutes  lvl 2  Single Leg Press 43 lbs 2 x 15 single leg, performed bilaterally  Leg extension machine 10 lbs 2 x 10 double leg up single leg down, performed bilaterally  Lateral step down, performed bilaterally on bottom stair in clinic x 15 with slow lowering focus (education for home use) Forward step up/down on bottom stair in clinic x 15 , performed bilaterally (education for home use)   Additional time spent in review of HEP options that were not machine based as Pt did not have access to things like leg press, knee extension machine etc.    TODAY'S TREATMENT:                                                                      DATE: 09/02/22 Pt seen for aquatic therapy today.  Treatment took place in water 3.5-4.75 ft in depth at the [facility] pool. Temp of water was 91.  Pt entered/exited the pool via stairs with hand rail.   Pt requires the buoyancy and hydrostatic pressure of water for support, and to offload joints by unweighting joint load by at least 50 % in navel deep water and by at least 75-80% in chest to neck deep water.  Viscosity of the water is needed for resistance of strengthening. Water current perturbations provides challenge to standing balance  requiring increased core activation. Aquatic PT exercises Standing gastroc stretch 30 sec X 3 Standing marches X 20 bilat Standing hip swings abd/add X 20 bilat Standing hip swings flex/ext X 20 bilat Standing hip circles X 15 CW, X 15 CCW bilat Standing hamstring stretch 30 sec X 3 Standing hamstring curls with UE support X 20 bilat Sidestepping length of pool 3 round trips  Forward walking  3 round trips backward walking 3 round trips 1/4 squat and shoulder extension to lap with kickboard X 20 1/4 squat and push/pull kickboard X 20  Step ups onto first step of pool with UE support X 15 bilat Squats on 2nd step of pool X 15 Gastroc stretch heels off step 30 sec X 3 Seated on pool bench and cycling 3 min   PATIENT EDUCATION: Education details: HEP, PT plan of care Person educated: Patient Education method: Explanation, Demonstration, Verbal cues, and Handouts Education comprehension: verbalized understanding and needs further education   HOME EXERCISE PROGRAM: Access Code: [email or web address] URL: [email or web address] Date: 08/31/2022 Prepared by: Ivery Quale  Exercises - Supine Heel Slide with Strap  - 2 x daily - 6 x weekly - 1-2 sets - 10 reps - 5 hold - Heel Prop  - 2 x daily - 6 x weekly - 1 sets -  1 reps - 5-10 min hold - Seated Straight Leg Raise with Quad Contraction  - 2 x daily - 6 x weekly - 3 sets - 10 reps - Sit to Stand  - 2 x daily - 6 x weekly - 1-2 sets - 10 reps - Standing Gastroc Stretch at Counter  - 2 x daily - 6 x weekly - 1 sets - 3 reps - 30 hold - Full Leg Press  - 2-3 x weekly - 2-3 sets - 10-15 reps - Hamstring Curl with Weight Machine  - 2-3 x weekly - 2-3 sets - 10-15 reps - Knee Extension with Weight Machine  - 2-3 x weekly - 2-3 sets - 10-15 reps - Recumbent Bike  - 2-3 x weekly  Patient Education - [facility]  AQUATIC HEP Access Code: [email or web address] URL: [email or web address] Date: 09/02/2022 Prepared by: Ivery Quale  Exercises - Forward Walking  - 1 x daily - 2 x weekly - 4 sets - Side Stepping  - 2 x daily - 2 x weekly - 4 sets - 10 reps - Standing Hip Flexion Extension at [employer]  - 1 x daily - 2 x weekly - 15 reps - Standing Hip Abduction Adduction at [employer]  - 1 x daily - 2 x weekly - 20 reps - Forward March  - 1 x daily - 2 x weekly - 4 sets - Backward March  - 1 x daily - 2 x weekly - 4 sets - Squat  - 1 x daily - 2 x weekly - 15 reps - Lunge to Target at [employer]  - 1 x daily - 2 x weekly - 1 sets - 20 reps - Standing Single Leg Hip Circles  - 1 x daily - 2 x weekly - 1 sets - 20 reps  ASSESSMENT:  CLINICAL IMPRESSION:  Paula Conway continues to have excellent active range of motion and home exercise program compliance.  As would be expected at this point post-surgery, her quadriceps are still weak and with her return to work and more physical demand, her pain has been higher over the last several days.  I reinforced the importance of non weight-bearing quadriceps strengthening like seated straight leg raises to complement her more functional weight-bearing activities and avoid overuse.  Paula Conway's prognosis remains excellent to meet the below listed goals.  OBJECTIVE IMPAIRMENTS: decreased activity tolerance, difficulty walking, decreased balance, decreased endurance, decreased mobility, decreased ROM, decreased strength, impaired flexibility, impaired LE use, and pain.  ACTIVITY LIMITATIONS: bending, lifting, carry, locomotion, cleaning, community activity, driving, and or occupation  PERSONAL FACTORS: PMH includes Rt TKA,OA, GERD, HLD, HTN, spinal stenosis. are also affecting patient's functional outcome.  REHAB POTENTIAL: Good  CLINICAL DECISION MAKING: Stable/uncomplicated  EVALUATION COMPLEXITY: Low    GOALS: Short term PT Goals Target date: 09/06/2022   Pt will be I and compliant with HEP. Baseline:  Goal status: Met 08/12/2022   Long term PT goals Target  date:10/16/2022  Pt will improve knee ROM to [facility] 0-110 to improve functional mobility  Goal status: Partially Met 08/26/2022  Pt will improve bilateral knee extension dynamometry to 45 lbs or > for WB functional strength.  Goal status: revised 09/06/2022  Pt will improve FOTO to at least 55% functional to show improved function  Goal status: Met 08/29/22  Pt will reduce pain to overall less than 2-3/10 with usual activity and work activity.  Goal status:  On Going 09/15/2022  Pt will be able to ambulate community distances  at least 500 ft WNL gait pattern without complaints  Goal status: MET 08/29/22  6. Pt will improve TUG time to less than 9 seconds to show improved gait speed and sit to stand ability  Goal status: ongoing, 08/29/22  PLAN: PT FREQUENCY: 1-2 times per week   PT DURATION: 6 weeks  PLANNED INTERVENTIONS (unless contraindicated): aquatic PT, Canalith repositioning, cryotherapy, Electrical stimulation, Iontophoresis with 4 mg/ml dexamethasome, Moist heat, traction, Ultrasound, gait training, Therapeutic exercise, balance training, neuromuscular re-education, patient/family education, prosthetic training, manual techniques, passive ROM, dry needling, taping, vasopnuematic device, vestibular, spinal manipulations, joint manipulations  PLAN FOR NEXT SESSION:    Quadriceps strengthening while avoiding overuse with a functional emphasis.  Cherlyn Cushing PT, MPT 09/15/22  5:37 PM     Referring diagnosis? M17.11 (ICD-10-CM) - Primary osteoarthritis of right knee Treatment diagnosis? (if different than referring diagnosis) m25.561 What was this (referring dx) caused by? [x]  Surgery []  Fall []  Ongoing issue [x]  Arthritis []  Other: ____________   Laterality: [x]  Rt []  Lt []  Both   Check all possible CPT codes:             *CHOOSE 10 OR LESS*                          [x]  97110 (Therapeutic Exercise)             []  92507 (SLP Treatment)  [x]  97112 (Neuro Re-ed)                            []  92526 (Swallowing Treatment)             [x]  97116 (Gait Training)                           []  [id number] (Cognitive Training, 1st 15 minutes) [x]  97140 (Manual Therapy)                                []  97130 (Cognitive Training, each add'l 15 minutes)   []  97164 (Re-evaluation)                              []  Other, List CPT Code ____________  [x]  97530 (Therapeutic Activities)                                    [x]  97535 (Self Care)                      []  All codes above (97110 - 97535)            []  97012 (Mechanical Traction)            [x]  97014 (E-stim Unattended)            []  97032 (E-stim manual)            []  97033 (Ionto)            []  97035 (Ultrasound) [x]  97750 (Physical Performance Training) [x]  [id number] (Aquatic Therapy) []  97016 (Vasopneumatic Device) []  [id number] (Paraffin) []  97034 (Contrast Bath) []  97597 (Wound Care 1st 20 sq cm) []  97598 (Wound Care each add'l 20 sq cm) []  97760 (Orthotic  Fabrication, Fitting, Training Initial) []  [id number] (Prosthetic Management and Training Initial) []  [id number] (Orthotic or Prosthetic Training/ Modification Subsequent)

## 2022-09-19 ENCOUNTER — Ambulatory Visit: Payer: Medicare PPO | Admitting: Physical Therapy

## 2022-09-19 ENCOUNTER — Encounter: Payer: Self-pay | Admitting: Physical Therapy

## 2022-09-19 DIAGNOSIS — M6281 Muscle weakness (generalized): Secondary | ICD-10-CM | POA: Diagnosis not present

## 2022-09-19 DIAGNOSIS — M25651 Stiffness of right hip, not elsewhere classified: Secondary | ICD-10-CM | POA: Diagnosis not present

## 2022-09-19 DIAGNOSIS — R600 Localized edema: Secondary | ICD-10-CM

## 2022-09-19 DIAGNOSIS — R262 Difficulty in walking, not elsewhere classified: Secondary | ICD-10-CM | POA: Diagnosis not present

## 2022-09-19 DIAGNOSIS — M25561 Pain in right knee: Secondary | ICD-10-CM

## 2022-09-19 NOTE — Therapy (Addendum)
OUTPATIENT PHYSICAL THERAPY TREATMENT NOTE    Patient Name: Paula Conway MRN: [id number] DOB:September 12, 1955, 67 y.o., female Today's Date: 09/19/2022  END OF SESSION:  PT End of Session - 09/19/22 1100     Visit Number 15    Number of Visits 24    Date for PT Re-Evaluation 10/16/22   to match long term goals   Authorization Type [employer]    Authorization - Visit Number 3    Authorization - Number of Visits 12    Progress Note Due on Visit 12    PT Start Time 1015    PT Stop Time 1056    PT Time Calculation (min) 41 min    Activity Tolerance Patient tolerated treatment well;No increased pain    Behavior During Therapy [facility] for tasks assessed/performed                  Past Medical History:  Diagnosis Date   Allergy    Arthritis    Complication of anesthesia    waking up during colonoscopy and difficulty waking up   Environmental and seasonal allergies 07/20/2015   Family history of adverse reaction to anesthesia    pt's mother and daughter have difficulty waking up   Family history of breast cancer in Mother 07/20/2015   Mother was diagnosed at 55 years old and died age 49 of breast cancer    GERD (gastroesophageal reflux disease)    Headache    History of kidney stones    Hyperlipidemia    Hypertension    stressed induced   Obesity 07/20/2015   Pre-diabetes    Spinal stenosis    Past Surgical History:  Procedure Laterality Date   APPENDECTOMY     BREAST BIOPSY Right    COLONOSCOPY     core biopsy     TOTAL KNEE ARTHROPLASTY Right 07/25/2022   Procedure: RIGHT TOTAL KNEE ARTHROPLASTY;  Surgeon: Tarry Kos, MD;  Location: [facility] OR;  Service: Orthopedics;  Laterality: Right;   VAGINA SURGERY     after having a baby   Patient Active Problem List   Diagnosis Date Noted   Status post total right knee replacement 07/25/2022   Primary osteoarthritis of right knee 07/24/2022   Dysuria 07/05/2022   Bilateral primary osteoarthritis of knee 08/12/2021   Chronic  pain of right knee 08/05/2020   Statin declined 11/16/2018   Grief reaction- loss of husband [name] Aug 8th, 2020 10/02/2018   Dysfunction of both eustachian tubes 07/24/2017   BPPV (benign paroxysmal positional vertigo), unspecified laterality 07/24/2017   Vitamin D insufficiency 03/15/2017   Lumbar discogenic pain syndrome 02/15/2017   Radiculopathy, lumbar region 02/15/2017   Arthritis of facet joints at multiple vertebral levels 02/15/2017   Low back pain 02/15/2017   Obesity, Class I, BMI 30-34.9 08/30/2016   Prediabetes 07/30/2015   Elevated LDL cholesterol level 07/30/2015   Family history of breast cancer in Mother 07/20/2015   GERD (gastroesophageal reflux disease) 07/20/2015   Generalized OA 07/20/2015   Hyperlipidemia 07/20/2015   Environmental and seasonal allergies 07/20/2015   Spinal stenosis: Chronic back pain  07/20/2015   Neoplasm of connective tissue 11/23/2011    PCP: [name], PA   REFERRING PROVIDER: Tarry Kos, MD  REFERRING DIAG: M17.11 (ICD-10-CM) - Primary osteoarthritis of right knee Z96.651 (ICD-10-CM) - Status post total right knee replacement  THERAPY DIAG:  Acute pain of right knee  Stiffness of right hip, not elsewhere classified  Difficulty in walking,  not elsewhere classified  Localized edema  Muscle weakness (generalized)  Rationale for Evaluation and Treatment: Rehabilitation  ONSET DATE: Rt TKA 07/25/22   SUBJECTIVE:   SUBJECTIVE STATEMENT:Pain still at night that wakes her up, doing good though upon arrival today.   PERTINENT HISTORY: PMH includes Rt TKA,OA, GERD, HLD, HTN, spinal stenosis.  PAIN:  NPRS scale:2/10 upon arrival Pain location: Rt knee  Pain description: small throb and then discomfort/tightness  Aggravating factors: random in WB at times Relieving factors: rest, tylenol, exercises, ice   PRECAUTIONS: None  RED FLAGS: None   WEIGHT BEARING RESTRICTIONS: No  FALLS:  Has patient fallen in  last 6 months? No  LIVING ENVIRONMENT: 15 steps with handrail on left  OCCUPATION: works part time sitting work parking   PLOF: Independent  PATIENT GOALS: be able to drive before 6 weeks.   NEXT MD VISIT: 09/06/22  OBJECTIVE:   PATIENT SURVEYS:  08/29/22 FOTO improved to 61% and met goal for this  08/09/2022 FOTO 43% functional intake, goal is 55%   COGNITION: 08/09/2022 Overall cognitive status: Within functional limits for tasks assessed    EDEMA:  08/09/2022 Moderate edema noted Rt knee at eval  LOWER EXTREMITY ROM:  Active ROM/PROM Right 08/09/2022 Right 08/12/2022 Right 08/17/2022 Right 08/26/2022 Right 08/29/22 Right 09/06/2022  Hip flexion        Hip extension        Hip abduction        Hip adduction        Hip internal rotation        Hip external rotation        Knee flexion A:90 P:95 107 108 AROM in supine heel slide 114 AROM supine 115 AROM 115 AROM in supine heel slide  Knee extension A:4 -2  -2 4 AROM -5 AROM in Seated LAQ  -2 in quad set supine   Ankle dorsiflexion        Ankle plantarflexion        Ankle inversion        Ankle eversion         (Blank rows = not tested)  LOWER EXTREMITY MMT:  MMT in sitting Right 08/09/2022 Right 08/29/22 Right 09/06/2022 Left 09/06/2022  Hip flexion 4 4+    Hip extension      Hip abduction 4 5    Hip adduction      Hip internal rotation      Hip external rotation      Knee flexion 4 5    Knee extension 4 5 5/5 35.3, 34 lbs 5/5 33.9, 35.8 lbs  Ankle dorsiflexion      Ankle plantarflexion      Ankle inversion      Ankle eversion       (Blank rows = not tested)   FUNCTIONAL TESTS:   08/09/2022 Timed up and go (TUG): 10:25 without AD  GAIT: 08/28/22: Gait 500 feet independently in clinic  08/17/2022 :  SPC in Rt UE upon arrival.  Cues and education given for Lt UE use.   08/09/2022 Eval Comments: ambulates in with RW, but able to walk 25 feet X 2 without AD with loose supervision.                     TODAY'S TREATMENT:  DATE:   09/19/2022 Nu step L5 X 8 min LE only Slantboard stretch 30sec X 3 Heel and toe raises 2X10 SLS 30 sec X 3 Seated straight leg raises 2 sets of 10  Seated hamstring stretch 30 sec X 3 Seated knee flexion AAROM 5 sec X10  Functional Activities: Double Leg Press 112# 2X10  slow eccentrics full extension to full flexion Single Leg Press 62# 2 sets of 10 x slow eccentrics full extension to full flexion Step up with Rt and down in front with left using 6 inch step with one UE support X 15 reps   09/15/2022 Recumbent bike Seat 7 for 6 minutes  Seated straight leg raises 3 sets of 5 for 3 seconds  Functional Activities: Double Leg Press 125# 15 x slow eccentrics full extension to full flexion Single Leg Press 62# 2 sets of 10 x slow eccentrics full extension to full flexion Step down off 4 inch step, slow eccentrics 2 sets of 10 with slow eccentrics Step-up and over 4 inch step slow eccentrics 10 x each   Neuromuscular re-education: Single-leg stance 5 x 10 seconds each   09/06/2022 Therex: Recumbent bike Seat 7 for 8 minutes  lvl 2  Single Leg Press 43 lbs 2 x 15 single leg, performed bilaterally  Leg extension machine 10 lbs 2 x 10 double leg up single leg down, performed bilaterally  Lateral step down, performed bilaterally on bottom stair in clinic x 15 with slow lowering focus (education for home use) Forward step up/down on bottom stair in clinic x 15 , performed bilaterally (education for home use)   Additional time spent in review of HEP options that were not machine based as Pt did not have access to things like leg press, knee extension machine etc.     PATIENT EDUCATION: Education details: HEP, PT plan of care Person educated: Patient Education method: Explanation, Demonstration, Verbal cues, and Handouts Education comprehension: verbalized understanding  and needs further education   HOME EXERCISE PROGRAM: Access Code: [email or web address] URL: [email or web address] Date: 08/31/2022 Prepared by: Ivery Quale  Exercises - Supine Heel Slide with Strap  - 2 x daily - 6 x weekly - 1-2 sets - 10 reps - 5 hold - Heel Prop  - 2 x daily - 6 x weekly - 1 sets - 1 reps - 5-10 min hold - Seated Straight Leg Raise with Quad Contraction  - 2 x daily - 6 x weekly - 3 sets - 10 reps - Sit to Stand  - 2 x daily - 6 x weekly - 1-2 sets - 10 reps - Standing Gastroc Stretch at Counter  - 2 x daily - 6 x weekly - 1 sets - 3 reps - 30 hold - Full Leg Press  - 2-3 x weekly - 2-3 sets - 10-15 reps - Hamstring Curl with Weight Machine  - 2-3 x weekly - 2-3 sets - 10-15 reps - Knee Extension with Weight Machine  - 2-3 x weekly - 2-3 sets - 10-15 reps - Recumbent Bike  - 2-3 x weekly  Patient Education - [facility]  AQUATIC HEP Access Code: [email or web address] URL: [email or web address] Date: 09/02/2022 Prepared by: Ivery Quale  Exercises - Forward Walking  - 1 x daily - 2 x weekly - 4 sets - Side Stepping  - 2 x daily - 2 x weekly - 4 sets - 10 reps - Standing Hip Flexion Extension at [employer]  - 1 x daily - 2 x weekly - 15  reps - Standing Hip Abduction Adduction at [facility]  - 1 x daily - 2 x weekly - 20 reps - Forward March  - 1 x daily - 2 x weekly - 4 sets - Backward March  - 1 x daily - 2 x weekly - 4 sets - Squat  - 1 x daily - 2 x weekly - 15 reps - Lunge to [employer] at [employer]  - 1 x daily - 2 x weekly - 1 sets - 20 reps - Standing Single Leg Hip Circles  - 1 x daily - 2 x weekly - 1 sets - 20 reps  ASSESSMENT:  CLINICAL IMPRESSION:  She is doing well overall, still with some pain and tightness but not bad. She has 2 more visits and we will plan to transition to independent program after that.   OBJECTIVE IMPAIRMENTS: decreased activity tolerance, difficulty walking, decreased balance, decreased endurance, decreased  mobility, decreased ROM, decreased strength, impaired flexibility, impaired LE use, and pain.  ACTIVITY LIMITATIONS: bending, lifting, carry, locomotion, cleaning, community activity, driving, and or occupation  PERSONAL FACTORS: PMH includes Rt TKA,OA, GERD, HLD, HTN, spinal stenosis. are also affecting patient's functional outcome.  REHAB POTENTIAL: Good  CLINICAL DECISION MAKING: Stable/uncomplicated  EVALUATION COMPLEXITY: Low    GOALS: Short term PT Goals Target date: 09/06/2022   Pt will be I and compliant with HEP. Baseline:  Goal status: Met 08/12/2022   Long term PT goals Target date:10/16/2022  Pt will improve knee ROM to [facility] 0-110 to improve functional mobility  Goal status: Partially Met 08/26/2022  Pt will improve bilateral knee extension dynamometry to 45 lbs or > for WB functional strength.  Goal status: revised 09/06/2022  Pt will improve FOTO to at least 55% functional to show improved function  Goal status: Met 08/29/22  Pt will reduce pain to overall less than 2-3/10 with usual activity and work activity.  Goal status:  On Going 09/15/2022  Pt will be able to ambulate community distances at least 500 ft WNL gait pattern without complaints  Goal status: MET 08/29/22  6. Pt will improve TUG time to less than 9 seconds to show improved gait speed and sit to stand ability  Goal status: ongoing, 08/29/22  PLAN: PT FREQUENCY: 1-2 times per week   PT DURATION: 6 weeks  PLANNED INTERVENTIONS (unless contraindicated): aquatic PT, Canalith repositioning, cryotherapy, Electrical stimulation, Iontophoresis with 4 mg/ml dexamethasome, Moist heat, traction, Ultrasound, gait training, Therapeutic exercise, balance training, neuromuscular re-education, patient/family education, prosthetic training, manual techniques, passive ROM, dry needling, taping, vasopnuematic device, vestibular, spinal manipulations, joint manipulations  PLAN FOR NEXT SESSION:    Transition to  independent program over last 2 visits.   Ivery Quale, PT, DPT 09/19/22 11:07 AM      Referring diagnosis? M17.11 (ICD-10-CM) - Primary osteoarthritis of right knee Treatment diagnosis? (if different than referring diagnosis) m25.561 What was this (referring dx) caused by? [x]  Surgery []  Fall []  Ongoing issue [x]  Arthritis []  Other: ____________   Laterality: [x]  Rt []  Lt []  Both   Check all possible CPT codes:             *CHOOSE 10 OR LESS*                          [x]  97110 (Therapeutic Exercise)             []  92507 (SLP Treatment)  [x]  08657 (Neuro Re-ed)                           []   95621 (Swallowing Treatment)             [x]  30865 (Gait Training)                           []  [phone] (Cognitive Training, 1st 15 minutes) [x]  97140 (Manual Therapy)                                []  97130 (Cognitive Training, each add'l 15 minutes)   []  97164 (Re-evaluation)                              []  Other, List CPT Code ____________  [x]  97530 (Therapeutic Activities)                                    [x]  97535 (Self Care)                      []  All codes above (97110 - 97535)            []  97012 (Mechanical Traction)            [x]  97014 (E-stim Unattended)            []  97032 (E-stim manual)            []  97033 (Ionto)            []  97035 (Ultrasound) [x]  97750 (Physical Performance Training) [x]  [id number] (Aquatic Therapy) []  97016 (Vasopneumatic Device) []  [id number] (Paraffin) []  97034 (Contrast Bath) []  97597 (Wound Care 1st 20 sq cm) []  97598 (Wound Care each add'l 20 sq cm) []  97760 (Orthotic Fabrication, Fitting, Training Initial) []  [id number] (Prosthetic Management and Training Initial) []  [id number] (Orthotic or Prosthetic Training/ Modification Subsequent)

## 2022-09-22 ENCOUNTER — Encounter: Payer: Self-pay | Admitting: Orthopaedic Surgery

## 2022-09-22 NOTE — Telephone Encounter (Signed)
Yes approve

## 2022-09-26 ENCOUNTER — Encounter: Payer: Self-pay | Admitting: Rehabilitative and Restorative Service Providers"

## 2022-09-26 ENCOUNTER — Ambulatory Visit: Payer: Medicare PPO | Admitting: Rehabilitative and Restorative Service Providers"

## 2022-09-26 DIAGNOSIS — M25651 Stiffness of right hip, not elsewhere classified: Secondary | ICD-10-CM | POA: Diagnosis not present

## 2022-09-26 DIAGNOSIS — M25561 Pain in right knee: Secondary | ICD-10-CM | POA: Diagnosis not present

## 2022-09-26 DIAGNOSIS — R262 Difficulty in walking, not elsewhere classified: Secondary | ICD-10-CM | POA: Diagnosis not present

## 2022-09-26 DIAGNOSIS — M6281 Muscle weakness (generalized): Secondary | ICD-10-CM | POA: Diagnosis not present

## 2022-09-26 DIAGNOSIS — R600 Localized edema: Secondary | ICD-10-CM

## 2022-09-26 NOTE — Therapy (Signed)
OUTPATIENT PHYSICAL THERAPY TREATMENT NOTE    Patient Name: Paula Conway MRN: [id number] DOB:1955/02/09, 67 y.o., female Today's Date: 09/26/2022  END OF SESSION:  PT End of Session - 09/26/22 0829     Visit Number 16    Number of Visits 24    Date for PT Re-Evaluation 10/16/22   to match long term goals   Authorization Type [employer]    Authorization - Visit Number 4    Authorization - Number of Visits 12    Progress Note Due on Visit 12    PT Start Time 0830    PT Stop Time 0910    PT Time Calculation (min) 40 min    Activity Tolerance Patient tolerated treatment well    Behavior During Therapy [facility] for tasks assessed/performed                   Past Medical History:  Diagnosis Date   Allergy    Arthritis    Complication of anesthesia    waking up during colonoscopy and difficulty waking up   Environmental and seasonal allergies 07/20/2015   Family history of adverse reaction to anesthesia    pt's mother and daughter have difficulty waking up   Family history of breast cancer in Mother 07/20/2015   Mother was diagnosed at 13 years old and died age 61 of breast cancer    GERD (gastroesophageal reflux disease)    Headache    History of kidney stones    Hyperlipidemia    Hypertension    stressed induced   Obesity 07/20/2015   Pre-diabetes    Spinal stenosis    Past Surgical History:  Procedure Laterality Date   APPENDECTOMY     BREAST BIOPSY Right    COLONOSCOPY     core biopsy     TOTAL KNEE ARTHROPLASTY Right 07/25/2022   Procedure: RIGHT TOTAL KNEE ARTHROPLASTY;  Surgeon: Tarry Kos, MD;  Location: [facility] OR;  Service: Orthopedics;  Laterality: Right;   VAGINA SURGERY     after having a baby   Patient Active Problem List   Diagnosis Date Noted   Status post total right knee replacement 07/25/2022   Primary osteoarthritis of right knee 07/24/2022   Dysuria 07/05/2022   Bilateral primary osteoarthritis of knee 08/12/2021   Chronic pain of right  knee 08/05/2020   Statin declined 11/16/2018   Grief reaction- loss of husband [name] Aug 8th, 2020 10/02/2018   Dysfunction of both eustachian tubes 07/24/2017   BPPV (benign paroxysmal positional vertigo), unspecified laterality 07/24/2017   Vitamin D insufficiency 03/15/2017   Lumbar discogenic pain syndrome 02/15/2017   Radiculopathy, lumbar region 02/15/2017   Arthritis of facet joints at multiple vertebral levels 02/15/2017   Low back pain 02/15/2017   Obesity, Class I, BMI 30-34.9 08/30/2016   Prediabetes 07/30/2015   Elevated LDL cholesterol level 07/30/2015   Family history of breast cancer in Mother 07/20/2015   GERD (gastroesophageal reflux disease) 07/20/2015   Generalized OA 07/20/2015   Hyperlipidemia 07/20/2015   Environmental and seasonal allergies 07/20/2015   Spinal stenosis: Chronic back pain  07/20/2015   Neoplasm of connective tissue 11/23/2011    PCP: [name], PA   REFERRING PROVIDER: Tarry Kos, MD  REFERRING DIAG: M17.11 (ICD-10-CM) - Primary osteoarthritis of right knee Z96.651 (ICD-10-CM) - Status post total right knee replacement  THERAPY DIAG:  Acute pain of right knee  Stiffness of right hip, not elsewhere classified  Difficulty in walking, not  elsewhere classified  Localized edema  Muscle weakness (generalized)  Rationale for Evaluation and Treatment: Rehabilitation  ONSET DATE: Rt TKA 07/25/22   SUBJECTIVE:   SUBJECTIVE STATEMENT: Pt indicated having "crick" in knee today upon arrival.  Pt indicated some difficulty in uneven surface activity. No pain was indicated with walking into clinic.   PERTINENT HISTORY: PMH includes Rt TKA,OA, GERD, HLD, HTN, spinal stenosis.  PAIN:  NPRS scale: 0/10  Pain location: Rt knee  Pain description: small throb and then discomfort/tightness  Aggravating factors: random in WB at times Relieving factors: rest, tylenol, exercises, ice   PRECAUTIONS: None  RED FLAGS: None   WEIGHT  BEARING RESTRICTIONS: No  FALLS:  Has patient fallen in last 6 months? No  LIVING ENVIRONMENT: 15 steps with handrail on left  OCCUPATION: works part time sitting work parking   PLOF: Independent  PATIENT GOALS: be able to drive before 6 weeks.   NEXT MD VISIT: 09/06/22  OBJECTIVE:   PATIENT SURVEYS:  08/29/22 FOTO improved to 61% and met goal for this  08/09/2022 FOTO 43% functional intake, goal is 55%   COGNITION: 08/09/2022 Overall cognitive status: Within functional limits for tasks assessed    EDEMA:  08/09/2022 Moderate edema noted Rt knee at eval  LOWER EXTREMITY ROM:  Active ROM/PROM Right 08/09/2022 Right 08/12/2022 Right 08/17/2022 Right 08/26/2022 Right 08/29/22 Right 09/06/2022  Hip flexion        Hip extension        Hip abduction        Hip adduction        Hip internal rotation        Hip external rotation        Knee flexion A:90 P:95 107 108 AROM in supine heel slide 114 AROM supine 115 AROM 115 AROM in supine heel slide  Knee extension A:4 -2  -2 4 AROM -5 AROM in Seated LAQ  -2 in quad set supine   Ankle dorsiflexion        Ankle plantarflexion        Ankle inversion        Ankle eversion         (Blank rows = not tested)  LOWER EXTREMITY MMT:  MMT in sitting Right 08/09/2022 Right 08/29/22 Right 09/06/2022 Left 09/06/2022 Right   Hip flexion 4 4+     Hip extension       Hip abduction 4 5     Hip adduction       Hip internal rotation       Hip external rotation       Knee flexion 4 5     Knee extension 4 5 5/5 35.3, 34 lbs 5/5 33.9, 35.8 lbs   Ankle dorsiflexion       Ankle plantarflexion       Ankle inversion       Ankle eversion        (Blank rows = not tested)   FUNCTIONAL TESTS:  09/26/2022:  Lt SLS: 30 seconds        Rt SLS:  20 seconds   08/09/2022 Timed up and go (TUG): 10:25 without AD  GAIT: 08/28/22: Gait 500 feet independently in clinic  08/17/2022 :  SPC in Rt UE upon arrival.  Cues and education given for Lt UE use.    08/09/2022 Eval Comments: ambulates in with RW, but able to walk 25 feet X 2 without AD with loose supervision.  TODAY'S TREATMENT:                                                                      DATE:  09/26/2022 Therex: Recumbent bike lvl 2 10 mins  Leg press double leg 112 lbs x 15, single leg 62 lbs 2 x 15 Rt leg Incline gastroc stretch bilateral 30 sec x 3  Neuro Re-ed Tandem ambulation fwd/back on foam in // bars 6 ft x 6 fwd/back each way 9 inch hurdle step to gait pattern in // bars with occasional HHA 10 ft x 5 leading with each LE 9 inch hurdle lateral step over in // bars with occasional HHA 10 ft x 3 each way SLS with vector reach WB on Rt leg x 10     TODAY'S TREATMENT:                                                                      DATE:  09/19/2022 Nu step L5 X 8 min LE only Slantboard stretch 30sec X 3 Heel and toe raises 2X10 SLS 30 sec X 3 Seated straight leg raises 2 sets of 10  Seated hamstring stretch 30 sec X 3 Seated knee flexion AAROM 5 sec X10  Functional Activities: Double Leg Press 112# 2X10  slow eccentrics full extension to full flexion Single Leg Press 62# 2 sets of 10 x slow eccentrics full extension to full flexion Step up with Rt and down in front with left using 6 inch step with one UE support X 15 reps   TODAY'S TREATMENT:                                                                      DATE:09/15/2022 Recumbent bike Seat 7 for 6 minutes  Seated straight leg raises 3 sets of 5 for 3 seconds  Functional Activities: Double Leg Press 125# 15 x slow eccentrics full extension to full flexion Single Leg Press 62# 2 sets of 10 x slow eccentrics full extension to full flexion Step down off 4 inch step, slow eccentrics 2 sets of 10 with slow eccentrics Step-up and over 4 inch step slow eccentrics 10 x each   Neuromuscular re-education: Single-leg stance 5 x 10 seconds each    PATIENT EDUCATION: Education  details: HEP, PT plan of care Person educated: Patient Education method: Explanation, Demonstration, Verbal cues, and Handouts Education comprehension: verbalized understanding and needs further education   HOME EXERCISE PROGRAM: Access Code: [email or web address] URL: [email or web address] Date: 08/31/2022 Prepared by: Ivery Quale  Exercises - Supine Heel Slide with Strap  - 2 x daily - 6 x weekly - 1-2 sets - 10 reps - 5 hold - Heel Prop  - 2 x daily - 6 x weekly -  1 sets - 1 reps - 5-10 min hold - Seated Straight Leg Raise with Quad Contraction  - 2 x daily - 6 x weekly - 3 sets - 10 reps - Sit to Stand  - 2 x daily - 6 x weekly - 1-2 sets - 10 reps - Standing Gastroc Stretch at Counter  - 2 x daily - 6 x weekly - 1 sets - 3 reps - 30 hold - Full Leg Press  - 2-3 x weekly - 2-3 sets - 10-15 reps - Hamstring Curl with Weight Machine  - 2-3 x weekly - 2-3 sets - 10-15 reps - Knee Extension with Weight Machine  - 2-3 x weekly - 2-3 sets - 10-15 reps - Recumbent Bike  - 2-3 x weekly  Patient Education - [facility]  AQUATIC HEP Access Code: [email or web address] URL: [email or web address] Date: 09/02/2022 Prepared by: Ivery Quale  Exercises - Forward Walking  - 1 x daily - 2 x weekly - 4 sets - Side Stepping  - 2 x daily - 2 x weekly - 4 sets - 10 reps - Standing Hip Flexion Extension at [employer]  - 1 x daily - 2 x weekly - 15 reps - Standing Hip Abduction Adduction at [employer]  - 1 x daily - 2 x weekly - 20 reps - Forward March  - 1 x daily - 2 x weekly - 4 sets - Backward March  - 1 x daily - 2 x weekly - 4 sets - Squat  - 1 x daily - 2 x weekly - 15 reps - Lunge to [employer] at [employer]  - 1 x daily - 2 x weekly - 1 sets - 20 reps - Standing Single Leg Hip Circles  - 1 x daily - 2 x weekly - 1 sets - 20 reps  ASSESSMENT:  CLINICAL IMPRESSION:  Pt demonstrated fair control in dynamic balance activity and could continue to benefit from improvements to  facilitate stability in ambulation on even and uneven surfaces.  Plan to reassess strength next visit and review for possible HEP transitioning.   OBJECTIVE IMPAIRMENTS: decreased activity tolerance, difficulty walking, decreased balance, decreased endurance, decreased mobility, decreased ROM, decreased strength, impaired flexibility, impaired LE use, and pain.  ACTIVITY LIMITATIONS: bending, lifting, carry, locomotion, cleaning, community activity, driving, and or occupation  PERSONAL FACTORS: PMH includes Rt TKA,OA, GERD, HLD, HTN, spinal stenosis. are also affecting patient's functional outcome.  REHAB POTENTIAL: Good  CLINICAL DECISION MAKING: Stable/uncomplicated  EVALUATION COMPLEXITY: Low    GOALS: Short term PT Goals Target date: 09/06/2022   Pt will be I and compliant with HEP. Baseline:  Goal status: Met 08/12/2022   Long term PT goals Target date:10/16/2022  Pt will improve knee ROM to [facility] 0-110 to improve functional mobility  Goal status: Partially Met 08/26/2022  Pt will improve bilateral knee extension dynamometry to 45 lbs or > for WB functional strength.  Goal status: revised 09/06/2022  Pt will improve FOTO to at least 55% functional to show improved function  Goal status: Met 08/29/22  Pt will reduce pain to overall less than 2-3/10 with usual activity and work activity.  Goal status:  On Going 09/15/2022  Pt will be able to ambulate community distances at least 500 ft WNL gait pattern without complaints  Goal status: MET 08/29/22  6. Pt will improve TUG time to less than 9 seconds to show improved gait speed and sit to stand ability  Goal status: ongoing, 08/29/22  PLAN:  PT FREQUENCY: 1-2 times per week   PT DURATION: 6 weeks  PLANNED INTERVENTIONS (unless contraindicated): aquatic PT, Canalith repositioning, cryotherapy, Electrical stimulation, Iontophoresis with 4 mg/ml dexamethasome, Moist heat, traction, Ultrasound, gait training, Therapeutic  exercise, balance training, neuromuscular re-education, patient/family education, prosthetic training, manual techniques, passive ROM, dry needling, taping, vasopnuematic device, vestibular, spinal manipulations, joint manipulations  PLAN FOR NEXT SESSION:    Anticipate discharge planning next visit. Recheck FOTO  Chyrel Masson, PT, DPT, OCS, ATC 09/26/22  9:11 AM       Referring diagnosis? M17.11 (ICD-10-CM) - Primary osteoarthritis of right knee Treatment diagnosis? (if different than referring diagnosis) m25.561 What was this (referring dx) caused by? [x]  Surgery []  Fall []  Ongoing issue [x]  Arthritis []  Other: ____________   Laterality: [x]  Rt []  Lt []  Both   Check all possible CPT codes:             *CHOOSE 10 OR LESS*                          [x]  97110 (Therapeutic Exercise)             []  92507 (SLP Treatment)  [x]  13244 (Neuro Re-ed)                           []  92526 (Swallowing Treatment)             [x]  97116 (Gait Training)                           []  [id number] (Cognitive Training, 1st 15 minutes) [x]  97140 (Manual Therapy)                                []  97130 (Cognitive Training, each add'l 15 minutes)   []  97164 (Re-evaluation)                              []  Other, List CPT Code ____________  [x]  97530 (Therapeutic Activities)                                    [x]  97535 (Self Care)                      []  All codes above (97110 - 97535)            []  97012 (Mechanical Traction)            [x]  97014 (E-stim Unattended)            []  97032 (E-stim manual)            []  97033 (Ionto)            []  97035 (Ultrasound) [x]  97750 (Physical Performance Training) [x]  [id number] (Aquatic Therapy) []  97016 (Vasopneumatic Device) []  [id number] (Paraffin) []  97034 (Contrast Bath) []  97597 (Wound Care 1st 20 sq cm) []  97598 (Wound Care each add'l 20 sq cm) []  97760 (Orthotic Fabrication, Fitting, Training Initial) []  [id number] (Prosthetic Management and Training Initial) []   [id number] (Orthotic or Prosthetic Training/ Modification Subsequent)

## 2022-10-03 ENCOUNTER — Encounter: Payer: Self-pay | Admitting: Rehabilitative and Restorative Service Providers"

## 2022-10-03 ENCOUNTER — Ambulatory Visit: Payer: Medicare PPO | Admitting: Rehabilitative and Restorative Service Providers"

## 2022-10-03 DIAGNOSIS — R600 Localized edema: Secondary | ICD-10-CM

## 2022-10-03 DIAGNOSIS — R262 Difficulty in walking, not elsewhere classified: Secondary | ICD-10-CM | POA: Diagnosis not present

## 2022-10-03 DIAGNOSIS — M25561 Pain in right knee: Secondary | ICD-10-CM | POA: Diagnosis not present

## 2022-10-03 DIAGNOSIS — M25651 Stiffness of right hip, not elsewhere classified: Secondary | ICD-10-CM | POA: Diagnosis not present

## 2022-10-03 DIAGNOSIS — M6281 Muscle weakness (generalized): Secondary | ICD-10-CM | POA: Diagnosis not present

## 2022-10-03 NOTE — Therapy (Signed)
OUTPATIENT PHYSICAL THERAPY TREATMENT NOTE / DISCHARGE     Patient Name: Paula Conway MRN: [id number] DOB:Mar 25, 1955, 67 y.o., female Today's Date: 10/03/2022  PHYSICAL THERAPY DISCHARGE SUMMARY  Visits from Start of Care: 17  Current functional level related to goals / functional outcomes: See note   Remaining deficits: See note   Education / Equipment: HEP  Patient goals were  mostly met . Patient is being discharged due to being pleased with the current functional level.   END OF SESSION:  PT End of Session - 10/03/22 0930     Visit Number 17    Number of Visits 24    Date for PT Re-Evaluation 10/16/22   to match long term goals   Authorization Type [employer]    Authorization - Visit Number 5    Authorization - Number of Visits 12    Progress Note Due on Visit 12    PT Start Time 0927    PT Stop Time 0955    PT Time Calculation (min) 28 min    Activity Tolerance Patient tolerated treatment well    Behavior During Therapy [facility] for tasks assessed/performed                    Past Medical History:  Diagnosis Date   Allergy    Arthritis    Complication of anesthesia    waking up during colonoscopy and difficulty waking up   Environmental and seasonal allergies 07/20/2015   Family history of adverse reaction to anesthesia    pt's mother and daughter have difficulty waking up   Family history of breast cancer in Mother 07/20/2015   Mother was diagnosed at 27 years old and died age 10 of breast cancer    GERD (gastroesophageal reflux disease)    Headache    History of kidney stones    Hyperlipidemia    Hypertension    stressed induced   Obesity 07/20/2015   Pre-diabetes    Spinal stenosis    Past Surgical History:  Procedure Laterality Date   APPENDECTOMY     BREAST BIOPSY Right    COLONOSCOPY     core biopsy     TOTAL KNEE ARTHROPLASTY Right 07/25/2022   Procedure: RIGHT TOTAL KNEE ARTHROPLASTY;  Surgeon: Tarry Kos, MD;  Location: [facility]  OR;  Service: Orthopedics;  Laterality: Right;   VAGINA SURGERY     after having a baby   Patient Active Problem List   Diagnosis Date Noted   Status post total right knee replacement 07/25/2022   Primary osteoarthritis of right knee 07/24/2022   Dysuria 07/05/2022   Bilateral primary osteoarthritis of knee 08/12/2021   Chronic pain of right knee 08/05/2020   Statin declined 11/16/2018   Grief reaction- loss of husband [name] Aug 8th, 2020 10/02/2018   Dysfunction of both eustachian tubes 07/24/2017   BPPV (benign paroxysmal positional vertigo), unspecified laterality 07/24/2017   Vitamin D insufficiency 03/15/2017   Lumbar discogenic pain syndrome 02/15/2017   Radiculopathy, lumbar region 02/15/2017   Arthritis of facet joints at multiple vertebral levels 02/15/2017   Low back pain 02/15/2017   Obesity, Class I, BMI 30-34.9 08/30/2016   Prediabetes 07/30/2015   Elevated LDL cholesterol level 07/30/2015   Family history of breast cancer in Mother 07/20/2015   GERD (gastroesophageal reflux disease) 07/20/2015   Generalized OA 07/20/2015   Hyperlipidemia 07/20/2015   Environmental and seasonal allergies 07/20/2015   Spinal stenosis: Chronic back pain  07/20/2015   Neoplasm  of connective tissue 11/23/2011    PCP: [name], PA   REFERRING PROVIDER: Tarry Kos, MD  REFERRING DIAG: M17.11 (ICD-10-CM) - Primary osteoarthritis of right knee Z96.651 (ICD-10-CM) - Status post total right knee replacement  THERAPY DIAG:  Acute pain of right knee  Stiffness of right hip, not elsewhere classified  Difficulty in walking, not elsewhere classified  Localized edema  Muscle weakness (generalized)  Rationale for Evaluation and Treatment: Rehabilitation  ONSET DATE: Rt TKA 07/25/22   SUBJECTIVE:   SUBJECTIVE STATEMENT: Pt indicated 4/10 when kneeling on knees while gardening.  Overall doing better.  Ready to trial HEP.   PERTINENT HISTORY: PMH includes Rt TKA,OA,  GERD, HLD, HTN, spinal stenosis.  PAIN:  NPRS scale: 0/10  Pain location: Rt knee  Pain description: small throb and then discomfort/tightness  Aggravating factors: random in WB at times Relieving factors: rest, tylenol, exercises, ice   PRECAUTIONS: None  RED FLAGS: None   WEIGHT BEARING RESTRICTIONS: No  FALLS:  Has patient fallen in last 6 months? No  LIVING ENVIRONMENT: 15 steps with handrail on left  OCCUPATION: works part time sitting work parking   PLOF: Independent  PATIENT GOALS: be able to drive before 6 weeks.   NEXT MD VISIT: 09/06/22  OBJECTIVE:   PATIENT SURVEYS:  10/03/2022: FOTO update:  69  08/29/22 FOTO improved to 61% and met goal for this  08/09/2022 FOTO 43% functional intake, goal is 55%   COGNITION: 08/09/2022 Overall cognitive status: Within functional limits for tasks assessed    EDEMA:  08/09/2022 Moderate edema noted Rt knee at eval  LOWER EXTREMITY ROM:  Active ROM/PROM Right 08/09/2022 Right 08/12/2022 Right 08/17/2022 Right 08/26/2022 Right 08/29/22 Right 09/06/2022  Hip flexion        Hip extension        Hip abduction        Hip adduction        Hip internal rotation        Hip external rotation        Knee flexion A:90 P:95 107 108 AROM in supine heel slide 114 AROM supine 115 AROM 115 AROM in supine heel slide  Knee extension A:4 -2  -2 4 AROM -5 AROM in Seated LAQ  -2 in quad set supine   Ankle dorsiflexion        Ankle plantarflexion        Ankle inversion        Ankle eversion         (Blank rows = not tested)  LOWER EXTREMITY MMT:  MMT in sitting Right 08/09/2022 Right 08/29/22 Right 09/06/2022 Left 09/06/2022 Right 10/03/2022  Hip flexion 4 4+     Hip extension       Hip abduction 4 5     Hip adduction       Hip internal rotation       Hip external rotation       Knee flexion 4 5     Knee extension 4 5 5/5 35.3, 34 lbs 5/5 33.9, 35.8 lbs 5/5 49.5, 50.6 lbs  Ankle dorsiflexion       Ankle plantarflexion        Ankle inversion       Ankle eversion        (Blank rows = not tested)   FUNCTIONAL TESTS:  10/03/2022:  TUG independent:  6.5 seconds   09/26/2022:  Lt SLS: 30 seconds        Rt SLS:  20 seconds   08/09/2022 Timed up and go (TUG): 10:25 without AD  GAIT: 08/28/22: Gait 500 feet independently in clinic  08/17/2022 :  SPC in Rt UE upon arrival.  Cues and education given for Lt UE use.   08/09/2022 Eval Comments: ambulates in with RW, but able to walk 25 feet X 2 without AD with loose supervision.                    TODAY'S TREATMENT:                                                                      DATE:  10/03/2022 Therex: Recumbent bike lvl 2 10 mins  Leg press double leg 112 lbs x 15, single leg 62 lbs 2 x 15 Rt leg Incline gastroc stretch bilateral 30 sec x 3 Verbal review for HEP.     TODAY'S TREATMENT:                                                                      DATE:  09/26/2022 Therex: Recumbent bike lvl 2 10 mins  Leg press double leg 112 lbs x 15, single leg 62 lbs 2 x 15 Rt leg Incline gastroc stretch bilateral 30 sec x 3  Neuro Re-ed Tandem ambulation fwd/back on foam in // bars 6 ft x 6 fwd/back each way 9 inch hurdle step to gait pattern in // bars with occasional HHA 10 ft x 5 leading with each LE 9 inch hurdle lateral step over in // bars with occasional HHA 10 ft x 3 each way SLS with vector reach WB on Rt leg x 10     TODAY'S TREATMENT:                                                                      DATE:  09/19/2022 Nu step L5 X 8 min LE only Slantboard stretch 30sec X 3 Heel and toe raises 2X10 SLS 30 sec X 3 Seated straight leg raises 2 sets of 10  Seated hamstring stretch 30 sec X 3 Seated knee flexion AAROM 5 sec X10  Functional Activities: Double Leg Press 112# 2X10  slow eccentrics full extension to full flexion Single Leg Press 62# 2 sets of 10 x slow eccentrics full extension to full flexion Step up with Rt and down in  front with left using 6 inch step with one UE support X 15 reps    PATIENT EDUCATION: Education details: HEP, PT plan of care Person educated: Patient Education method: Explanation, Demonstration, Verbal cues, and Handouts Education comprehension: verbalized understanding and needs further education   HOME EXERCISE PROGRAM: Access Code: [email or web address] URL: [email or web address] Date: 08/31/2022 Prepared by: Ivery Quale  Exercises - Supine  Heel Slide with Strap  - 2 x daily - 6 x weekly - 1-2 sets - 10 reps - 5 hold - Heel Prop  - 2 x daily - 6 x weekly - 1 sets - 1 reps - 5-10 min hold - Seated Straight Leg Raise with Quad Contraction  - 2 x daily - 6 x weekly - 3 sets - 10 reps - Sit to Stand  - 2 x daily - 6 x weekly - 1-2 sets - 10 reps - Standing Gastroc Stretch at Counter  - 2 x daily - 6 x weekly - 1 sets - 3 reps - 30 hold - Full Leg Press  - 2-3 x weekly - 2-3 sets - 10-15 reps - Hamstring Curl with Weight Machine  - 2-3 x weekly - 2-3 sets - 10-15 reps - Knee Extension with Weight Machine  - 2-3 x weekly - 2-3 sets - 10-15 reps - Recumbent Bike  - 2-3 x weekly  Patient Education - [facility]  AQUATIC HEP Access Code: [email or web address] URL: [email or web address] Date: 09/02/2022 Prepared by: Ivery Quale  Exercises - Forward Walking  - 1 x daily - 2 x weekly - 4 sets - Side Stepping  - 2 x daily - 2 x weekly - 4 sets - 10 reps - Standing Hip Flexion Extension at [employer]  - 1 x daily - 2 x weekly - 15 reps - Standing Hip Abduction Adduction at [employer]  - 1 x daily - 2 x weekly - 20 reps - Forward March  - 1 x daily - 2 x weekly - 4 sets - Backward March  - 1 x daily - 2 x weekly - 4 sets - Squat  - 1 x daily - 2 x weekly - 15 reps - Lunge to [employer] at [employer]  - 1 x daily - 2 x weekly - 1 sets - 20 reps - Standing Single Leg Hip Circles  - 1 x daily - 2 x weekly - 1 sets - 20 reps  ASSESSMENT:  CLINICAL IMPRESSION:  Pt has attended  17 visits overall during course of treatment, showing good progress and knowledge in HEP at this time.  See objective data for most recent updates.  Reached established goals in FOTO, strength, mobility.  At this time, recommended for HEP transitioning.  Pt in agreement .  Decreased treatment time today due to discharge.   OBJECTIVE IMPAIRMENTS: decreased activity tolerance, difficulty walking, decreased balance, decreased endurance, decreased mobility, decreased ROM, decreased strength, impaired flexibility, impaired LE use, and pain.  ACTIVITY LIMITATIONS: bending, lifting, carry, locomotion, cleaning, community activity, driving, and or occupation  PERSONAL FACTORS: PMH includes Rt TKA,OA, GERD, HLD, HTN, spinal stenosis. are also affecting patient's functional outcome.  REHAB POTENTIAL: Good  CLINICAL DECISION MAKING: Stable/uncomplicated  EVALUATION COMPLEXITY: Low    GOALS: Short term PT Goals Target date: 09/06/2022   Pt will be I and compliant with HEP. Baseline:  Goal status: Met 08/12/2022   Long term PT goals Target date:10/16/2022  Pt will improve knee ROM to [facility] 0-110 to improve functional mobility  Goal status: Met 10/03/2022  Pt will improve bilateral knee extension dynamometry to 45 lbs or > for WB functional strength.  Goal status: Met 10/03/2022  Pt will improve FOTO to at least 55% functional to show improved function  Goal status: Met 08/29/22  Pt will reduce pain to overall less than 2-3/10 with usual activity and work activity.  Goal status:  partially met  10/03/2022  Pt will be able to ambulate community distances at least 500 ft WNL gait pattern without complaints  Goal status: MET 08/29/22  6. Pt will improve TUG time to less than 9 seconds to show improved gait speed and sit to stand ability  Goal status: Met 10/03/2022  PLAN: PT FREQUENCY: 1-2 times per week   PT DURATION: 6 weeks  PLANNED INTERVENTIONS (unless contraindicated): aquatic PT,  Canalith repositioning, cryotherapy, Electrical stimulation, Iontophoresis with 4 mg/ml dexamethasome, Moist heat, traction, Ultrasound, gait training, Therapeutic exercise, balance training, neuromuscular re-education, patient/family education, prosthetic training, manual techniques, passive ROM, dry needling, taping, vasopnuematic device, vestibular, spinal manipulations, joint manipulations  PLAN FOR NEXT SESSION:    Discharge to HEP.   Chyrel Masson, PT, DPT, OCS, ATC 10/03/22  9:58 AM

## 2022-10-05 ENCOUNTER — Ambulatory Visit (INDEPENDENT_AMBULATORY_CARE_PROVIDER_SITE_OTHER): Payer: Medicare PPO | Admitting: Family Medicine

## 2022-10-05 ENCOUNTER — Encounter: Payer: Self-pay | Admitting: Family Medicine

## 2022-10-05 VITALS — BP 123/69 | HR 67 | Ht 64.0 in | Wt 195.1 lb

## 2022-10-05 DIAGNOSIS — E785 Hyperlipidemia, unspecified: Secondary | ICD-10-CM

## 2022-10-05 DIAGNOSIS — R7303 Prediabetes: Secondary | ICD-10-CM

## 2022-10-05 DIAGNOSIS — K219 Gastro-esophageal reflux disease without esophagitis: Secondary | ICD-10-CM

## 2022-10-05 DIAGNOSIS — E669 Obesity, unspecified: Secondary | ICD-10-CM | POA: Diagnosis not present

## 2022-10-05 NOTE — Patient Instructions (Signed)
Whenever is a good time for you, you can get the tetanus booster updated at [facility]!  We will request copies from them of your last shingles shot and pneumonia shot.

## 2022-10-05 NOTE — Progress Notes (Signed)
Established Patient Office Visit  Subjective   Patient ID: Paula Conway, female    DOB: December 21, 1955  Age: 67 y.o. MRN: [id number]  Chief Complaint  Patient presents with   Medical Management of Chronic Issues    HPI Paula Conway is a 67 y.o. female presenting today for follow up of GERD.  At last appointment restarted 8-week trial of omeprazole 20 mg daily in the morning and added famotidine 20 mg daily at bedtime.  Also recommend avoiding triggering foods. She has noticed a reduction in the frequency of GERD symptoms, but she is still having symptoms particularly at night in spite of therapy.  Outpatient Medications Prior to Visit  Medication Sig   Ascorbic Acid (VITAMIN C) 1000 MG tablet Take 1,000 mg by mouth daily.   Carboxymeth-Glyc-Polysorb PF (REFRESH OPTIVE MEGA-3) 0.5-1-0.5 % SOLN Place 1 drop into both eyes as needed (dry eyes).   Cholecalciferol (VITAMIN D3) 5000 units CAPS Take 5,000 Units by mouth daily.   diclofenac Sodium (VOLTAREN) 1 % GEL Apply 1 Application topically 2 (two) times daily as needed (joint pain).   famotidine (PEPCID) 20 MG tablet Take 1 tablet (20 mg total) by mouth at bedtime.   Lactobacillus-Inulin (CULTURELLE DIGESTIVE HEALTH) CAPS Take 1 capsule by mouth daily.   loratadine (CLARITIN) 10 MG tablet Take 10 mg by mouth every other day.   Multiple Vitamin (MULTIVITAMIN) tablet Take 1 tablet by mouth daily.   omeprazole (PRILOSEC) 20 MG capsule Take 20 mg by mouth daily.   pregabalin (LYRICA) 75 MG capsule Take 1 capsule (75 mg total) by mouth 2 (two) times daily.   Probiotic Product (PROBIOTIC PO) Take 1 tablet by mouth daily.   rosuvastatin (CRESTOR) 5 MG tablet Take 1 tablet (5 mg total) by mouth at bedtime.   Turmeric (QC TUMERIC COMPLEX PO) Take 1 tablet by mouth 2 (two) times a week.   [DISCONTINUED] aspirin EC 81 MG tablet Take 1 tablet (81 mg total) by mouth 2 (two) times daily. To be taken after surgery to prevent blood clots   No  facility-administered medications prior to visit.    ROS Negative unless otherwise noted in HPI   Objective:     BP 123/69   Pulse 67   Ht 5\' 4"  (1.626 m)   Wt 195 lb 1.9 oz (88.5 kg)   SpO2 99%   BMI 33.49 kg/m   Physical Exam Constitutional:      General: She is not in acute distress.    Appearance: Normal appearance.  HENT:     Head: Normocephalic and atraumatic.  Pulmonary:     Effort: Pulmonary effort is normal. No respiratory distress.  Musculoskeletal:     Cervical back: Normal range of motion.  Neurological:     General: No focal deficit present.     Mental Status: She is alert and oriented to person, place, and time. Mental status is at baseline.  Psychiatric:        Mood and Affect: Mood normal.        Thought Content: Thought content normal.        Judgment: Judgment normal.     Assessment & Plan:  Gastroesophageal reflux disease, unspecified whether esophagitis present Assessment & Plan: Patient continues to have GERD symptoms even after extending omeprazole 20 mg daily in the morning an additional 8 weeks and adding famotidine 20 mg daily at bedtime for 8 weeks.  She has been avoiding triggering foods and avoiding eating too close to  bedtime.  Referring to gastroenterology for evaluation and possible endoscopy given that GERD is refractory to initial management.  Patient verbalized understanding and is agreeable to this plan.  She is open to what ever evaluation is necessary.  Orders: -     Ambulatory referral to Gastroenterology    Return in about 3 months (around 01/04/2023) for follow-up for HLD, prediabetes, fasting blood work 1 week before.  After that, return to follow-up every 6 to 12 months with fasting labs to continue following hyperlipidemia and prediabetes.   [name], PA

## 2022-10-05 NOTE — Assessment & Plan Note (Signed)
Patient continues to have GERD symptoms even after extending omeprazole 20 mg daily in the morning an additional 8 weeks and adding famotidine 20 mg daily at bedtime for 8 weeks.  She has been avoiding triggering foods and avoiding eating too close to bedtime.  Referring to gastroenterology for evaluation and possible endoscopy given that GERD is refractory to initial management.  Patient verbalized understanding and is agreeable to this plan.  She is open to what ever evaluation is necessary.

## 2022-10-12 ENCOUNTER — Encounter: Payer: Self-pay | Admitting: Gastroenterology

## 2022-10-18 ENCOUNTER — Encounter: Payer: Self-pay | Admitting: Orthopaedic Surgery

## 2022-10-18 ENCOUNTER — Ambulatory Visit: Payer: Medicare PPO | Admitting: Orthopaedic Surgery

## 2022-10-18 DIAGNOSIS — Z96651 Presence of right artificial knee joint: Secondary | ICD-10-CM

## 2022-10-18 NOTE — Progress Notes (Signed)
Post-Op Visit Note   Patient: Paula Conway           Date of Birth: Aug 03, 1955           MRN: [id number] Visit Date: 10/18/2022 PCP: [name], PA   Assessment & Plan:  Chief Complaint:  Chief Complaint  Patient presents with   Right Knee - Follow-up    Right total knee arthroplasty 07/25/2022   Visit Diagnoses:  1. Status post total right knee replacement     Plan: [name] is 3 months status post right total knee replacement.  She is doing the home exercises.  She has completed outpatient PT.  No real complaints.  Exam of the right knee shows a fully healed surgical scar.  Excellent functional range of motion.  Collaterals are stable.  Minimal swelling.  Normal gait pattern.  [name] is doing very well from her surgery and very pleased with the outcome.  Activity as.  Recheck in 3 months with repeat x-rays of the right knee.  Follow-Up Instructions: Return in about 3 months (around 01/18/2023).   Orders:  No orders of the defined types were placed in this encounter.  No orders of the defined types were placed in this encounter.   Imaging: No results found.  PMFS History: Patient Active Problem List   Diagnosis Date Noted   Primary osteoarthritis of right knee 07/24/2022   Bilateral primary osteoarthritis of knee 08/12/2021   Status post total right knee replacement 08/05/2020   Statin declined 11/16/2018   Grief reaction- loss of husband [name] Aug 8th, 2020 10/02/2018   Dysfunction of both eustachian tubes 07/24/2017   BPPV (benign paroxysmal positional vertigo), unspecified laterality 07/24/2017   Vitamin D insufficiency 03/15/2017   Lumbar discogenic pain syndrome 02/15/2017   Radiculopathy, lumbar region 02/15/2017   Arthritis of facet joints at multiple vertebral levels 02/15/2017   Low back pain 02/15/2017   Obesity, Class I, BMI 30-34.9 08/30/2016   Prediabetes 07/30/2015   Family history of breast cancer in Mother 07/20/2015   GERD  (gastroesophageal reflux disease) 07/20/2015   Generalized OA 07/20/2015   Hyperlipidemia 07/20/2015   Environmental and seasonal allergies 07/20/2015   Spinal stenosis: Chronic back pain  07/20/2015   Neoplasm of connective tissue 11/23/2011   Past Medical History:  Diagnosis Date   Allergy    Arthritis    Complication of anesthesia    waking up during colonoscopy and difficulty waking up   Environmental and seasonal allergies 07/20/2015   Family history of adverse reaction to anesthesia    pt's mother and daughter have difficulty waking up   Family history of breast cancer in Mother 07/20/2015   Mother was diagnosed at 54 years old and died age 47 of breast cancer    GERD (gastroesophageal reflux disease)    Headache    History of kidney stones    Hyperlipidemia    Hypertension    stressed induced   Obesity 07/20/2015   Pre-diabetes    Spinal stenosis     Family History  Problem Relation Age of Onset   Breast cancer Mother    Diabetes Father    Hypertension Father    Heart disease Father     Past Surgical History:  Procedure Laterality Date   APPENDECTOMY     BREAST BIOPSY Right    COLONOSCOPY     core biopsy     TOTAL KNEE ARTHROPLASTY Right 07/25/2022   Procedure: RIGHT TOTAL KNEE ARTHROPLASTY;  Surgeon: Tarry Kos, MD;  Location: [facility] OR;  Service: Orthopedics;  Laterality: Right;   VAGINA SURGERY     after having a baby   Social History   Occupational History   Not on file  Tobacco Use   Smoking status: Former    Current packs/day: 0.00    Average packs/day: 0.3 packs/day for 3.0 years (0.8 ttl pk-yrs)    Types: Cigarettes    Start date: 01/11/1972    Quit date: 01/11/1975    Years since quitting: 47.8    Passive exposure: Past   Smokeless tobacco: Never  Vaping Use   Vaping status: Never Used  Substance and Sexual Activity   Alcohol use: Yes    Comment: socially   Drug use: No   Sexual activity: Yes

## 2022-10-31 ENCOUNTER — Encounter: Payer: Self-pay | Admitting: Orthopaedic Surgery

## 2022-11-01 ENCOUNTER — Telehealth: Payer: Self-pay | Admitting: *Deleted

## 2022-11-01 NOTE — Telephone Encounter (Signed)
(  Late entry for 10/18/22 OV). Seen in office for 90 day Ortho bundle visit. No further CM needs.

## 2022-11-28 ENCOUNTER — Other Ambulatory Visit: Payer: Self-pay | Admitting: Family Medicine

## 2022-11-28 DIAGNOSIS — K219 Gastro-esophageal reflux disease without esophagitis: Secondary | ICD-10-CM

## 2022-11-28 DIAGNOSIS — E785 Hyperlipidemia, unspecified: Secondary | ICD-10-CM

## 2022-12-26 ENCOUNTER — Other Ambulatory Visit: Payer: Medicare PPO

## 2022-12-26 DIAGNOSIS — E66811 Obesity, class 1: Secondary | ICD-10-CM

## 2022-12-26 DIAGNOSIS — R7303 Prediabetes: Secondary | ICD-10-CM

## 2022-12-26 DIAGNOSIS — E785 Hyperlipidemia, unspecified: Secondary | ICD-10-CM

## 2022-12-27 LAB — CBC WITH DIFFERENTIAL/PLATELET
Basophils Absolute: 0.1 10*3/uL (ref 0.0–0.2)
Basos: 1 %
EOS (ABSOLUTE): 0.2 10*3/uL (ref 0.0–0.4)
Eos: 3 %
Hematocrit: 41.6 % (ref 34.0–46.6)
Hemoglobin: 13.6 g/dL (ref 11.1–15.9)
Immature Grans (Abs): 0 10*3/uL (ref 0.0–0.1)
Immature Granulocytes: 1 %
Lymphocytes Absolute: 2.1 10*3/uL (ref 0.7–3.1)
Lymphs: 38 %
MCH: 29.4 pg (ref 26.6–33.0)
MCHC: 32.7 g/dL (ref 31.5–35.7)
MCV: 90 fL (ref 79–97)
Monocytes Absolute: 0.5 10*3/uL (ref 0.1–0.9)
Monocytes: 8 %
Neutrophils Absolute: 2.8 10*3/uL (ref 1.4–7.0)
Neutrophils: 49 %
Platelets: 234 10*3/uL (ref 150–450)
RBC: 4.63 x10E6/uL (ref 3.77–5.28)
RDW: 14.3 % (ref 11.7–15.4)
WBC: 5.6 10*3/uL (ref 3.4–10.8)

## 2022-12-27 LAB — COMPREHENSIVE METABOLIC PANEL
ALT: 17 [IU]/L (ref 0–32)
AST: 20 [IU]/L (ref 0–40)
Albumin: 4.4 g/dL (ref 3.9–4.9)
Alkaline Phosphatase: 97 [IU]/L (ref 44–121)
BUN/Creatinine Ratio: 24 (ref 12–28)
BUN: 19 mg/dL (ref 8–27)
Bilirubin Total: 0.4 mg/dL (ref 0.0–1.2)
CO2: 20 mmol/L (ref 20–29)
Calcium: 9.7 mg/dL (ref 8.7–10.3)
Chloride: 100 mmol/L (ref 96–106)
Creatinine, Ser: 0.78 mg/dL (ref 0.57–1.00)
Globulin, Total: 2.6 g/dL (ref 1.5–4.5)
Glucose: 88 mg/dL (ref 70–99)
Potassium: 4.6 mmol/L (ref 3.5–5.2)
Sodium: 138 mmol/L (ref 134–144)
Total Protein: 7 g/dL (ref 6.0–8.5)
eGFR: 83 mL/min/{1.73_m2} (ref >59)

## 2022-12-27 LAB — LIPID PANEL
Chol/HDL Ratio: 3.5 {ratio} (ref 0.0–4.4)
Cholesterol, Total: 185 mg/dL (ref 100–199)
HDL: 53 mg/dL (ref >39)
LDL Chol Calc (NIH): 100 mg/dL — ABNORMAL HIGH (ref 0–99)
Triglycerides: 185 mg/dL — ABNORMAL HIGH (ref 0–149)
VLDL Cholesterol Cal: 32 mg/dL (ref 5–40)

## 2022-12-27 LAB — HEMOGLOBIN A1C
Est. average glucose Bld gHb Est-mCnc: 137 mg/dL
Hgb A1c MFr Bld: 6.4 % — ABNORMAL HIGH (ref 4.8–5.6)

## 2023-01-02 ENCOUNTER — Ambulatory Visit: Payer: Medicare PPO | Admitting: Family Medicine

## 2023-01-10 ENCOUNTER — Ambulatory Visit: Payer: Medicare PPO | Admitting: Family Medicine

## 2023-01-10 VITALS — BP 138/62 | HR 72 | Temp 97.9°F | Ht 64.0 in | Wt 200.0 lb

## 2023-01-10 DIAGNOSIS — R7303 Prediabetes: Secondary | ICD-10-CM

## 2023-01-10 DIAGNOSIS — E782 Mixed hyperlipidemia: Secondary | ICD-10-CM

## 2023-01-10 NOTE — Assessment & Plan Note (Signed)
 Last lipid panel: LDL 100, HDL 53, triglycerides 185. LDL increased to 100 from 75, triglycerides remain elevated at 185.  Patient has been out of her typical routine with the recent holidays.  Continue rosuvastatin  5 mg daily and recheck lipid panel and hepatic functioning in about 5 months.  Hoping these will return to baseline of LDL <70 and triglycerides <849.

## 2023-01-10 NOTE — Patient Instructions (Signed)
It looks like you are due for your tetanus booster.  You can have this at the pharmacy like [facility] or [facility] and it will protect you for 10 years!

## 2023-01-10 NOTE — Progress Notes (Signed)
 Established Patient Office Visit  Subjective   Patient ID: Paula Conway, female    DOB: [birth year]  Age: 67 y.o. MRN: [id number]  Chief Complaint  Patient presents with   Follow-up    HPI Paula Conway is a 68 y.o. female presenting today for follow up of hyperlipidemia, prediabetes.  With the recent holidays, she was not surprised to see an increase in her cholesterol and A1c lab values.  She has been eating more sugar/carbs than she typically does. Hyperlipidemia: tolerating rosuvastatin  well with no myalgias or significant side effects.  The 10-year ASCVD risk score (Arnett DK, et al., 2019) is: 7.8%  Prediabetes: denies hypoglycemic events, wounds or sores that are not healing well, increased thirst or urination.   Outpatient Medications Prior to Visit  Medication Sig   Ascorbic Acid (VITAMIN C) 1000 MG tablet Take 1,000 mg by mouth daily.   Carboxymeth-Glyc-Polysorb PF (REFRESH OPTIVE MEGA-3) 0.5-1-0.5 % SOLN Place 1 drop into both eyes as needed (dry eyes).   Cholecalciferol (VITAMIN D3) 5000 units CAPS Take 5,000 Units by mouth daily.   diclofenac Sodium (VOLTAREN) 1 % GEL Apply 1 Application topically 2 (two) times daily as needed (joint pain).   famotidine  (PEPCID ) 20 MG tablet TAKE 1 TABLET (20 MG TOTAL) BY MOUTH AT BEDTIME.   Lactobacillus-Inulin (CULTURELLE DIGESTIVE HEALTH) CAPS Take 1 capsule by mouth daily.   loratadine (CLARITIN) 10 MG tablet Take 10 mg by mouth every other day.   Multiple Vitamin (MULTIVITAMIN) tablet Take 1 tablet by mouth daily.   omeprazole (PRILOSEC) 20 MG capsule Take 20 mg by mouth daily.   pregabalin  (LYRICA ) 75 MG capsule Take 1 capsule (75 mg total) by mouth 2 (two) times daily.   Probiotic Product (PROBIOTIC PO) Take 1 tablet by mouth daily.   rosuvastatin  (CRESTOR ) 5 MG tablet TAKE 1 TABLET (5 MG TOTAL) BY MOUTH AT BEDTIME.   Turmeric (QC TUMERIC COMPLEX PO) Take 1 tablet by mouth 2 (two) times a week.   No facility-administered  medications prior to visit.    ROS Negative unless otherwise noted in HPI   Objective:     BP 138/62   Pulse 72   Temp 97.9 F (36.6 C) (Temporal)   Ht 5' 4 (1.626 m)   Wt 200 lb (90.7 kg)   SpO2 97%   BMI 34.33 kg/m   Physical Exam Constitutional:      General: She is not in acute distress.    Appearance: Normal appearance.  HENT:     Head: Normocephalic and atraumatic.  Cardiovascular:     Rate and Rhythm: Normal rate and regular rhythm.     Heart sounds: No murmur heard.    No friction rub. No gallop.  Pulmonary:     Effort: Pulmonary effort is normal. No respiratory distress.     Breath sounds: No wheezing, rhonchi or rales.  Skin:    General: Skin is warm and dry.  Neurological:     Mental Status: She is alert and oriented to person, place, and time.     Assessment & Plan:  Mixed hyperlipidemia Assessment & Plan: Last lipid panel: LDL 100, HDL 53, triglycerides 185. LDL increased to 100 from 75, triglycerides remain elevated at 185.  Patient has been out of her typical routine with the recent holidays.  Continue rosuvastatin  5 mg daily and recheck lipid panel and hepatic functioning in about 5 months.  Hoping these will return to baseline of LDL <70 and triglycerides <849.  Prediabetes Assessment & Plan: A1c increased to 6.4, patient knows she has been eating more sweets than normal and plans to return to a low-carb/sugar diet and increased physical activity now that the holidays are over.     Return in about 5 months (around 06/10/2023) for follow-up for HLD, preDM, fasting blood work 1 week before.    Joesph DELENA Sear, PA

## 2023-01-10 NOTE — Assessment & Plan Note (Signed)
A1c increased to 6.4, patient knows she has been eating more sweets than normal and plans to return to a low-carb/sugar diet and increased physical activity now that the holidays are over.

## 2023-01-17 ENCOUNTER — Ambulatory Visit: Payer: Medicare PPO | Admitting: Gastroenterology

## 2023-01-17 ENCOUNTER — Encounter: Payer: Self-pay | Admitting: Gastroenterology

## 2023-01-17 VITALS — BP 136/90 | HR 76 | Ht 64.0 in | Wt 198.2 lb

## 2023-01-17 DIAGNOSIS — K219 Gastro-esophageal reflux disease without esophagitis: Secondary | ICD-10-CM

## 2023-01-17 NOTE — Progress Notes (Signed)
 Discussed the use of AI scribe software for clinical note transcription with the patient, who gave verbal consent to proceed.  HPI : Paula Conway is a 68 y.o. female who is referred to us  by Wallace Joesph LABOR, PA for further evaluation and management of GERD symptoms.  She reports a long history of infrequent GERD symptoms, but states that over the past year, her symptoms increased in severity and frequency.     The patient notes that the reflux is particularly severe if she eats late in the evening, and she has made efforts to avoid late meals. She also reports that certain foods, particularly spicy foods, exacerbate her symptoms.   Her symptoms are primarily nocturnal and consists of severe burning pain in the chest and throat.  She would frequently be awakened by the symptoms.  She has been taking omeprazole in the morning and Pepcid  in the evening, and this has greatly improved her symptoms.  Currently, she estimates having breakthrough symptoms may be once a week, and often this is related to dietary indiscretion.  The patient denies any dysphagia, hematemesis, or abdominal pain, and her weight has remained stable. She has a remote history of brief smoking but quit 49 years ago. There is no family history of gastrointestinal cancers.   The patient has never undergone an upper endoscopy.  She had a colonoscopy in 2010 which was normal.  She had a Cologuard in May 2023 which was negative. She denies any concerning chronic lower GI symptoms such as abdominal pain, constipation, diarrhea or blood in her stool.   Colonoscopy 2010 External hemorrhoids/skin tags Otherwise normal colonoscopy Recommended to repeat 5 years  Past Medical History:  Diagnosis Date   Allergy    Arthritis    Complication of anesthesia    waking up during colonoscopy and difficulty waking up   Environmental and seasonal allergies 07/20/2015   Family history of adverse reaction to anesthesia    pt's mother  and daughter have difficulty waking up   Family history of breast cancer in Mother 07/20/2015   Mother was diagnosed at 33 years old and died age 67 of breast cancer    GERD (gastroesophageal reflux disease)    Headache    History of kidney stones    Hyperlipidemia    Hypertension    stressed induced   Obesity 07/20/2015   Pre-diabetes    Spinal stenosis      Past Surgical History:  Procedure Laterality Date   APPENDECTOMY     BREAST BIOPSY Right    COLONOSCOPY     core biopsy     TOTAL KNEE ARTHROPLASTY Right 07/25/2022   Procedure: RIGHT TOTAL KNEE ARTHROPLASTY;  Surgeon: Jerri Kay HERO, MD;  Location: [facility] OR;  Service: Orthopedics;  Laterality: Right;   VAGINA SURGERY     after having a baby   Family History  Problem Relation Age of Onset   Breast cancer Mother    Diabetes Father    Hypertension Father    Heart disease Father    Social History   Tobacco Use   Smoking status: Former    Current packs/day: 0.00    Average packs/day: 0.3 packs/day for 3.0 years (0.8 ttl pk-yrs)    Types: Cigarettes    Start date: 01/11/1972    Quit date: 01/11/1975    Years since quitting: 48.0    Passive exposure: Past   Smokeless tobacco: Never  Vaping Use   Vaping status: Never Used  Substance Use  Topics   Alcohol use: Yes    Comment: socially   Drug use: No   Current Outpatient Medications  Medication Sig Dispense Refill   Ascorbic Acid (VITAMIN C) 1000 MG tablet Take 1,000 mg by mouth daily.     Carboxymeth-Glyc-Polysorb PF (REFRESH OPTIVE MEGA-3) 0.5-1-0.5 % SOLN Place 1 drop into both eyes as needed (dry eyes).     Cholecalciferol (VITAMIN D3) 5000 units CAPS Take 5,000 Units by mouth daily.     diclofenac Sodium (VOLTAREN) 1 % GEL Apply 1 Application topically 2 (two) times daily as needed (joint pain).     famotidine  (PEPCID ) 20 MG tablet TAKE 1 TABLET (20 MG TOTAL) BY MOUTH AT BEDTIME. 90 tablet 1   Lactobacillus-Inulin (CULTURELLE DIGESTIVE HEALTH) CAPS Take 1 capsule by  mouth daily.     loratadine (CLARITIN) 10 MG tablet Take 10 mg by mouth every other day.     Multiple Vitamin (MULTIVITAMIN) tablet Take 1 tablet by mouth daily.     omeprazole (PRILOSEC) 20 MG capsule Take 20 mg by mouth daily.     pregabalin  (LYRICA ) 75 MG capsule Take 1 capsule (75 mg total) by mouth 2 (two) times daily. 180 capsule 1   Probiotic Product (PROBIOTIC PO) Take 1 tablet by mouth daily.     rosuvastatin  (CRESTOR ) 5 MG tablet TAKE 1 TABLET (5 MG TOTAL) BY MOUTH AT BEDTIME. 90 tablet 1   Turmeric (QC TUMERIC COMPLEX PO) Take 1 tablet by mouth 2 (two) times a week.     No current facility-administered medications for this visit.   No Known Allergies   Review of Systems: All systems reviewed and negative except where noted in HPI.    No results found.  Physical Exam: BP (!) 136/90   Pulse 76   Ht 5' 4 (1.626 m)   Wt 198 lb 3.2 oz (89.9 kg)   BMI 34.02 kg/m  Constitutional: Pleasant,well-developed, Caucasian female in no acute distress. HEENT: Normocephalic and atraumatic. Conjunctivae are normal. No scleral icterus. Neck supple.  Cardiovascular: Normal rate, regular rhythm.  Pulmonary/chest: Effort normal and breath sounds normal. No wheezing, rales or rhonchi. Abdominal: Soft, nondistended, nontender. Bowel sounds active throughout. There are no masses palpable. No hepatomegaly. Extremities: no edema Lymphadenopathy: No cervical adenopathy noted. Neurological: Alert and oriented to person place and time. Skin: Skin is warm and dry. No rashes noted. Psychiatric: Normal mood and affect. Behavior is normal.  CBC    Component Value Date/Time   WBC 5.6 12/26/2022 0908   WBC 11.3 (H) 07/26/2022 0012   RBC 4.63 12/26/2022 0908   RBC 3.55 (L) 07/26/2022 0012   HGB 13.6 12/26/2022 0908   HCT 41.6 12/26/2022 0908   PLT 234 12/26/2022 0908   MCV 90 12/26/2022 0908   MCH 29.4 12/26/2022 0908   MCH 29.6 07/26/2022 0012   MCHC 32.7 12/26/2022 0908   MCHC 32.8  07/26/2022 0012   RDW 14.3 12/26/2022 0908   LYMPHSABS 2.1 12/26/2022 0908   MONOABS 0.8 07/10/2019 1132   EOSABS 0.2 12/26/2022 0908   BASOSABS 0.1 12/26/2022 0908    CMP     Component Value Date/Time   NA 138 12/26/2022 0908   K 4.6 12/26/2022 0908   CL 100 12/26/2022 0908   CO2 20 12/26/2022 0908   GLUCOSE 88 12/26/2022 0908   GLUCOSE 104 (H) 07/11/2022 1048   BUN 19 12/26/2022 0908   CREATININE 0.78 12/26/2022 0908   CREATININE 0.90 07/21/2015 0822   CALCIUM  9.7 12/26/2022 0908  PROT 7.0 12/26/2022 0908   ALBUMIN 4.4 12/26/2022 0908   AST 20 12/26/2022 0908   ALT 17 12/26/2022 0908   ALKPHOS 97 12/26/2022 0908   BILITOT 0.4 12/26/2022 0908   GFRNONAA >60 07/11/2022 1048   GFRNONAA 70 07/21/2015 0822   GFRAA 77 09/02/2019 0932   GFRAA 80 07/21/2015 0822       Latest Ref Rng & Units 12/26/2022    9:08 AM 07/26/2022   12:12 AM 07/11/2022   10:48 AM  CBC EXTENDED  WBC 3.4 - 10.8 x10E3/uL 5.6  11.3  6.0   RBC 3.77 - 5.28 x10E6/uL 4.63  3.55  4.63   Hemoglobin 11.1 - 15.9 g/dL 86.3  89.4  86.7   HCT 34.0 - 46.6 % 41.6  32.0  41.4   Platelets 150 - 450 x10E3/uL 234  165  235   NEUT# 1.4 - 7.0 x10E3/uL 2.8     Lymph# 0.7 - 3.1 x10E3/uL 2.1         ASSESSMENT AND PLAN:  68 year old female with history of mild, intermittent reflux, with recent worsening of symptoms earlier in the year, which have improved with initiation of daily PPI and histamine receptor antagonist therapy.  She does still have occasional breakthrough symptoms, but these are mostly due to dietary indiscretions.  She has no red flag symptoms.  Given response to acid suppression and absence of red flag symptoms, upper endoscopy not absolutely necessary.  Patient does not meet guideline criteria for Barrett's screening.  Gastroesophageal Reflux Disease (GERD) Chronic GERD symptoms for 6-12 months, primarily nocturnal reflux with occasional daytime heartburn. Symptoms improved with omeprazole AM and  Pepcid  PM but still occur about once a week. No dysphagia, weight loss, or anemia. Discussed GERD pathophysiology, lifestyle modifications, and long-term risks of acid-reducing medications, particularly decreased bone density in postmenopausal females. Patient prefers medications over dietary changes. - Continue omeprazole AM and Pepcid  PM - Provide GERD handout - If bone density scan shows decreased bone density, discontinue omeprazole and switch to Pepcid  BID - Advise on lifestyle modifications: avoid eating within 3-4 hours of bedtime, elevate head of bed, avoid peppermint, consider low-carb diet - Return if symptoms worsen or new symptoms (dysphagia, unintentional weight loss) occur  General Health Maintenance Up to date on colon cancer screening with negative Cologuard in summer 2023. Bone density scan scheduled for tomorrow. - Continue routine colon cancer screening with Cologuard - Proceed with bone density scan.     Naomii Kreger E. Stacia, MD [facility]    Wallace Joesph LABOR, [place]

## 2023-01-17 NOTE — Patient Instructions (Addendum)
 _______________________________________________________  If your blood pressure at your visit was 140/90 or greater, please contact your primary care physician to follow up on this.  _______________________________________________________  If you are age 68 or older, your body mass index should be between 23-30. Your Body mass index is 34.02 kg/m. If this is out of the aforementioned range listed, please consider follow up with your Primary Care Provider.  If you are age 71 or younger, your body mass index should be between 19-25. Your Body mass index is 34.02 kg/m. If this is out of the aformentioned range listed, please consider follow up with your Primary Care Provider.   ________________________________________________________  The [facility] providers would like to encourage you to use MYCHART to communicate with providers for non-urgent requests or questions.  Due to long hold times on the telephone, sending your provider a message by [facility] may be a faster and more efficient way to get a response.  Please allow 48 business hours for a response.  Please remember that this is for non-urgent requests.  _______________________________________________________  Paula Conway have been given a GERD handout  Continue your omeprazole and Pepcid . If your bone density scan is low stop omeprazole  Please call with any questions or concerns  It was a pleasure to see you today!  Thank you for trusting me with your gastrointestinal care!

## 2023-01-18 ENCOUNTER — Ambulatory Visit: Payer: Medicare PPO | Admitting: Physician Assistant

## 2023-01-18 ENCOUNTER — Encounter: Payer: Self-pay | Admitting: Orthopaedic Surgery

## 2023-01-18 ENCOUNTER — Ambulatory Visit
Admission: RE | Admit: 2023-01-18 | Discharge: 2023-01-18 | Disposition: A | Discharge status: Home / Self Care | Payer: Medicare PPO | Source: Ambulatory Visit | Attending: Family Medicine | Admitting: Family Medicine

## 2023-01-18 ENCOUNTER — Encounter: Payer: Self-pay | Admitting: Family Medicine

## 2023-01-18 ENCOUNTER — Ambulatory Visit (INDEPENDENT_AMBULATORY_CARE_PROVIDER_SITE_OTHER): Payer: Self-pay

## 2023-01-18 DIAGNOSIS — Z13820 Encounter for screening for osteoporosis: Secondary | ICD-10-CM

## 2023-01-18 DIAGNOSIS — Z96651 Presence of right artificial knee joint: Secondary | ICD-10-CM

## 2023-01-18 NOTE — Progress Notes (Addendum)
 Post-Op Visit Note   Patient: Paula Conway           Date of Birth: 21-Oct-1955           MRN: [id number] Visit Date: 01/18/2023 PCP: Wallace Joesph LABOR, PA   Assessment & Plan:  Chief Complaint:  Chief Complaint  Patient presents with   Right Knee - Follow-up    Right total knee arthroplasty 07/25/2022   Visit Diagnoses:  1. Status post total right knee replacement     Plan: Patient is a pleasant 68 year old female who comes in today 6 months status post right total knee replacement 07/25/2022.  She has been doing relatively well but does note occasional discomfort which is relieved with Tylenol .  Examination of her right knee reveals range of motion from 0 to 130 degrees.  Stable to valgus varus stress.  She is neurovascularly intact distally.  At this point, she will continue to advance with activity as tolerated.  Dental prophylaxis reinforced.  Follow-up in 6 months for repeat evaluation and 2 view x-rays of the right knee.  Call with concerns or questions.  Follow-Up Instructions: Return in about 6 months (around 07/18/2023).   Orders:  Orders Placed This Encounter  Procedures   XR Knee 1-2 Views Right   No orders of the defined types were placed in this encounter.   Imaging: XR Knee 1-2 Views Right Result Date: 01/18/2023 X-rays of the right knee demonstrate a stable right total knee replacement in good alignment    PMFS History: Patient Active Problem List   Diagnosis Date Noted   Primary osteoarthritis of right knee 07/24/2022   Bilateral primary osteoarthritis of knee 08/12/2021   Status post total right knee replacement 08/05/2020   Grief reaction- loss of husband Lynwood Aug 8th, 2020 10/02/2018   Dysfunction of both eustachian tubes 07/24/2017   BPPV (benign paroxysmal positional vertigo), unspecified laterality 07/24/2017   Vitamin D  insufficiency 03/15/2017   Lumbar discogenic pain syndrome 02/15/2017   Radiculopathy, lumbar region 02/15/2017   Arthritis  of facet joints at multiple vertebral levels 02/15/2017   Low back pain 02/15/2017   Obesity, Class I, BMI 30-34.9 08/30/2016   Prediabetes 07/30/2015   Family history of breast cancer in Mother 07/20/2015   GERD (gastroesophageal reflux disease) 07/20/2015   Generalized OA 07/20/2015   Hyperlipidemia 07/20/2015   Environmental and seasonal allergies 07/20/2015   Spinal stenosis: Chronic back pain  07/20/2015   Neoplasm of connective tissue 11/23/2011   Past Medical History:  Diagnosis Date   Allergy    Arthritis    Complication of anesthesia    waking up during colonoscopy and difficulty waking up   Environmental and seasonal allergies 07/20/2015   Family history of adverse reaction to anesthesia    pt's mother and daughter have difficulty waking up   Family history of breast cancer in Mother 07/20/2015   Mother was diagnosed at 62 years old and died age 69 of breast cancer    GERD (gastroesophageal reflux disease)    Headache    History of kidney stones    Hyperlipidemia    Hypertension    stressed induced   Obesity 07/20/2015   Pre-diabetes    Spinal stenosis     Family History  Problem Relation Age of Onset   Breast cancer Mother    Diabetes Father    Hypertension Father    Heart disease Father     Past Surgical History:  Procedure Laterality Date   APPENDECTOMY  BREAST BIOPSY Right    COLONOSCOPY     core biopsy     TOTAL KNEE ARTHROPLASTY Right 07/25/2022   Procedure: RIGHT TOTAL KNEE ARTHROPLASTY;  Surgeon: Jerri Kay HERO, MD;  Location: [facility] OR;  Service: Orthopedics;  Laterality: Right;   VAGINA SURGERY     after having a baby   Social History   Occupational History   Not on file  Tobacco Use   Smoking status: Former    Current packs/day: 0.00    Average packs/day: 0.3 packs/day for 3.0 years (0.8 ttl pk-yrs)    Types: Cigarettes    Start date: 01/11/1972    Quit date: 01/11/1975    Years since quitting: 48.0    Passive exposure: Past   Smokeless  tobacco: Never  Vaping Use   Vaping status: Never Used  Substance and Sexual Activity   Alcohol use: Yes    Comment: socially   Drug use: No   Sexual activity: Yes

## 2023-01-18 NOTE — Addendum Note (Signed)
 Addended by: Mayra Reel on: 01/18/2023 11:45 AM   Modules accepted: Level of Service

## 2023-02-01 ENCOUNTER — Other Ambulatory Visit: Payer: Self-pay | Admitting: Family Medicine

## 2023-02-01 DIAGNOSIS — M4800 Spinal stenosis, site unspecified: Secondary | ICD-10-CM

## 2023-02-16 ENCOUNTER — Encounter: Payer: Self-pay | Admitting: Family Medicine

## 2023-05-23 ENCOUNTER — Other Ambulatory Visit: Payer: Self-pay | Admitting: Family Medicine

## 2023-05-23 DIAGNOSIS — K219 Gastro-esophageal reflux disease without esophagitis: Secondary | ICD-10-CM

## 2023-05-23 DIAGNOSIS — E785 Hyperlipidemia, unspecified: Secondary | ICD-10-CM

## 2023-05-29 ENCOUNTER — Other Ambulatory Visit: Payer: Self-pay

## 2023-05-29 ENCOUNTER — Encounter: Payer: Self-pay | Admitting: Orthopaedic Surgery

## 2023-05-29 MED ORDER — AMOXICILLIN 500 MG PO TABS: ORAL_TABLET | ORAL | 2 refills | Status: AC

## 2023-06-06 ENCOUNTER — Other Ambulatory Visit: Payer: Medicare PPO

## 2023-06-06 ENCOUNTER — Other Ambulatory Visit: Payer: Self-pay | Admitting: *Deleted

## 2023-06-06 DIAGNOSIS — E559 Vitamin D deficiency, unspecified: Secondary | ICD-10-CM

## 2023-06-06 DIAGNOSIS — E66811 Obesity, class 1: Secondary | ICD-10-CM

## 2023-06-06 DIAGNOSIS — E785 Hyperlipidemia, unspecified: Secondary | ICD-10-CM

## 2023-06-06 DIAGNOSIS — R7303 Prediabetes: Secondary | ICD-10-CM

## 2023-06-07 ENCOUNTER — Ambulatory Visit: Payer: Self-pay | Admitting: Family Medicine

## 2023-06-07 LAB — CBC WITH DIFFERENTIAL/PLATELET
Basophils Absolute: 0 10*3/uL (ref 0.0–0.2)
Basos: 1 %
EOS (ABSOLUTE): 0.2 10*3/uL (ref 0.0–0.4)
Eos: 3 %
Hematocrit: 40.4 % (ref 34.0–46.6)
Hemoglobin: 12.7 g/dL (ref 11.1–15.9)
Immature Grans (Abs): 0 10*3/uL (ref 0.0–0.1)
Immature Granulocytes: 0 %
Lymphocytes Absolute: 1.9 10*3/uL (ref 0.7–3.1)
Lymphs: 35 %
MCH: 29.3 pg (ref 26.6–33.0)
MCHC: 31.4 g/dL — ABNORMAL LOW (ref 31.5–35.7)
MCV: 93 fL (ref 79–97)
Monocytes Absolute: 0.4 10*3/uL (ref 0.1–0.9)
Monocytes: 8 %
Neutrophils Absolute: 2.8 10*3/uL (ref 1.4–7.0)
Neutrophils: 53 %
Platelets: 208 10*3/uL (ref 150–450)
RBC: 4.34 x10E6/uL (ref 3.77–5.28)
RDW: 13.2 % (ref 11.7–15.4)
WBC: 5.3 10*3/uL (ref 3.4–10.8)

## 2023-06-07 LAB — COMPREHENSIVE METABOLIC PANEL WITH GFR
ALT: 16 IU/L (ref 0–32)
AST: 22 IU/L (ref 0–40)
Albumin: 4.5 g/dL (ref 3.9–4.9)
Alkaline Phosphatase: 78 IU/L (ref 44–121)
BUN/Creatinine Ratio: 16 (ref 12–28)
BUN: 14 mg/dL (ref 8–27)
Bilirubin Total: 0.3 mg/dL (ref 0.0–1.2)
CO2: 20 mmol/L (ref 20–29)
Calcium: 9.2 mg/dL (ref 8.7–10.3)
Chloride: 102 mmol/L (ref 96–106)
Creatinine, Ser: 0.87 mg/dL (ref 0.57–1.00)
Globulin, Total: 2.4 g/dL (ref 1.5–4.5)
Glucose: 85 mg/dL (ref 70–99)
Potassium: 4.4 mmol/L (ref 3.5–5.2)
Sodium: 136 mmol/L (ref 134–144)
Total Protein: 6.9 g/dL (ref 6.0–8.5)
eGFR: 73 mL/min/{1.73_m2} (ref >59)

## 2023-06-07 LAB — LIPID PANEL
Chol/HDL Ratio: 2.9 ratio (ref 0.0–4.4)
Cholesterol, Total: 169 mg/dL (ref 100–199)
HDL: 58 mg/dL (ref >39)
LDL Chol Calc (NIH): 80 mg/dL (ref 0–99)
Triglycerides: 187 mg/dL — ABNORMAL HIGH (ref 0–149)
VLDL Cholesterol Cal: 31 mg/dL (ref 5–40)

## 2023-06-07 LAB — HEMOGLOBIN A1C
Est. average glucose Bld gHb Est-mCnc: 126 mg/dL
Hgb A1c MFr Bld: 6 % — ABNORMAL HIGH (ref 4.8–5.6)

## 2023-06-07 LAB — TSH: TSH: 4.28 u[IU]/mL (ref 0.450–4.500)

## 2023-06-07 LAB — VITAMIN D 25 HYDROXY (VIT D DEFICIENCY, FRACTURES): Vit D, 25-Hydroxy: 65.1 ng/mL (ref 30.0–100.0)

## 2023-06-08 ENCOUNTER — Ambulatory Visit: Payer: Medicare PPO

## 2023-06-08 DIAGNOSIS — Z0000 Encounter for general adult medical examination without abnormal findings: Secondary | ICD-10-CM

## 2023-06-08 NOTE — Patient Instructions (Signed)
 Ms. Dines , Thank you for taking time out of your busy schedule to complete your Annual Wellness Visit with me. I enjoyed our conversation and look forward to speaking with you again next year. I, as well as your care team,  appreciate your ongoing commitment to your health goals. Please review the following plan we discussed and let me know if I can assist you in the future. Your Game plan/ To Do List    Referrals: If you haven't heard from the office you've been referred to, please reach out to them at the phone provided.  N/a Follow up Visits: Next Medicare AWV with our clinical staff: 07/04/2024 at 10:10   Have you seen your provider in the last 6 months (3 months if uncontrolled diabetes)? Yes Next Office Visit with your provider: 06/13/2023 at 10:10  Clinician Recommendations:  Aim for 30 minutes of exercise or brisk walking, 6-8 glasses of water, and 5 servings of fruits and vegetables each day.       This is a list of the screening recommended for you and due dates:  Health Maintenance  Topic Date Due   COVID-19 Vaccine (4 - 2024-25 season) 09/11/2022   Flu Shot  08/11/2023   Cologuard (Stool DNA test)  06/01/2024   Medicare Annual Wellness Visit  06/07/2024   Mammogram  07/19/2024   DTaP/Tdap/Td vaccine (3 - Td or Tdap) 10/04/2032   Pneumonia Vaccine  Completed   DEXA scan (bone density measurement)  Completed   Hepatitis C Screening  Completed   Zoster (Shingles) Vaccine  Completed   HPV Vaccine  Aged Out   Meningitis B Vaccine  Aged Out   Colon Cancer Screening  Discontinued    Advanced directives: (Copy Requested) Please bring a copy of your health care power of attorney and living will to the office to be added to your chart at your convenience. You can mail to [facility] [address] [place], [place] [address] or email to [email or web address] Advance Care Planning is important because it:  [x]  Makes sure you receive the medical care that is  consistent with your values, goals, and preferences  [x]  It provides guidance to your family and loved ones and reduces their decisional burden about whether or not they are making the right decisions based on your wishes.  Follow the link provided in your after visit summary or read over the paperwork we have mailed to you to help you started getting your Advance Directives in place. If you need assistance in completing these, please reach out to us  so that we can help you!  See attachments for Preventive Care and Fall Prevention Tips.

## 2023-06-08 NOTE — Progress Notes (Addendum)
 Subjective:   Paula Conway is a 68 y.o. who presents for a Medicare Wellness preventive visit.  As a reminder, Annual Wellness Visits don't include a physical exam, and some assessments may be limited, especially if this visit is performed virtually. We may recommend an in-person follow-up visit with your provider if needed.  Visit Complete: Virtual I connected with  Paula Conway on 06/08/23 by a audio enabled telemedicine application and verified that I am speaking with the correct person using two identifiers.  Patient Location: Home  Provider Location: Home Office  I discussed the limitations of evaluation and management by telemedicine. The patient expressed understanding and agreed to proceed.  Vital Signs: Because this visit was a virtual/telehealth visit, some criteria may be missing or patient reported. Any vitals not documented were not able to be obtained and vitals that have been documented are patient reported.  VideoError- Librarian, academic were attempted between this provider and patient, however failed, due to patient having technical difficulties OR patient did not have access to video capability.  We continued and completed visit with audio only.   Persons Participating in Visit: Patient.  AWV Questionnaire: Yes: Patient Medicare AWV questionnaire was completed by the patient on 06/07/2023; I have confirmed that all information answered by patient is correct and no changes since this date.  Cardiac Risk Factors include: dyslipidemia     Objective:     Today's Vitals   06/08/23 0806  PainSc: 2    There is no height or weight on file to calculate BMI.     06/08/2023    8:14 AM 08/09/2022    1:09 PM 07/25/2022    6:09 AM 05/24/2022    9:04 AM 07/10/2019    9:25 AM 02/20/2017    8:00 AM 07/16/2015    3:29 PM  Advanced Directives  Does Patient Have a Medical Advance Directive? Yes Yes Yes No No No No  Type of Special educational needs teacher of [employer] Power of [place];Living will Healthcare Power of [place];Living will      Copy of Healthcare Power of Attorney in Chart?  No - copy requested No - copy requested      Would patient like information on creating a medical advance directive?    No - Patient declined  No - Patient declined No - patient declined information    Current Medications (verified) Outpatient Encounter Medications as of 06/08/2023  Medication Sig   amoxicillin  (AMOXIL ) 500 MG tablet Take 4 tablets by mouth 1 hour prior to dental procedure.   Ascorbic Acid (VITAMIN C) 1000 MG tablet Take 1,000 mg by mouth daily.   Calcium  Carb-Cholecalciferol (CALCIUM  + VITAMIN D3) 600-10 MG-MCG TABS Take 2 tablets by mouth daily.   Carboxymeth-Glyc-Polysorb PF (REFRESH OPTIVE MEGA-3) 0.5-1-0.5 % SOLN Place 1 drop into both eyes as needed (dry eyes).   Cholecalciferol (VITAMIN D3) 5000 units CAPS Take 5,000 Units by mouth daily.   diclofenac Sodium (VOLTAREN) 1 % GEL Apply 1 Application topically 2 (two) times daily as needed (joint pain).   famotidine  (PEPCID ) 20 MG tablet TAKE 1 TABLET (20 MG TOTAL) BY MOUTH AT BEDTIME.   Lactobacillus-Inulin (CULTURELLE DIGESTIVE HEALTH) CAPS Take 1 capsule by mouth daily.   loratadine (CLARITIN) 10 MG tablet Take 10 mg by mouth every other day.   Multiple Vitamin (MULTIVITAMIN) tablet Take 1 tablet by mouth daily.   pregabalin  (LYRICA ) 75 MG capsule TAKE 1 CAPSULE BY MOUTH 2 TIMES DAILY.  rosuvastatin  (CRESTOR ) 5 MG tablet TAKE 1 TABLET (5 MG TOTAL) BY MOUTH AT BEDTIME.   Turmeric (QC TUMERIC COMPLEX PO) Take 1 tablet by mouth 2 (two) times a week.   omeprazole (PRILOSEC) 20 MG capsule Take 20 mg by mouth daily.   Probiotic Product (PROBIOTIC PO) Take 1 tablet by mouth daily.   No facility-administered encounter medications on file as of 06/08/2023.    Allergies (verified) Patient has no known allergies.   History: Past Medical History:  Diagnosis Date    Allergy    Arthritis    Complication of anesthesia    waking up during colonoscopy and difficulty waking up   Environmental and seasonal allergies 07/20/2015   Family history of adverse reaction to anesthesia    pt's mother and daughter have difficulty waking up   Family history of breast cancer in Mother 07/20/2015   Mother was diagnosed at 71 years old and died age 21 of breast cancer    GERD (gastroesophageal reflux disease)    Headache    History of kidney stones    Hyperlipidemia    Hypertension    stressed induced   Obesity 07/20/2015   Pre-diabetes    Spinal stenosis    Past Surgical History:  Procedure Laterality Date   APPENDECTOMY     BREAST BIOPSY Right    COLONOSCOPY     core biopsy     TOTAL KNEE ARTHROPLASTY Right 07/25/2022   Procedure: RIGHT TOTAL KNEE ARTHROPLASTY;  Surgeon: Wes Hamman, MD;  Location: [facility] OR;  Service: Orthopedics;  Laterality: Right;   VAGINA SURGERY     after having a baby   Family History  Problem Relation Age of Onset   Breast cancer Mother    Diabetes Father    Hypertension Father    Heart disease Father    Social History   Socioeconomic History   Marital status: Widowed    Spouse name: Not on file   Number of children: 2   Years of education: Not on file   Highest education level: Some college, no degree  Occupational History   Not on file  Tobacco Use   Smoking status: Former    Current packs/day: 0.00    Average packs/day: 0.3 packs/day for 3.0 years (0.8 ttl pk-yrs)    Types: Cigarettes    Start date: 01/11/1972    Quit date: 01/11/1975    Years since quitting: 48.4    Passive exposure: Past   Smokeless tobacco: Never  Vaping Use   Vaping status: Never Used  Substance and Sexual Activity   Alcohol use: Not Currently    Comment: ocassionally   Drug use: No   Sexual activity: Yes  Other Topics Concern   Not on file  Social History Narrative   Not on file   Social Drivers of Health   Financial Resource  Strain: Low Risk  (06/08/2023)   Overall Financial Resource Strain (CARDIA)    Difficulty of Paying Living Expenses: Not hard at all  Food Insecurity: No Food Insecurity (06/08/2023)   Hunger Vital Sign    Worried About Running Out of Food in the Last Year: Never true    Ran Out of Food in the Last Year: Never true  Transportation Needs: No Transportation Needs (06/08/2023)   PRAPARE - Administrator, Civil Service (Medical): No    Lack of Transportation (Non-Medical): No  Physical Activity: Inactive (06/08/2023)   Exercise Vital Sign    Days of Exercise  per Week: 0 days    Minutes of Exercise per Session: 0 min  Stress: No Stress Concern Present (06/08/2023)   [employer] of Occupational Health - Occupational Stress Questionnaire    Feeling of Stress : Not at all  Social Connections: Socially Isolated (06/08/2023)   Social Connection and Isolation Panel [NHANES]    Frequency of Communication with Friends and Family: Three times a week    Frequency of Social Gatherings with Friends and Family: Once a week    Attends Religious Services: Never    Database administrator or Organizations: No    Attends Banker Meetings: Never    Marital Status: Widowed    Tobacco Counseling Counseling given: Not Answered    Clinical Intake:  Pre-visit preparation completed: Yes  Pain : 0-10 Pain Score: 2  Pain Type: Chronic pain Pain Location: Generalized Pain Descriptors / Indicators: Aching Pain Onset: More than a month ago Pain Frequency: Constant     Nutritional Risks: None Diabetes: No  Lab Results  Component Value Date   HGBA1C 6.0 (H) 06/06/2023   HGBA1C 6.4 (H) 12/26/2022   HGBA1C 6.2 (H) 07/04/2022     How often do you need to have someone help you when you read instructions, pamphlets, or other written materials from your doctor or pharmacy?: 1 - Never  Interpreter Needed?: No  Information entered by :: NAllen LPN   Activities of Daily  Living     06/07/2023    8:57 PM 07/11/2022    9:43 AM  In your present state of health, do you have any difficulty performing the following activities:  Hearing? 0   Vision? 0   Difficulty concentrating or making decisions? 0   Walking or climbing stairs? 0   Dressing or bathing? 0   Doing errands, shopping? 0 0  Preparing Food and eating ? N   Using the Toilet? N   In the past six months, have you accidently leaked urine? Y   Do you have problems with loss of bowel control? N   Managing your Medications? N   Managing your Finances? N   Housekeeping or managing your Housekeeping? N     Patient Care Team: Melene Sportsman as PCP - General (Physician Assistant)  Indicate any recent Medical Services you may have received from other than [facility] providers in the past year (date may be approximate).     Assessment:    This is a routine wellness examination for [place].  Hearing/Vision screen Hearing Screening - Comments:: Denies hearing issues Vision Screening - Comments:: Regular eye exams, Dr. Myrla Asp    Goals Addressed             This Visit's Progress    Patient Stated       06/08/2023, take one day at a time       Depression Screen     06/08/2023    8:18 AM 01/10/2023    1:58 PM 08/03/2022   11:12 AM 05/31/2022   10:33 AM 05/24/2022    8:57 AM 11/17/2021    9:08 AM 05/17/2021   10:08 AM  PHQ 2/9 Scores  PHQ - 2 Score 0 0 0 1 0 0 0  PHQ- 9 Score 4 7 5 7  3 6     Fall Risk     06/07/2023    8:57 PM 01/10/2023    1:57 PM 05/24/2022    9:03 AM 05/17/2022    6:08 PM 11/17/2021  9:08 AM  Fall Risk   Falls in the past year? 1 0 0 0   Comment tripped over grandson      Number falls in past yr: 0  0 0 0  Injury with Fall? 0  0 0 0  Risk for fall due to : Medication side effect No Fall Risks No Fall Risks    Follow up Falls evaluation completed;Falls prevention discussed Falls evaluation completed Falls prevention discussed      MEDICARE RISK AT HOME:   Medicare Risk at Home Any stairs in or around the home?: (Patient-Rptd) Yes If so, are there any without handrails?: (Patient-Rptd) No Home free of loose throw rugs in walkways, pet beds, electrical cords, etc?: (Patient-Rptd) Yes Adequate lighting in your home to reduce risk of falls?: (Patient-Rptd) Yes Life alert?: (Patient-Rptd) No Use of a cane, walker or w/c?: (Patient-Rptd) No Grab bars in the bathroom?: (Patient-Rptd) No Shower chair or bench in shower?: (Patient-Rptd) Yes Elevated toilet seat or a handicapped toilet?: (Patient-Rptd) No  TIMED UP AND GO:  Was the test performed?  No  Cognitive Function: 6CIT completed        06/08/2023    8:19 AM 05/24/2022    9:04 AM 05/17/2021   10:10 AM  6CIT Screen  What Year? 0 points 0 points 0 points  What month? 0 points 0 points 0 points  What time? 0 points 0 points 0 points  Count back from 20 0 points 0 points 0 points  Months in reverse 0 points 0 points 0 points  Repeat phrase 0 points 0 points 0 points  Total Score 0 points 0 points 0 points    Immunizations Immunization History  Administered Date(s) Administered   PFIZER(Purple Top)SARS-COV-2 Vaccination 03/28/2019, 04/18/2019, 12/10/2019   PNEUMOCOCCAL CONJUGATE-20 10/29/2020   Pneumococcal-Unspecified 01/14/2021   Tdap 10/28/2011, 10/05/2022   Zoster Recombinant(Shingrix) 10/16/2020, 01/14/2021    Screening Tests Health Maintenance  Topic Date Due   COVID-19 Vaccine (4 - 2024-25 season) 09/11/2022   INFLUENZA VACCINE  08/11/2023   Fecal DNA (Cologuard)  06/01/2024   Medicare Annual Wellness (AWV)  06/07/2024   MAMMOGRAM  07/19/2024   DTaP/Tdap/Td (3 - Td or Tdap) 10/04/2032   Pneumonia Vaccine 38+ Years old  Completed   DEXA SCAN  Completed   Hepatitis C Screening  Completed   Zoster Vaccines- Shingrix  Completed   HPV VACCINES  Aged Out   Meningococcal B Vaccine  Aged Out   Colonoscopy  Discontinued    Health Maintenance  Health Maintenance Due   Topic Date Due   COVID-19 Vaccine (4 - 2024-25 season) 09/11/2022   Health Maintenance Items Addressed: Due for covid vaccine.  Additional Screening:  Vision Screening: Recommended annual ophthalmology exams for early detection of glaucoma and other disorders of the eye.  Dental Screening: Recommended annual dental exams for proper oral hygiene  Community Resource Referral / Chronic Care Management: CRR required this visit?  No   CCM required this visit?  No   Plan:    I have personally reviewed and noted the following in the patient's chart:   Medical and social history Use of alcohol, tobacco or illicit drugs  Current medications and supplements including opioid prescriptions. Patient is not currently taking opioid prescriptions. Functional ability and status Nutritional status Physical activity Advanced directives List of other physicians Hospitalizations, surgeries, and ER visits in previous 12 months Vitals Screenings to include cognitive, depression, and falls Referrals and appointments  In addition, I have reviewed  and discussed with patient certain preventive protocols, quality metrics, and best practice recommendations. A written personalized care plan for preventive services as well as general preventive health recommendations were provided to patient.   Areatha Beecham, LPN   1/61/0960   After Visit Summary: (MyChart) Due to this being a telephonic visit, the after visit summary with patients personalized plan was offered to patient via MyChart   Notes: Nothing significant to report at this time.

## 2023-06-12 ENCOUNTER — Ambulatory Visit: Payer: Medicare PPO | Admitting: Family Medicine

## 2023-06-13 ENCOUNTER — Encounter: Payer: Self-pay | Admitting: Family Medicine

## 2023-06-13 ENCOUNTER — Ambulatory Visit (INDEPENDENT_AMBULATORY_CARE_PROVIDER_SITE_OTHER): Admitting: Family Medicine

## 2023-06-13 VITALS — BP 127/75 | HR 63 | Ht 64.0 in | Wt 195.0 lb

## 2023-06-13 DIAGNOSIS — M4800 Spinal stenosis, site unspecified: Secondary | ICD-10-CM

## 2023-06-13 DIAGNOSIS — R7303 Prediabetes: Secondary | ICD-10-CM | POA: Diagnosis not present

## 2023-06-13 DIAGNOSIS — M8580 Other specified disorders of bone density and structure, unspecified site: Secondary | ICD-10-CM | POA: Diagnosis not present

## 2023-06-13 DIAGNOSIS — E782 Mixed hyperlipidemia: Secondary | ICD-10-CM

## 2023-06-13 NOTE — Progress Notes (Signed)
   Established Patient Office Visit  Subjective   Patient ID: Paula Conway, female    DOB: [birth year]  Age: 68 y.o. MRN: [id number]  Chief Complaint  Patient presents with   Medical Management of Chronic Issues    HPI  Subjective - Follow up for cholesterol and prediabetes - Reports taking rosuvastatin  with good tolerance - No dietary changes since last visit - Taking vitamin D  5000 units daily, previously took 50,000 units weekly - Taking calcium  for osteopenia diagnosed in January - Reports spinal stenosis managed with Lyrica  (previously on gabapentin) - Denies interest in back surgery, states can tolerate current pain level - Previously received spinal injections but discontinued due to concerns about contaminated injections - Previously evaluated by orthopedist at [facility] who indicated no further treatment options besides surgery  Medications: rosuvastatin  for cholesterol, Lyrica  for spinal stenosis, vitamin D  5000 units daily, calcium  supplement for osteopenia  PMH: prediabetes, osteopenia diagnosed January 2025, spinal stenosis, history of elevated TSH PSH: none mentioned FH: mother had breast cancer Social Hx: 29 years old  ROS: denies medication side effects   The 10-year ASCVD risk score (Arnett DK, et al., 2019) is: 7%  Health Maintenance Due  Topic Date Due   COVID-19 Vaccine (4 - 2024-25 season) 09/11/2022      Objective:     BP 127/75   Pulse 63   Ht 5\' 4"  (1.626 m)   Wt 195 lb (88.5 kg)   SpO2 99%   BMI 33.47 kg/m    Physical Exam General: Alert, oriented Pulmonary: No respiratory distress Psych: Pleasant affect   No results found for any visits on 06/13/23.      Assessment & Plan:   Mixed hyperlipidemia Assessment & Plan:    History of hyperlipidemia managed with rosuvastatin . Current LDL 80, below goal of <100.    - Continue rosuvastatin  at current dose   Prediabetes Assessment & Plan:    History of one diabetic-range A1C  reading in the past, now improved from 6.4 to 6.0.    - Continue current management    - Emphasized importance of keeping A1C below diabetic range   Osteopenia, unspecified location Assessment & Plan:    Diagnosed January 2025 on bone density scan.    - Continue calcium  and vitamin D  supplementation   Spinal stenosis, unspecified spinal region Assessment & Plan:    Managed with Lyrica , previously on gabapentin. Previously evaluated by orthopedist who indicated no further options besides surgery. Declines surgical intervention.    - Continue Lyrica       Return in about 9 months (around 03/12/2024) for physical.     Laneta Pintos, MD

## 2023-06-13 NOTE — Assessment & Plan Note (Signed)
 Managed with Lyrica , previously on gabapentin. Previously evaluated by orthopedist who indicated no further options besides surgery. Declines surgical intervention.    - Continue Lyrica 

## 2023-06-13 NOTE — Assessment & Plan Note (Signed)
 Diagnosed January 2025 on bone density scan.    - Continue calcium  and vitamin D  supplementation

## 2023-06-13 NOTE — Assessment & Plan Note (Signed)
 History of hyperlipidemia managed with rosuvastatin . Current LDL 80, below goal of <100.    - Continue rosuvastatin  at current dose

## 2023-06-13 NOTE — Assessment & Plan Note (Signed)
 History of one diabetic-range A1C reading in the past, now improved from 6.4 to 6.0.    - Continue current management    - Emphasized importance of keeping A1C below diabetic range

## 2023-06-13 NOTE — Patient Instructions (Signed)
 It was nice to see you today,  We addressed the following topics today: -Your labs look good.  Your A1c has improved.  Your cholesterol is under control. - We will follow-up in the next 9 to 12 months for your physical and recheck your labs.  No changes to any your medications.  Have a great day,  Etha Henle, MD

## 2023-06-26 ENCOUNTER — Other Ambulatory Visit: Payer: Self-pay

## 2023-06-26 DIAGNOSIS — Z1231 Encounter for screening mammogram for malignant neoplasm of breast: Secondary | ICD-10-CM

## 2023-07-18 ENCOUNTER — Ambulatory Visit: Payer: Medicare PPO | Admitting: Orthopaedic Surgery

## 2023-07-25 ENCOUNTER — Ambulatory Visit: Admitting: Orthopaedic Surgery

## 2023-07-25 ENCOUNTER — Ambulatory Visit
Admission: RE | Admit: 2023-07-25 | Discharge: 2023-07-25 | Disposition: A | Discharge status: Home / Self Care | Source: Ambulatory Visit

## 2023-07-25 ENCOUNTER — Ambulatory Visit (INDEPENDENT_AMBULATORY_CARE_PROVIDER_SITE_OTHER): Payer: Self-pay

## 2023-07-25 DIAGNOSIS — M25562 Pain in left knee: Secondary | ICD-10-CM

## 2023-07-25 DIAGNOSIS — Z1231 Encounter for screening mammogram for malignant neoplasm of breast: Secondary | ICD-10-CM

## 2023-07-25 DIAGNOSIS — Z96651 Presence of right artificial knee joint: Secondary | ICD-10-CM

## 2023-07-25 DIAGNOSIS — G8929 Other chronic pain: Secondary | ICD-10-CM | POA: Insufficient documentation

## 2023-07-25 NOTE — Progress Notes (Signed)
 Office Visit Note   Patient: Paula Conway           Date of Birth: January 04, 1956           MRN: [id number] Visit Date: 07/25/2023              Requested by: Wallace Joesph LABOR, PA [address] [place],  [place] [address] PCP: Gayle Saddie FALCON, PA-C   Assessment & Plan: Visit Diagnoses:  1. Status post total right knee replacement   2. Chronic pain of left knee     Plan: History of Present Illness The patient presents for a one-year follow-up after right total knee replacement.  She experiences significant improvement in her right knee post-surgery but occasionally has discomfort, especially in the mornings when descending stairs. She is relearning her walking pattern and sometimes uses an antalgic gait due to previous knee pain, which she is trying to correct. She still pampers the right knee, affecting her left knee.  She has concerns about her balance, particularly when turning quickly, and has returned to exercise classes but finds it difficult to stay on her knees for extended periods due to pain. Joint stiffness occurs after activities like yoga.  She experiences left knee pain that is bothersome but manageable with Tylenol , Advil, and activity modifications. She is considering injections for future flare-ups.  Physical Exam MUSCULOSKELETAL: Right knee replacement stable, well bonded, well positioned, no complications. Left knee with mild arthritic changes. Right knee with excellent flexibility and normal side to side movement.  Results RADIOLOGY Right knee X-ray: Stable right total knee arthroplasty without complications (07/25/2023) Left knee X-ray: Mild varus deformity, tricompartmental osteoarthritic changes (07/25/2023)  Assessment and Plan Right total knee replacement One year post-surgery with well-bonded and positioned prosthesis. Improved function noted, balance and gait issues likely from pre-surgery compensatory mechanisms. - Schedule follow-up visit in one year for  two-year post-operative evaluation. - Encourage continued activity and exercises to improve balance and gait.  Left knee osteoarthritis Mild varus deformity with tricompartmental arthritic changes causing pain, managed with OTC medications and activity modifications. - Advise to continue using Tylenol  or Advil for pain management. - Recommend activity modifications to manage symptoms.  Spinal stenosis Balance issues possibly due to spinal stenosis, age, or other factors, causing discomfort and functional limitations. - Encourage continued participation in exercise classes and yoga to maintain mobility and strength.  Follow-Up Instructions: Return in about 1 year (around 07/24/2024).   Orders:  Orders Placed This Encounter  Procedures   XR Knee 1-2 Views Right   XR KNEE 3 VIEW LEFT   No orders of the defined types were placed in this encounter.     Procedures: No procedures performed   Clinical Data: No additional findings.   Subjective: Chief Complaint  Patient presents with   Right Knee - Pain   Left Knee - Pain    HPI  Review of Systems  Constitutional: Negative.   HENT: Negative.    Eyes: Negative.   Respiratory: Negative.    Cardiovascular: Negative.   Endocrine: Negative.   Musculoskeletal: Negative.   Neurological: Negative.   Hematological: Negative.   Psychiatric/Behavioral: Negative.    All other systems reviewed and are negative.    Objective: Vital Signs: There were no vitals taken for this visit.  Physical Exam Vitals and nursing note reviewed.  Constitutional:      Appearance: She is well-developed.  HENT:     Head: Atraumatic.     Nose: Nose normal.  Eyes:  Extraocular Movements: Extraocular movements intact.  Cardiovascular:     Pulses: Normal pulses.  Pulmonary:     Effort: Pulmonary effort is normal.  Abdominal:     Palpations: Abdomen is soft.  Musculoskeletal:     Cervical back: Neck supple.  Skin:    General: Skin is  warm.     Capillary Refill: Capillary refill takes less than 2 seconds.  Neurological:     Mental Status: She is alert. Mental status is at baseline.  Psychiatric:        Behavior: Behavior normal.        Thought Content: Thought content normal.        Judgment: Judgment normal.     Ortho Exam  Specialty Comments:  No specialty comments available.  Imaging: XR KNEE 3 VIEW LEFT Result Date: 07/25/2023 X-rays of the left knee show mild varus deformity.  Tricompartment arthritic changes.  XR Knee 1-2 Views Right Result Date: 07/25/2023 X-rays of the right knee show a stable right total knee replacement without any complications.    PMFS History: Patient Active Problem List   Diagnosis Date Noted   Chronic pain of left knee 07/25/2023   Osteopenia 06/13/2023   Primary osteoarthritis of right knee 07/24/2022   Bilateral primary osteoarthritis of knee 08/12/2021   Status post total right knee replacement 08/05/2020   Grief reaction- loss of husband [place] Aug 8th, 2020 10/02/2018   Dysfunction of both eustachian tubes 07/24/2017   BPPV (benign paroxysmal positional vertigo), unspecified laterality 07/24/2017   Vitamin D  insufficiency 03/15/2017   Lumbar discogenic pain syndrome 02/15/2017   Radiculopathy, lumbar region 02/15/2017   Arthritis of facet joints at multiple vertebral levels 02/15/2017   Low back pain 02/15/2017   Obesity, Class I, BMI 30-34.9 08/30/2016   Prediabetes 07/30/2015   Family history of breast cancer in Mother 07/20/2015   GERD (gastroesophageal reflux disease) 07/20/2015   Generalized OA 07/20/2015   Hyperlipidemia 07/20/2015   Environmental and seasonal allergies 07/20/2015   Spinal stenosis: Chronic back pain  07/20/2015   Neoplasm of connective tissue 11/23/2011   Past Medical History:  Diagnosis Date   Allergy    Arthritis    Complication of anesthesia    waking up during colonoscopy and difficulty waking up   Environmental and seasonal  allergies 07/20/2015   Family history of adverse reaction to anesthesia    pt's mother and daughter have difficulty waking up   Family history of breast cancer in Mother 07/20/2015   Mother was diagnosed at 70 years old and died age 8 of breast cancer    GERD (gastroesophageal reflux disease)    Headache    History of kidney stones    Hyperlipidemia    Hypertension    stressed induced   Obesity 07/20/2015   Pre-diabetes    Spinal stenosis     Family History  Problem Relation Age of Onset   Breast cancer Mother    Diabetes Father    Hypertension Father    Heart disease Father     Past Surgical History:  Procedure Laterality Date   APPENDECTOMY     BREAST BIOPSY Right    COLONOSCOPY     core biopsy     TOTAL KNEE ARTHROPLASTY Right 07/25/2022   Procedure: RIGHT TOTAL KNEE ARTHROPLASTY;  Surgeon: Jerri Kay HERO, MD;  Location: [facility] OR;  Service: Orthopedics;  Laterality: Right;   VAGINA SURGERY     after having a baby   Social History  Occupational History   Not on file  Tobacco Use   Smoking status: Former    Current packs/day: 0.00    Average packs/day: 0.3 packs/day for 3.0 years (0.8 ttl pk-yrs)    Types: Cigarettes    Start date: 01/11/1972    Quit date: 01/11/1975    Years since quitting: 48.5    Passive exposure: Past   Smokeless tobacco: Never  Vaping Use   Vaping status: Never Used  Substance and Sexual Activity   Alcohol use: Not Currently    Comment: ocassionally   Drug use: No   Sexual activity: Yes

## 2023-08-23 ENCOUNTER — Other Ambulatory Visit: Payer: Self-pay | Admitting: Family Medicine

## 2023-08-23 DIAGNOSIS — M4800 Spinal stenosis, site unspecified: Secondary | ICD-10-CM

## 2023-10-18 DIAGNOSIS — E669 Obesity, unspecified: Secondary | ICD-10-CM | POA: Diagnosis not present

## 2023-10-18 DIAGNOSIS — K219 Gastro-esophageal reflux disease without esophagitis: Secondary | ICD-10-CM | POA: Diagnosis not present

## 2023-10-18 DIAGNOSIS — M1990 Unspecified osteoarthritis, unspecified site: Secondary | ICD-10-CM | POA: Diagnosis not present

## 2023-10-18 DIAGNOSIS — R32 Unspecified urinary incontinence: Secondary | ICD-10-CM | POA: Diagnosis not present

## 2023-10-18 DIAGNOSIS — F329 Major depressive disorder, single episode, unspecified: Secondary | ICD-10-CM | POA: Diagnosis not present

## 2023-10-18 DIAGNOSIS — M4800 Spinal stenosis, site unspecified: Secondary | ICD-10-CM | POA: Diagnosis not present

## 2023-10-18 DIAGNOSIS — M545 Low back pain, unspecified: Secondary | ICD-10-CM | POA: Diagnosis not present

## 2023-10-18 DIAGNOSIS — E785 Hyperlipidemia, unspecified: Secondary | ICD-10-CM | POA: Diagnosis not present

## 2023-10-18 DIAGNOSIS — I129 Hypertensive chronic kidney disease with stage 1 through stage 4 chronic kidney disease, or unspecified chronic kidney disease: Secondary | ICD-10-CM | POA: Diagnosis not present

## 2023-11-13 ENCOUNTER — Encounter: Payer: Self-pay | Admitting: Radiology

## 2024-01-24 ENCOUNTER — Other Ambulatory Visit (HOSPITAL_BASED_OUTPATIENT_CLINIC_OR_DEPARTMENT_OTHER): Payer: Self-pay | Admitting: Physical Medicine and Rehabilitation

## 2024-01-24 ENCOUNTER — Ambulatory Visit (HOSPITAL_BASED_OUTPATIENT_CLINIC_OR_DEPARTMENT_OTHER)
Admission: RE | Admit: 2024-01-24 | Discharge: 2024-01-24 | Disposition: A | Discharge status: Home / Self Care | Source: Ambulatory Visit | Attending: Physical Medicine and Rehabilitation | Admitting: Physical Medicine and Rehabilitation

## 2024-01-24 DIAGNOSIS — M545 Low back pain, unspecified: Secondary | ICD-10-CM | POA: Diagnosis present

## 2024-03-29 ENCOUNTER — Other Ambulatory Visit

## 2024-04-04 ENCOUNTER — Other Ambulatory Visit

## 2024-04-12 ENCOUNTER — Encounter

## 2024-04-16 ENCOUNTER — Encounter

## 2024-07-04 ENCOUNTER — Ambulatory Visit

## 2024-07-24 ENCOUNTER — Ambulatory Visit: Admitting: Orthopaedic Surgery
